# Patient Record
Sex: Male | Born: 1945 | Race: Black or African American | Hispanic: No | Marital: Married | Smoking: Former smoker
Health system: Southern US, Community
[De-identification: ages and names within clinical notes are randomized; demographics above are authoritative.]

## PROBLEM LIST (undated history)

## (undated) DIAGNOSIS — I10 Essential (primary) hypertension: Secondary | ICD-10-CM

## (undated) DIAGNOSIS — E119 Type 2 diabetes mellitus without complications: Secondary | ICD-10-CM

## (undated) DIAGNOSIS — E785 Hyperlipidemia, unspecified: Secondary | ICD-10-CM

## (undated) HISTORY — DX: Essential (primary) hypertension: I10

## (undated) HISTORY — DX: Hyperlipidemia, unspecified: E78.5

## (undated) HISTORY — DX: Type 2 diabetes mellitus without complications: E11.9

---

## 2012-07-04 ENCOUNTER — Emergency Department: Payer: Self-pay | Admitting: Emergency Medicine

## 2012-07-04 LAB — CBC
HCT: 38.5 % — ABNORMAL LOW (ref 40.0–52.0)
HGB: 12.9 g/dL — ABNORMAL LOW (ref 13.0–18.0)
MCH: 25.5 pg — ABNORMAL LOW (ref 26.0–34.0)
Platelet: 256 10*3/uL (ref 150–440)
RDW: 16.2 % — ABNORMAL HIGH (ref 11.5–14.5)

## 2012-09-03 ENCOUNTER — Encounter: Payer: Self-pay | Admitting: Nurse Practitioner

## 2012-09-03 ENCOUNTER — Encounter: Payer: Self-pay | Admitting: Cardiothoracic Surgery

## 2012-09-04 ENCOUNTER — Encounter: Payer: Self-pay | Admitting: Nurse Practitioner

## 2012-09-04 ENCOUNTER — Encounter: Payer: Self-pay | Admitting: Cardiothoracic Surgery

## 2012-10-02 ENCOUNTER — Encounter: Payer: Self-pay | Admitting: Nurse Practitioner

## 2012-10-02 ENCOUNTER — Encounter: Payer: Self-pay | Admitting: Cardiothoracic Surgery

## 2012-11-02 ENCOUNTER — Encounter: Payer: Self-pay | Admitting: Nurse Practitioner

## 2012-11-02 ENCOUNTER — Encounter: Payer: Self-pay | Admitting: Cardiothoracic Surgery

## 2012-11-14 LAB — WOUND CULTURE

## 2012-12-02 ENCOUNTER — Encounter: Payer: Self-pay | Admitting: Cardiothoracic Surgery

## 2012-12-02 ENCOUNTER — Encounter: Payer: Self-pay | Admitting: Nurse Practitioner

## 2013-01-02 ENCOUNTER — Encounter: Payer: Self-pay | Admitting: Nurse Practitioner

## 2013-01-02 ENCOUNTER — Encounter: Payer: Self-pay | Admitting: Cardiothoracic Surgery

## 2013-02-01 ENCOUNTER — Encounter: Payer: Self-pay | Admitting: Nurse Practitioner

## 2013-02-01 ENCOUNTER — Encounter: Payer: Self-pay | Admitting: Cardiothoracic Surgery

## 2013-03-04 ENCOUNTER — Encounter: Payer: Self-pay | Admitting: Cardiothoracic Surgery

## 2014-06-25 ENCOUNTER — Emergency Department: Payer: Self-pay | Admitting: Emergency Medicine

## 2014-07-10 ENCOUNTER — Encounter: Payer: Self-pay | Admitting: Surgery

## 2014-08-04 ENCOUNTER — Encounter: Payer: Self-pay | Admitting: Surgery

## 2014-08-10 DIAGNOSIS — I87311 Chronic venous hypertension (idiopathic) with ulcer of right lower extremity: Secondary | ICD-10-CM | POA: Diagnosis not present

## 2014-08-17 ENCOUNTER — Encounter: Payer: Self-pay | Admitting: Surgery

## 2014-08-17 DIAGNOSIS — M79609 Pain in unspecified limb: Secondary | ICD-10-CM | POA: Diagnosis not present

## 2014-08-17 DIAGNOSIS — L97309 Non-pressure chronic ulcer of unspecified ankle with unspecified severity: Secondary | ICD-10-CM | POA: Diagnosis not present

## 2014-08-17 DIAGNOSIS — I87311 Chronic venous hypertension (idiopathic) with ulcer of right lower extremity: Secondary | ICD-10-CM | POA: Diagnosis not present

## 2014-08-17 DIAGNOSIS — M7989 Other specified soft tissue disorders: Secondary | ICD-10-CM | POA: Diagnosis not present

## 2014-08-17 DIAGNOSIS — E11622 Type 2 diabetes mellitus with other skin ulcer: Secondary | ICD-10-CM | POA: Diagnosis not present

## 2014-08-17 DIAGNOSIS — I872 Venous insufficiency (chronic) (peripheral): Secondary | ICD-10-CM | POA: Diagnosis not present

## 2014-08-17 DIAGNOSIS — I890 Lymphedema, not elsewhere classified: Secondary | ICD-10-CM | POA: Diagnosis not present

## 2014-08-17 DIAGNOSIS — X35XXXA Volcanic eruption, initial encounter: Secondary | ICD-10-CM | POA: Diagnosis not present

## 2014-08-24 DIAGNOSIS — I87311 Chronic venous hypertension (idiopathic) with ulcer of right lower extremity: Secondary | ICD-10-CM | POA: Diagnosis not present

## 2014-08-30 ENCOUNTER — Emergency Department: Payer: Self-pay | Admitting: Emergency Medicine

## 2014-08-30 DIAGNOSIS — Z7982 Long term (current) use of aspirin: Secondary | ICD-10-CM | POA: Diagnosis not present

## 2014-08-30 DIAGNOSIS — I10 Essential (primary) hypertension: Secondary | ICD-10-CM | POA: Diagnosis not present

## 2014-08-30 DIAGNOSIS — R079 Chest pain, unspecified: Secondary | ICD-10-CM | POA: Diagnosis not present

## 2014-08-30 DIAGNOSIS — Z87891 Personal history of nicotine dependence: Secondary | ICD-10-CM | POA: Diagnosis not present

## 2014-08-30 DIAGNOSIS — R002 Palpitations: Secondary | ICD-10-CM | POA: Diagnosis not present

## 2014-08-30 DIAGNOSIS — E119 Type 2 diabetes mellitus without complications: Secondary | ICD-10-CM | POA: Diagnosis not present

## 2014-08-30 DIAGNOSIS — I455 Other specified heart block: Secondary | ICD-10-CM | POA: Diagnosis not present

## 2014-08-30 DIAGNOSIS — R0602 Shortness of breath: Secondary | ICD-10-CM | POA: Diagnosis not present

## 2014-08-30 DIAGNOSIS — Z79899 Other long term (current) drug therapy: Secondary | ICD-10-CM | POA: Diagnosis not present

## 2014-08-30 LAB — CBC
HCT: 40 % (ref 40.0–52.0)
HGB: 12.5 g/dL — AB (ref 13.0–18.0)
MCH: 23.9 pg — ABNORMAL LOW (ref 26.0–34.0)
MCHC: 31.4 g/dL — AB (ref 32.0–36.0)
MCV: 76 fL — ABNORMAL LOW (ref 80–100)
Platelet: 239 10*3/uL (ref 150–440)
RBC: 5.25 10*6/uL (ref 4.40–5.90)
RDW: 16.7 % — ABNORMAL HIGH (ref 11.5–14.5)
WBC: 8 10*3/uL (ref 3.8–10.6)

## 2014-08-30 LAB — CK TOTAL AND CKMB (NOT AT ARMC)
CK, Total: 82 U/L (ref 39–308)
CK-MB: 1.5 ng/mL (ref 0.5–3.6)

## 2014-08-30 LAB — COMPREHENSIVE METABOLIC PANEL
ALK PHOS: 91 U/L (ref 46–116)
ALT: 18 U/L (ref 14–63)
Albumin: 3.3 g/dL — ABNORMAL LOW (ref 3.4–5.0)
Anion Gap: 8 (ref 7–16)
BUN: 12 mg/dL (ref 7–18)
Bilirubin,Total: 0.4 mg/dL (ref 0.2–1.0)
CHLORIDE: 103 mmol/L (ref 98–107)
CO2: 27 mmol/L (ref 21–32)
CREATININE: 0.96 mg/dL (ref 0.60–1.30)
Calcium, Total: 8.7 mg/dL (ref 8.5–10.1)
Glucose: 198 mg/dL — ABNORMAL HIGH (ref 65–99)
OSMOLALITY: 281 (ref 275–301)
POTASSIUM: 3.5 mmol/L (ref 3.5–5.1)
SGOT(AST): 19 U/L (ref 15–37)
SODIUM: 138 mmol/L (ref 136–145)
Total Protein: 7.6 g/dL (ref 6.4–8.2)

## 2014-08-30 LAB — TROPONIN I

## 2014-08-30 LAB — PROTIME-INR
INR: 1
PROTHROMBIN TIME: 13.4 s (ref 11.5–14.7)

## 2014-08-30 LAB — APTT: ACTIVATED PTT: 30.2 s (ref 23.6–35.9)

## 2014-08-31 DIAGNOSIS — I87311 Chronic venous hypertension (idiopathic) with ulcer of right lower extremity: Secondary | ICD-10-CM | POA: Diagnosis not present

## 2014-09-01 DIAGNOSIS — G4733 Obstructive sleep apnea (adult) (pediatric): Secondary | ICD-10-CM | POA: Diagnosis not present

## 2014-09-04 ENCOUNTER — Encounter: Payer: Self-pay | Admitting: Surgery

## 2014-09-11 DIAGNOSIS — E11620 Type 2 diabetes mellitus with diabetic dermatitis: Secondary | ICD-10-CM | POA: Diagnosis not present

## 2014-09-11 DIAGNOSIS — I87311 Chronic venous hypertension (idiopathic) with ulcer of right lower extremity: Secondary | ICD-10-CM | POA: Diagnosis not present

## 2014-09-18 DIAGNOSIS — E11620 Type 2 diabetes mellitus with diabetic dermatitis: Secondary | ICD-10-CM | POA: Diagnosis not present

## 2014-09-18 DIAGNOSIS — I87311 Chronic venous hypertension (idiopathic) with ulcer of right lower extremity: Secondary | ICD-10-CM | POA: Diagnosis not present

## 2014-09-25 DIAGNOSIS — I87311 Chronic venous hypertension (idiopathic) with ulcer of right lower extremity: Secondary | ICD-10-CM | POA: Diagnosis not present

## 2014-09-25 DIAGNOSIS — E11622 Type 2 diabetes mellitus with other skin ulcer: Secondary | ICD-10-CM | POA: Diagnosis not present

## 2014-09-25 DIAGNOSIS — E11620 Type 2 diabetes mellitus with diabetic dermatitis: Secondary | ICD-10-CM | POA: Diagnosis not present

## 2014-10-02 DIAGNOSIS — E11620 Type 2 diabetes mellitus with diabetic dermatitis: Secondary | ICD-10-CM | POA: Diagnosis not present

## 2014-10-02 DIAGNOSIS — I87311 Chronic venous hypertension (idiopathic) with ulcer of right lower extremity: Secondary | ICD-10-CM | POA: Diagnosis not present

## 2014-10-02 DIAGNOSIS — G4733 Obstructive sleep apnea (adult) (pediatric): Secondary | ICD-10-CM | POA: Diagnosis not present

## 2014-10-03 ENCOUNTER — Encounter
Admit: 2014-10-03 | Disposition: A | Discharge status: Home / Self Care | Payer: Self-pay | Attending: Surgery | Admitting: Surgery

## 2014-10-09 DIAGNOSIS — I87311 Chronic venous hypertension (idiopathic) with ulcer of right lower extremity: Secondary | ICD-10-CM | POA: Diagnosis not present

## 2014-10-09 DIAGNOSIS — I87339 Chronic venous hypertension (idiopathic) with ulcer and inflammation of unspecified lower extremity: Secondary | ICD-10-CM | POA: Diagnosis not present

## 2014-10-09 DIAGNOSIS — E11620 Type 2 diabetes mellitus with diabetic dermatitis: Secondary | ICD-10-CM | POA: Diagnosis not present

## 2014-10-16 DIAGNOSIS — I87311 Chronic venous hypertension (idiopathic) with ulcer of right lower extremity: Secondary | ICD-10-CM | POA: Diagnosis not present

## 2014-10-16 DIAGNOSIS — E11620 Type 2 diabetes mellitus with diabetic dermatitis: Secondary | ICD-10-CM | POA: Diagnosis not present

## 2014-10-19 DIAGNOSIS — L03115 Cellulitis of right lower limb: Secondary | ICD-10-CM | POA: Diagnosis not present

## 2014-10-19 DIAGNOSIS — R011 Cardiac murmur, unspecified: Secondary | ICD-10-CM | POA: Diagnosis not present

## 2014-10-19 DIAGNOSIS — I10 Essential (primary) hypertension: Secondary | ICD-10-CM | POA: Diagnosis not present

## 2014-10-19 DIAGNOSIS — E782 Mixed hyperlipidemia: Secondary | ICD-10-CM | POA: Diagnosis not present

## 2014-10-19 DIAGNOSIS — E1165 Type 2 diabetes mellitus with hyperglycemia: Secondary | ICD-10-CM | POA: Diagnosis not present

## 2014-10-23 DIAGNOSIS — I87311 Chronic venous hypertension (idiopathic) with ulcer of right lower extremity: Secondary | ICD-10-CM | POA: Diagnosis not present

## 2014-10-23 DIAGNOSIS — E11620 Type 2 diabetes mellitus with diabetic dermatitis: Secondary | ICD-10-CM | POA: Diagnosis not present

## 2014-10-30 DIAGNOSIS — E11620 Type 2 diabetes mellitus with diabetic dermatitis: Secondary | ICD-10-CM | POA: Diagnosis not present

## 2014-10-30 DIAGNOSIS — I87311 Chronic venous hypertension (idiopathic) with ulcer of right lower extremity: Secondary | ICD-10-CM | POA: Diagnosis not present

## 2014-11-01 DIAGNOSIS — G4733 Obstructive sleep apnea (adult) (pediatric): Secondary | ICD-10-CM | POA: Diagnosis not present

## 2014-11-03 ENCOUNTER — Encounter
Admit: 2014-11-03 | Disposition: A | Discharge status: Home / Self Care | Payer: Self-pay | Attending: Surgery | Admitting: Surgery

## 2014-11-06 DIAGNOSIS — I87311 Chronic venous hypertension (idiopathic) with ulcer of right lower extremity: Secondary | ICD-10-CM | POA: Diagnosis not present

## 2014-11-06 DIAGNOSIS — E11620 Type 2 diabetes mellitus with diabetic dermatitis: Secondary | ICD-10-CM | POA: Diagnosis not present

## 2014-11-09 DIAGNOSIS — E11620 Type 2 diabetes mellitus with diabetic dermatitis: Secondary | ICD-10-CM | POA: Diagnosis not present

## 2014-11-09 DIAGNOSIS — I87311 Chronic venous hypertension (idiopathic) with ulcer of right lower extremity: Secondary | ICD-10-CM | POA: Diagnosis not present

## 2014-11-13 DIAGNOSIS — E11620 Type 2 diabetes mellitus with diabetic dermatitis: Secondary | ICD-10-CM | POA: Diagnosis not present

## 2014-11-13 DIAGNOSIS — I87311 Chronic venous hypertension (idiopathic) with ulcer of right lower extremity: Secondary | ICD-10-CM | POA: Diagnosis not present

## 2014-11-13 DIAGNOSIS — L97311 Non-pressure chronic ulcer of right ankle limited to breakdown of skin: Secondary | ICD-10-CM | POA: Diagnosis not present

## 2014-11-14 DIAGNOSIS — I87339 Chronic venous hypertension (idiopathic) with ulcer and inflammation of unspecified lower extremity: Secondary | ICD-10-CM | POA: Diagnosis not present

## 2014-11-16 DIAGNOSIS — I87311 Chronic venous hypertension (idiopathic) with ulcer of right lower extremity: Secondary | ICD-10-CM | POA: Diagnosis not present

## 2014-11-16 DIAGNOSIS — E11620 Type 2 diabetes mellitus with diabetic dermatitis: Secondary | ICD-10-CM | POA: Diagnosis not present

## 2014-11-20 DIAGNOSIS — I87311 Chronic venous hypertension (idiopathic) with ulcer of right lower extremity: Secondary | ICD-10-CM | POA: Diagnosis not present

## 2014-11-20 DIAGNOSIS — M79609 Pain in unspecified limb: Secondary | ICD-10-CM | POA: Diagnosis not present

## 2014-11-20 DIAGNOSIS — I872 Venous insufficiency (chronic) (peripheral): Secondary | ICD-10-CM | POA: Diagnosis not present

## 2014-11-20 DIAGNOSIS — I890 Lymphedema, not elsewhere classified: Secondary | ICD-10-CM | POA: Diagnosis not present

## 2014-11-20 DIAGNOSIS — L97309 Non-pressure chronic ulcer of unspecified ankle with unspecified severity: Secondary | ICD-10-CM | POA: Diagnosis not present

## 2014-11-20 DIAGNOSIS — E11620 Type 2 diabetes mellitus with diabetic dermatitis: Secondary | ICD-10-CM | POA: Diagnosis not present

## 2014-11-23 DIAGNOSIS — I87311 Chronic venous hypertension (idiopathic) with ulcer of right lower extremity: Secondary | ICD-10-CM | POA: Diagnosis not present

## 2014-11-23 DIAGNOSIS — E11620 Type 2 diabetes mellitus with diabetic dermatitis: Secondary | ICD-10-CM | POA: Diagnosis not present

## 2014-11-27 DIAGNOSIS — E11620 Type 2 diabetes mellitus with diabetic dermatitis: Secondary | ICD-10-CM | POA: Diagnosis not present

## 2014-11-27 DIAGNOSIS — I87311 Chronic venous hypertension (idiopathic) with ulcer of right lower extremity: Secondary | ICD-10-CM | POA: Diagnosis not present

## 2014-11-27 DIAGNOSIS — L97311 Non-pressure chronic ulcer of right ankle limited to breakdown of skin: Secondary | ICD-10-CM | POA: Diagnosis not present

## 2014-11-30 DIAGNOSIS — I87311 Chronic venous hypertension (idiopathic) with ulcer of right lower extremity: Secondary | ICD-10-CM | POA: Diagnosis not present

## 2014-11-30 DIAGNOSIS — E11620 Type 2 diabetes mellitus with diabetic dermatitis: Secondary | ICD-10-CM | POA: Diagnosis not present

## 2014-12-01 DIAGNOSIS — G4733 Obstructive sleep apnea (adult) (pediatric): Secondary | ICD-10-CM | POA: Diagnosis not present

## 2014-12-04 ENCOUNTER — Encounter: Payer: Medicare Other | Attending: Surgery | Admitting: Surgery

## 2014-12-04 DIAGNOSIS — E11620 Type 2 diabetes mellitus with diabetic dermatitis: Secondary | ICD-10-CM | POA: Insufficient documentation

## 2014-12-04 DIAGNOSIS — L97311 Non-pressure chronic ulcer of right ankle limited to breakdown of skin: Secondary | ICD-10-CM | POA: Diagnosis not present

## 2014-12-04 DIAGNOSIS — I87309 Chronic venous hypertension (idiopathic) without complications of unspecified lower extremity: Secondary | ICD-10-CM | POA: Diagnosis not present

## 2014-12-05 NOTE — Progress Notes (Signed)
Maurice Sanders, Bernhardt ([id number]) Visit Report for 12/04/2014 Chief Complaint Document Details Patient Name: Maurice Sanders, Maurice Sanders Date of Service: 12/04/2014 8:00 AM Medical Record Number: [id number] Patient Account Number: [id number] Date of Birth/Sex: 06/18/1946 (69 y.o. Male) Treating RN: Primary Care Physician: Maurice Sanders Other Clinician: Referring Physician: Daryel NovemberWILLIAMS, Sanders Treating Physician/Extender: Maurice Sanders Sanders in Treatment: 21 Information Obtained from: Patient Chief Complaint Maurice Sanders has been coming for dressing changes twice a week, on Monday and Thursday. All was looking good on Thursday but between Saturday and Sunday he started getting more edema and some shooting pain in his legs. Using the bandage but did not remove it. Electronic Signature(s) Signed: 12/04/2014 1:00:22 PM By: Evlyn KannerBritto, Deann Mclaine MD, FACS Entered By: Evlyn KannerBritto, Ingris Pasquarella on 12/04/2014 08:57:52 Maurice Sanders, Maurice Sanders ([id number]) -------------------------------------------------------------------------------- Debridement Details Patient Name: Maurice Sanders, Maurice Sanders Date of Service: 12/04/2014 8:00 AM Medical Record Number: [id number] Patient Account Number: [id number] Date of Birth/Sex: 02/04/1946 (68 y.o. Male) Treating RN: Primary Care Physician: Maurice Sanders Other Clinician: Referring Physician: Daryel NovemberWILLIAMS, Sanders Treating Physician/Extender: Maurice ReBritto, Derriana Oser Sanders in Treatment: 21 Debridement Performed for Wound #2 Right,Proximal,Medial Malleolus Assessment: Performed By: Physician Maurice SchroederBritto, Tafari Humiston J., MD Debridement: Open Wound/Selective Debridement Selective Description: Pre-procedure Yes Verification/Time Out Taken: Start Time: 08:36 Pain Control: Lidocaine 4% Topical Solution Level: Non-Viable Tissue Total Area Debrided (L x 10.5 (cm) x 8 (cm) = 84 (cm) W): Tissue and other Non-Viable, Eschar, Exudate, Fibrin/Slough, Skin material debrided: Instrument: Other : saline gauze Bleeding: Minimum Hemostasis Achieved:  Pressure End Time: 08:38 Procedural Pain: 0 Post Procedural Pain: 0 Response to Treatment: Procedure was tolerated well Post Debridement Measurements of Total Wound Length: (cm) 10.5 Width: (cm) 8 Depth: (cm) 0.2 Volume: (cm) 13.195 Electronic Signature(s) Signed: 12/04/2014 1:00:22 PM By: Evlyn KannerBritto, Danna Casella MD, FACS Entered By: Evlyn KannerBritto, Kahlan Engebretson on 12/04/2014 08:57:45 Maurice Sanders, Maurice Sanders ([id number]) -------------------------------------------------------------------------------- HPI Details Patient Name: Maurice Sanders, Maurice Sanders Date of Service: 12/04/2014 8:00 AM Medical Record Number: [id number] Patient Account Number: [id number] Date of Birth/Sex: 10/16/1945 59(68 y.o. Male) Treating RN: Primary Care Physician: Maurice Sanders Other Clinician: Referring Physician: Daryel NovemberWILLIAMS, Sanders Treating Physician/Extender: Maurice ReBritto, Shewanda Sharpe Sanders in Treatment: 21 History of Present Illness Location: right lower extremity Quality: Patient reports experiencing a dull pain to affected area(s). Severity: patient denies any severe pain. Duration: he has had this wound for a few Sanders now. Timing: Pain in wound is Intermittent (comes and goes Context: the patient has a long history of venous insufficiency. He has been very compliant with wearing his compression stockings. Modifying Factors: the patient normally wears compression stockings. Recently he had placed a fair amount of Vaseline which cause skin irritation. Associated Signs and Symptoms: Patient denies any fevers. He has had a moderate amount of drainage. HPI Description: The patient is 69 year-old male with a history of diabetes who presents with right lower extremity wound. He first presented to our clinic on 07/10/2014. He does have a history of venous insufficiency in the past. He has been very compliant with wearing his compression stockings. He recently Vaseline on his legs which caused skin irritation and breakdown. The patient returns for follow-up today. he  denies any fevers at home. However due to the fact that he had stopped his diuretic he has had a lot of edema of the lower extremity. He also has a lot of scaly skin on his right lower extremity. 10/02/14 -- he has resumed his diuretics on a regular basis and has been walking around adequately and has overall improved. No pain or no discharge from the wounds. 10/09/14 -- the patient has  been having some pain at night while sleeping and was requesting pain medications. He also says that due to taking his diuretics he's been getting some cramps and I have asked him to please see his PCP regarding both these problems. Also awaiting insurance clearance regarding his Apligraf. addendum: I have now got a clear copy of his vascular workup studies and have reviewed the entire details dated encounter 08/17/2014. the lower extremity venous duplex study done on 08/17/2014 revealed no evidence of deep vein thrombosis or superficial thrombophlebitis bilaterally, no incompetence of the greater small saphenous veins bilaterally, enlarged lymph nodes in the groin bilaterally. As seen by Dr. Gilda Crease on the same day. Dr. Gilda Crease recommended that he should continue wearing graduated compression stockings of class I which is 20-30 mmHg pressure on a daily basis and a prescription was given. He was to be reassessed in 6 months and the question of getting him lymphedema pumps were also discussed. 10/16/2014. Vascular note reviewed in detail. I have been able to review his notes from Dr. Gilda Crease on 08/17/2014. 1.The venous duplex study done on 08/17/2014 showed that all veins visualized showed no evidence of DVT. 2. no incompetence of the greater small saphenous veins bilaterally. Maurice Sanders, Maurice Sanders ([id number]) The plan was that he had no surgery or intervention needed at this point. The long discussion had showed that the patient had chronic skin changes accompanied by venous insufficiency and at this stage he  would benefit from wearing graduated compression stockings of the 20-30 mmHg on a daily basis and review would be done in his office back again in 6 months. At that time the patient could have a lymph pump. except for the new abrasion on the right lower extremity just below his tibial tuberosity he is doing fine and his ulcers actually look much better. 10/23/14 -- Culture report on Maurice Sanders --- His wound culture has grown Citrobacter koseri and Enterococcus faecalis. Final report is back but so far sensitive to Bactrim and ampicillin and ciprofloxacin. Bactrim DS was called in for 14 days. 11/13/2014 last week we changed his Unna boot on Monday and Thursday and this has helped him a lot. The edema has been much better the Unna boot hasn't slipped down and he has not had any skin abrasions. 11/20/2014 -- he saw the vascular surgeon Dr. Gilda Crease this morning and he was pleased with his progress and has said he would be ordering him some lymphedema pumps. Other than that the patient is coming to change his Unna's boots twice a week and he is doing well with this. 11/27/2014 -- over Saturday and Sunday he started having more swelling in his leg and some pain and I believe his own Unna's boot caused him some discomfort and excoriation of skin. He does admit he took some more juices orally but he has continued his diuretic. Electronic Signature(s) Signed: 12/04/2014 1:00:22 PM By: Evlyn Kanner MD, FACS Entered By: Evlyn Kanner on 12/04/2014 08:57:59 Maurice Sanders ([id number]) -------------------------------------------------------------------------------- Physical Exam Details Patient Name: Maurice Sanders Date of Service: 12/04/2014 8:00 AM Medical Record Number: [id number] Patient Account Number: [id number] Date of Birth/Sex: 1946/05/04 (68 y.o. Male) Treating RN: Primary Care Physician: Maurice Risen Other Clinician: Referring Physician: Daryel November Treating Physician/Extender:  Maurice Re in Treatment: 21 Constitutional . Pulse regular. Respirations normal and unlabored. Afebrile. . Eyes Nonicteric. Reactive to light. Ears, Nose, Mouth, and Throat Lips, teeth, and gums WNL.Marland Kitchen Moist mucosa without lesions . Neck supple and nontender. No palpable supraclavicular or cervical adenopathy.  Normal sized without goiter. Respiratory WNL. No retractions.. Cardiovascular Pedal Pulses WNL. lot of dry skin on his extremities but the edema has gone down significantly.. Integumentary (Hair, Skin) overall great improvement in his edema and the excoriation of his skin especially near the medial ankle on the right leg.Marland Kitchen Psychiatric Judgement and insight Intact.. No evidence of depression, anxiety, or agitation.. Electronic Signature(s) Signed: 12/04/2014 1:00:22 PM By: Evlyn Kanner MD, FACS Entered By: Evlyn Kanner on 12/04/2014 08:58:44 Maurice Sanders ([id number]) -------------------------------------------------------------------------------- Physician Orders Details Patient Name: Maurice Sanders Date of Service: 12/04/2014 8:00 AM Medical Record Number: [id number] Patient Account Number: [id number] Date of Birth/Sex: 1945/10/20 (68 y.o. Male) Treating RN: Renee Harder Primary Care Physician: Maurice Risen Other Clinician: Referring Physician: Daryel November Treating Physician/Extender: Maurice Re in Treatment: 31 Verbal / Phone Orders: Yes Clinician: Renee Harder Read Back and Verified: Yes Diagnosis Coding Wound Cleansing Wound #2 Right,Proximal,Medial Malleolus o May shower with protection. Wound #5 Right,Proximal,Anterior Lower Leg o May shower with protection. Anesthetic Wound #2 Right,Proximal,Medial Malleolus o Topical Lidocaine 4% cream applied to wound bed prior to debridement Wound #5 Right,Proximal,Anterior Lower Leg o Topical Lidocaine 4% cream applied to wound bed prior to debridement Skin Barriers/Peri-Wound Care Wound  #2 Right,Proximal,Medial Malleolus o Barrier cream o Moisturizing lotion Wound #5 Right,Proximal,Anterior Lower Leg o Barrier cream o Moisturizing lotion Primary Wound Dressing Wound #2 Right,Proximal,Medial Malleolus o Aquacel Ag Wound #5 Right,Proximal,Anterior Lower Leg o Prisma Ag Secondary Dressing Wound #2 Right,Proximal,Medial Malleolus o Drawtex Wound #5 Right,Proximal,Anterior Lower Leg o Dry Gauze Marchetti, Rinaldo ([id number]) Dressing Change Frequency Wound #2 Right,Proximal,Medial Malleolus o Dressing is to be changed Monday and Thursday. Follow-up Appointments Wound #2 Right,Proximal,Medial Malleolus o Return Appointment in 1 week. o Nurse Visit as needed - Thursday for dressing change Edema Control Wound #2 Right,Proximal,Medial Malleolus o 4-Layer Compression System - Right Lower Extremity Wound #5 Right,Proximal,Anterior Lower Leg o 4-Layer Compression System - Right Lower Extremity Medications-please add to medication list. Wound #2 Right,Proximal,Medial Malleolus o P.O. Antibiotics - continue doxycycline for 14 days starting 11/06/14 Electronic Signature(s) Signed: 12/04/2014 1:00:22 PM By: Evlyn Kanner MD, FACS Signed: 12/04/2014 5:06:07 PM By: Renee Harder RN Entered By: Renee Harder on 12/04/2014 08:37:11 Maurice Sanders ([id number]) -------------------------------------------------------------------------------- Problem List Details Patient Name: Maurice Sanders Date of Service: 12/04/2014 8:00 AM Medical Record Number: [id number] Patient Account Number: [id number] Date of Birth/Sex: 05-19-1946 (68 y.o. 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 Male) Treating RN: Primary Care Physician: Maurice Risen Other Clinician: Referring Physician: Daryel November Treating Physician/Extender: Maurice Re in Treatment: 21 Subjective Chief Complaint Information obtained from Patient Tarance has been coming for dressing changes twice a week, on Monday and Thursday. All was looking good on Thursday but between Saturday and Sunday he started getting more edema and some shooting pain in his legs. Using the bandage but did not remove it. History of Present Illness (HPI) The following HPI elements  were documented for the patient's wound: Location: right lower extremity Quality: Patient reports experiencing a dull pain to affected area(s). Severity: patient denies any severe pain. Duration: he has had this wound for a few Sanders now. Timing: Pain in wound is Intermittent (comes and goes Context: the patient has a long history of venous insufficiency. He has been very compliant with wearing his compression stockings. Modifying Factors: the patient normally wears compression stockings. Recently he had placed a fair amount of Vaseline which cause skin irritation. Associated Signs and Symptoms: Patient denies any fevers. He has had a moderate amount of drainage. The patient is 69 year-old male with a history of diabetes who presents with right lower extremity wound. He first  presented to our clinic on 07/10/2014. He does have a history of venous insufficiency in the past. He has been very compliant with wearing his compression stockings. He recently Vaseline on his legs which caused skin irritation and breakdown. The patient returns for follow-up today. he denies any fevers at home. However due to the fact that he had stopped his diuretic he has had a lot of edema of the lower extremity. He also has a lot of scaly skin on his right lower extremity. 10/02/14 -- he has resumed his diuretics on a regular basis and has been walking around adequately and has overall improved. No pain or no discharge from the wounds. 10/09/14 -- the patient has been having some pain at night while sleeping and was requesting pain medications. He also says that due to taking his diuretics he's been getting some cramps and I have asked him to please see his PCP regarding both these problems. Also awaiting insurance clearance regarding his Apligraf. addendum: I have now got a clear copy of his vascular workup studies and have reviewed the entire details dated encounter 08/17/2014. the lower extremity venous duplex study done on 08/17/2014 revealed no evidence of deep vein thrombosis or superficial thrombophlebitis bilaterally, no incompetence of the greater small saphenous veins bilaterally, enlarged lymph nodes in the groin bilaterally. As seen by Dr. Gilda Crease on the same day. Dr. Gilda Crease recommended that he should continue wearing Maurice Sanders, Maurice Sanders ([id number]) graduated compression stockings of class I which is 20-30 mmHg pressure on a daily basis and a prescription was given. He was to be reassessed in 6 months and the question of getting him lymphedema pumps were also discussed. 10/16/2014. Vascular note reviewed in detail. I have been able to review his notes from Dr. Gilda Crease on 08/17/2014. 1.The venous duplex study done on 08/17/2014 showed that all veins visualized showed no evidence  of DVT. 2. no incompetence of the greater small saphenous veins bilaterally. The plan was that he had no surgery or intervention needed at this point. The long discussion had showed that the patient had chronic skin changes accompanied by venous insufficiency and at this stage he would benefit from wearing graduated compression stockings of the 20-30 mmHg on a daily basis and review would be done in his office back again in 6 months. At that time the patient could have a lymph pump. except for the new abrasion on the right lower extremity just below his tibial tuberosity he is doing fine and his ulcers actually look much better. 10/23/14 -- Culture report on Maurice Sanders --- His wound culture has grown Citrobacter koseri and Enterococcus faecalis. Final report is back but so far sensitive to Bactrim and ampicillin and ciprofloxacin. Bactrim DS was called in for 14 days. 11/13/2014 last week we changed  his Unna boot on Monday and Thursday and this has helped him a lot. The edema has been much better the Unna boot hasn't slipped down and he has not had any skin abrasions. 11/20/2014 -- he saw the vascular surgeon Dr. Gilda Crease this morning and he was pleased with his progress and has said he would be ordering him some lymphedema pumps. Other than that the patient is coming to change his Unna's boots twice a week and he is doing well with this. 11/27/2014 -- over Saturday and Sunday he started having more swelling in his leg and some pain and I believe his own Unna's boot caused him some discomfort and excoriation of skin. He does admit he took some more juices orally but he has continued his diuretic. Objective Constitutional Pulse regular. Respirations normal and unlabored. Afebrile. Vitals Time Taken: 8:05 AM, Height: 71 in, Weight: 291.7 lbs, BMI: 40.7, Temperature: 97.7 F, Pulse: 69 bpm, Respiratory Rate: 12 breaths/min, Blood Pressure: 156/55 mmHg. Eyes Nonicteric. Reactive to  light. Maurice Sanders, Maurice Sanders ([id number]) Ears, Nose, Mouth, and Throat Lips, teeth, and gums WNL.Marland Kitchen Moist mucosa without lesions . Neck supple and nontender. No palpable supraclavicular or cervical adenopathy. Normal sized without goiter. Respiratory WNL. No retractions.. Cardiovascular Pedal Pulses WNL. lot of dry skin on his extremities but the edema has gone down significantly.Marland Kitchen Psychiatric Judgement and insight Intact.. No evidence of depression, anxiety, or agitation.. Integumentary (Hair, Skin) overall great improvement in his edema and the excoriation of his skin especially near the medial ankle on the right leg.. Wound #2 status is Open. Original cause of wound was Gradually Appeared. The wound is located on the Right,Proximal,Medial Malleolus. The wound measures 10.5cm length x 8cm width x 0.2cm depth; 65.973cm^2 area and 13.195cm^3 volume. The wound is limited to skin breakdown. There is no tunneling or undermining noted. There is a large amount of purulent drainage noted. The wound margin is distinct with the outline attached to the wound base. There is small (1-33%) pink granulation within the wound bed. There is a medium (34-66%) amount of necrotic tissue within the wound bed including Adherent Slough. The periwound skin appearance exhibited: Localized Edema, Scarring, Maceration, Moist, Hemosiderin Staining. The periwound skin appearance did not exhibit: Callus, Crepitus, Excoriation, Fluctuance, Friable, Induration, Rash, Dry/Scaly, Atrophie Blanche, Cyanosis, Ecchymosis, Mottled, Pallor, Rubor, Erythema. Periwound temperature was noted as No Abnormality. The periwound has tenderness on palpation. Wound #5 status is Open. Original cause of wound was Gradually Appeared. The wound is located on the Right,Proximal,Anterior Lower Leg. The wound measures 7cm length x 3.9cm width x 0.1cm depth; 21.441cm^2 area and 2.144cm^3 volume. The wound is limited to skin breakdown. There is no  tunneling or undermining noted. There is a small amount of serosanguineous drainage noted. The wound margin is distinct with the outline attached to the wound base. There is small (1-33%) red granulation within the wound bed. There is no necrotic tissue within the wound bed. The periwound skin appearance exhibited: Dry/Scaly, Hemosiderin Staining. The periwound skin appearance did not exhibit: Callus, Crepitus, Excoriation, Fluctuance, Friable, Induration, Localized Edema, Rash, Scarring, Maceration, Moist, Atrophie Blanche, Cyanosis, Ecchymosis, Mottled, Pallor, Rubor, Erythema. Periwound temperature was noted as No Abnormality. 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 Sugar  is also under control and he is watching his diet carefully. Procedures Wound #2 Wound #2 is a Venous Leg Ulcer located on the Right,Proximal,Medial Malleolus . There was a Non-Viable Tissue Open Wound/Selective [phone]) debridement with total area of 84 sq cm performed by Maurice Sanders., MD. with the following instrument(s): saline gauze to remove Non-Viable tissue/material including Exudate, Fibrin/Slough, Eschar, and Skin after achieving pain control using Lidocaine 4% Topical Solution. A time out was conducted prior to the start of the procedure. A Minimum amount of bleeding was controlled with Pressure. The procedure was tolerated well with a pain level of 0 throughout and a pain level of 0 following the procedure. Post Debridement Measurements: 10.5cm length x 8cm width x 0.2cm depth; 13.195cm^3 volume. Plan Wound Cleansing: Wound #2 Right,Proximal,Medial Malleolus: May shower with  protection. Wound #5 Right,Proximal,Anterior Lower Leg: May shower with protection. Anesthetic: Wound #2 Right,Proximal,Medial Malleolus: Topical Lidocaine 4% cream applied to wound bed prior to debridement Wound #5 Right,Proximal,Anterior Lower Leg: Topical Lidocaine 4% cream applied to wound bed prior to debridement Skin Barriers/Peri-Wound Care: Wound #2 Right,Proximal,Medial Malleolus: Barrier cream Moisturizing lotion Wound #5 Right,Proximal,Anterior Lower Leg: Maurice Sanders, Maurice Sanders ([id number]) Barrier cream Moisturizing lotion Primary Wound Dressing: Wound #2 Right,Proximal,Medial Malleolus: Aquacel Ag Wound #5 Right,Proximal,Anterior Lower Leg: Prisma Ag Secondary Dressing: Wound #2 Right,Proximal,Medial Malleolus: Drawtex Wound #5 Right,Proximal,Anterior Lower Leg: Dry Gauze Dressing Change Frequency: Wound #2 Right,Proximal,Medial Malleolus: Dressing is to be changed Monday and Thursday. Follow-up Appointments: Wound #2 Right,Proximal,Medial Malleolus: Return Appointment in 1 week. Nurse Visit as needed - Thursday for dressing change Edema Control: Wound #2 Right,Proximal,Medial Malleolus: 4-Layer Compression System - Right Lower Extremity Wound #5 Right,Proximal,Anterior Lower Leg: 4-Layer Compression System - Right Lower Extremity Medications-please add to medication list.: Wound #2 Right,Proximal,Medial Malleolus: P.O. Antibiotics - continue doxycycline for 14 days starting 11/06/14 We will continue with Prisma to his upper leg and silver alginate to his lower leg. He will also have a 4-layer compression wrap and is tolerating this well. We will see him back for a dressing change on Thursday. Electronic Signature(s) Signed: 12/04/2014 1:00:22 PM By: Evlyn Kanner MD, FACS Entered By: Evlyn Kanner on 12/04/2014 08:59:47

## 2014-12-05 NOTE — Progress Notes (Signed)
Maurice Sanders Sanders, Maurice Sanders Sanders ([id number]) Visit Report for 12/04/2014 Arrival Information Details Patient Name: Maurice Sanders Sanders, Maurice Sanders Sanders Date of Service: 12/04/2014 8:00 AM Medical Record Number: [id number] Patient Account Number: [id number] Date of Birth/Sex: 03/06/1946 (69 y.o. Male) Treating RN: Renee HarderMabry, Kelsey Primary Care Physician: Beverely RisenKHAN, FOZIA Other Clinician: Referring Physician: Daryel NovemberWILLIAMS, JONATHAN Treating Physician/Extender: Rudene ReBritto, Errol Weeks in Treatment: 21 Visit Information History Since Last Visit Added or deleted any medications: No Patient Arrived: Ambulatory Any new allergies or adverse reactions: No Arrival Time: 08:05 Had a fall or experienced change in No Accompanied By: self activities of daily living that may affect Transfer Assistance: None risk of falls: Patient Identification Verified: Yes Signs or symptoms of abuse/neglect since last No Secondary Verification Process Yes visito Completed: Hospitalized since last visit: No Patient Requires Transmission- No Has Dressing in Place as Prescribed: Yes Based Precautions: Has Compression in Place as Prescribed: Yes Patient Has Alerts: Yes Pain Present Now: No Patient Alerts: Patient on Blood Thinner DM II ABI L 1.06; R 1.13 Electronic Signature(s) Signed: 12/04/2014 5:06:07 PM By: Renee HarderMabry, Kelsey RN Entered By: Renee HarderMabry, Kelsey on 12/04/2014 08:07:26 Maurice Sanders Sanders, Maurice Sanders Sanders ([id number]) -------------------------------------------------------------------------------- Encounter Discharge Information Details Patient Name: Maurice Sanders Sanders, Maurice Sanders Sanders Date of Service: 12/04/2014 8:00 AM Medical Record Number: [id number] Patient Account Number: [id number] Date of Birth/Sex: 12/19/1945 (69 y.o. Male) Treating RN: Primary Care Physician: Beverely RisenKHAN, FOZIA Other Clinician: Referring Physician: Daryel NovemberWILLIAMS, JONATHAN Treating Physician/Extender: Rudene ReBritto, Errol Weeks in Treatment: 21 Encounter Discharge Information Items Schedule Follow-up Appointment: No Medication  Reconciliation completed No and provided to Patient/Care [name]: Provided on Clinical Summary of Care: 12/04/2014 Form Type Recipient Paper Patient AS Electronic Signature(s) Signed: 12/04/2014 8:54:17 AM By: Gwenlyn PerkingMoore, Shelia Entered By: Gwenlyn PerkingMoore, Shelia on 12/04/2014 08:54:17 Maurice Sanders Sanders, Maurice Sanders Sanders ([id number]) -------------------------------------------------------------------------------- Lower Extremity Assessment Details Patient Name: Maurice Sanders Sanders, Maurice Sanders Sanders Date of Service: 12/04/2014 8:00 AM Medical Record Number: [id number] Patient Account Number: [id number] Date of Birth/Sex: 11/14/1945 (69 y.o. Male) Treating RN: Renee HarderMabry, Kelsey Primary Care Physician: Beverely RisenKHAN, FOZIA Other Clinician: Referring Physician: Daryel NovemberWILLIAMS, JONATHAN Treating Physician/Extender: Rudene ReBritto, Errol Weeks in Treatment: 21 Edema Assessment Assessed: [Left: No] [Right: Yes] Edema: [Left: Ye] [Right: s] Calf Left: Right: Point of Measurement: 33 cm From Medial Instep cm 36.6 cm Ankle Left: Right: Point of Measurement: 11 cm From Medial Instep cm 27.6 cm Vascular Assessment Claudication: Claudication Assessment [Right:None] Pulses: Posterior Tibial Palpable: [Right:Yes] Dorsalis Pedis Palpable: [Right:Yes] Extremity colors, hair growth, and conditions: Hair Growth on Extremity: [Right:No] Temperature of Extremity: [Right:Warm] Capillary Refill: [Right:< 3 seconds] Dependent Rubor: [Right:No] Blanched when Elevated: [Right:No] Toe Nail Assessment Left: Right: Thick: No Discolored: No Deformed: No Improper Length and Hygiene: No Electronic Signature(sGlenford Sanders) Pfiester, Maurice Sanders ([id number]) Signed: 12/04/2014 5:06:07 PM By: Renee HarderMabry, Kelsey RN Entered By: Renee HarderMabry, Kelsey on 12/04/2014 08:13:38 Maurice Sanders Sanders, Maurice Sanders ([id number]) -------------------------------------------------------------------------------- Multi Wound Chart Details Patient Name: Maurice Sanders Sanders, Maurice Sanders Sanders Date of Service: 12/04/2014 8:00 AM Medical Record Number: [id number] Patient Account  Number: [id number] Date of Birth/Sex: 02/16/1946 (69 y.o. Male) Treating RN: Renee HarderMabry, Kelsey Primary Care Physician: Beverely RisenKHAN, FOZIA Other Clinician: Referring Physician: Daryel NovemberWILLIAMS, JONATHAN Treating Physician/Extender: Rudene ReBritto, Errol Weeks in Treatment: 21 Vital Signs Height(in): 71 Pulse(bpm): 69 Weight(lbs): 291.7 Blood Pressure 156/55 (mmHg): Body Mass Index(BMI): 41 Temperature(F): 97.7 Respiratory Rate 12 (breaths/min): Photos: [2:No Photos] [5:No Photos] [N/A:N/A] Wound Location: [2:Right Malleolus - Medial, Proximal] [5:Right Lower Leg - Anterior, Proximal] [N/A:N/A] Wounding Event: [2:Gradually Appeared] [5:Gradually Appeared] [N/A:N/A] Primary Etiology: [2:Venous Leg Ulcer] [5:Venous Leg Ulcer] [N/A:N/A] Comorbid History: [2:Sleep Apnea, Congestive Heart Failure, Deep Vein Thrombosis, Hypertension, Type II Diabetes, Gout] [5:Sleep Apnea, Congestive Heart Failure, Deep Vein  Thrombosis, Hypertension, Type II Diabetes, Gout] [N/A:N/A] Date Acquired: [2:07/31/2014] [5:10/11/2014] [N/A:N/A] Weeks of Treatment: [2:18] [5:7] [N/A:N/A] Wound Status: [2:Open] [5:Open] [N/A:N/A] Measurements L x W x D 10.5x8x0.2 [5:7x3.9x0.1] [N/A:N/A] (cm) Area (cm) : [2:65.973] [5:21.441] [N/A:N/A] Volume (cm) : [2:13.195] [5:2.144] [N/A:N/A] % Reduction in Area: [2:-9231.40%] [5:-101.80%] [N/A:N/A] % Reduction in Volume: -18484.50% [5:-101.70%] [N/A:N/A] Classification: [2:Full Thickness Without Exposed Support Structures] [5:Partial Thickness] [N/A:N/A] HBO Classification: [2:Grade 1] [5:Grade 1] [N/A:N/A] Exudate Amount: [2:Large] [5:Small] [N/A:N/A] Exudate Type: [2:Purulent] [5:Serosanguineous] [N/A:N/A] Exudate Color: [2:yellow, brown, green] [5:red, brown] [N/A:N/A] Wound Margin: [2:Distinct, outline attached] [5:Distinct, outline attached] [N/A:N/A] Granulation Amount: [2:Small (1-33%)] [5:Small (1-33%)] [N/A:N/A] Granulation Quality: [2:Pink] [5:Red] [N/A:N/A] Necrotic Amount: [2:Medium  (34-66%)] [5:None Present (0%)] [N/A:N/A] Exposed Structures: Fascia: No Fascia: No N/A Fat: No Fat: No Tendon: No Tendon: No Muscle: No Muscle: No Joint: No Joint: No Bone: No Bone: No Limited to Skin Limited to Skin Breakdown Breakdown Epithelialization: Medium (34-66%) Large (67-100%) N/A Periwound Skin Texture: Edema: Yes Edema: No N/A Scarring: Yes Excoriation: No Excoriation: No Induration: No Induration: No Callus: No Callus: No Crepitus: No Crepitus: No Fluctuance: No Fluctuance: No Friable: No Friable: No Rash: No Rash: No Scarring: No Periwound Skin Maceration: Yes Dry/Scaly: Yes N/A Moisture: Moist: Yes Maceration: No Dry/Scaly: No Moist: No Periwound Skin Color: Hemosiderin Staining: Yes Hemosiderin Staining: Yes N/A Atrophie Blanche: No Atrophie Blanche: No Cyanosis: No Cyanosis: No Ecchymosis: No Ecchymosis: No Erythema: No Erythema: No Mottled: No Mottled: No Pallor: No Pallor: No Rubor: No Rubor: No Temperature: No Abnormality No Abnormality N/A Tenderness on Yes No N/A Palpation: Wound Preparation: Ulcer Cleansing: Ulcer Cleansing: N/A Rinsed/Irrigated with Rinsed/Irrigated with Saline, Other: water and Saline soap Topical Anesthetic Topical Anesthetic Applied: None Applied: Other: lidocaine 4% Treatment Notes Electronic Signature(s) Signed: 12/04/2014 5:06:07 PM By: Renee Harder RN Entered By: Renee Harder on 12/04/2014 08:35:18 Maurice Sanders Sanders ([id number]) -------------------------------------------------------------------------------- Multi-Disciplinary Care Plan Details Patient Name: Maurice Sanders Sanders Date of Service: 12/04/2014 8:00 AM Medical Record Number: [id number] Patient Account Number: [id number] Date of Birth/Sex: 01-18-1946 (69 y.o. Male) Treating RN: Renee Harder Primary Care Physician: Beverely Risen Other Clinician: Referring Physician: Daryel November Treating Physician/Extender: Rudene Re in  Treatment: 21 Active Inactive Abuse / Safety / Falls / Self Care Management Nursing Diagnoses: Potential for falls Goals: Patient will remain injury free Date Initiated: 07/10/2014 Goal Status: Active Interventions: Assess fall risk on admission and as needed Notes: Nutrition Nursing Diagnoses: Potential for alteratiion in Nutrition/Potential for imbalanced nutrition Goals: Patient/caregiver agrees to and verbalizes understanding of need to use nutritional supplements and/or vitamins as prescribed Date Initiated: 07/10/2014 Goal Status: Active Interventions: Assess patient nutrition upon admission and as needed per policy Notes: Orientation to the Wound Care Program Nursing Diagnoses: Knowledge deficit related to the wound healing center program Goals: Patient/caregiver will verbalize understanding of the Wound Healing Center Program [place], [place] ([id number]) Date Initiated: 07/10/2014 Goal Status: Active Interventions: Provide education on orientation to the wound center Notes: Wound/Skin Impairment Nursing Diagnoses: Impaired tissue integrity Goals: Ulcer/skin breakdown will have a volume reduction of 30% by week 4 Date Initiated: 07/10/2014 Goal Status: Active Interventions: Assess ulceration(s) every visit Notes: Electronic Signature(s) Signed: 12/04/2014 5:06:07 PM By: Renee Harder RN Entered By: Renee Harder on 12/04/2014 08:35:10 Maurice Sanders Sanders ([id number]) -------------------------------------------------------------------------------- Pain Assessment Details Patient Name: Maurice Sanders Sanders Date of Service: 12/04/2014 8:00 AM Medical Record Number: [id number] Patient Account Number: [id number] Date of Birth/Sex: 1945-11-30 (69 y.o. Male) Treating RN: Renee Harder Primary Care Physician: Beverely Risen Other Clinician: Referring  Physician: Daryel November Treating Physician/Extender: Rudene Re in Treatment: 21 Active Problems Location of Pain  Severity and Description of Pain Patient Has Paino No Site Locations Pain Management and Medication Current Pain Management: Electronic Signature(s) Signed: 12/04/2014 5:06:07 PM By: Renee Harder RN Entered By: Renee Harder on 12/04/2014 08:07:32 Maurice Sanders Sanders ([id number]) -------------------------------------------------------------------------------- Patient/Caregiver Education Details Patient Name: Maurice Sanders Sanders Date of Service: 12/04/2014 8:00 AM Medical Record Number: [id number] Patient Account Number: [id number] Date of Birth/Gender: 05-Jul-1946 (69 y.o. Male) Treating RN: Renee Harder Primary Care Physician: Beverely Risen Other Clinician: Referring Physician: Daryel November Treating Physician/Extender: Rudene Re in Treatment: 21 Education Assessment Education Provided To: Patient Education Topics Provided Venous: Methods: Explain/Verbal Responses: State content correctly Wound Debridement: Methods: Explain/Verbal Responses: State content correctly Wound/Skin Impairment: Methods: Explain/Verbal Responses: State content correctly Electronic Signature(s) Signed: 12/04/2014 5:06:07 PM By: Renee Harder RN Entered By: Renee Harder on 12/04/2014 08:54:10 Maurice Sanders Sanders ([id number]) -------------------------------------------------------------------------------- Wound Assessment Details Patient Name: Maurice Sanders Sanders Date of Service: 12/04/2014 8:00 AM Medical Record Number: [id number] Patient Account Number: [id number] Date of Birth/Sex: 1946-07-15 (69 y.o. Male) Treating RN: Renee Harder Primary Care Physician: Beverely Risen Other Clinician: Referring Physician: Daryel November Treating Physician/Extender: Rudene Re in Treatment: 21 Wound Status Wound Number: 2 Primary Venous Leg Ulcer Etiology: Wound Location: Right Malleolus - Medial, Proximal Wound Open Status: Wounding Event: Gradually Appeared Comorbid Sleep Apnea, Congestive Heart  Failure, Date Acquired: 07/31/2014 History: Deep Vein Thrombosis, Hypertension, Weeks Of Treatment: 18 Type II Diabetes, Gout Clustered Wound: No Photos Photo Uploaded By: Renee Harder on 12/04/2014 17:03:41 Wound Measurements Length: (cm) 10.5 Width: (cm) 8 Depth: (cm) 0.2 Area: (cm) 65.973 Volume: (cm) 13.195 % Reduction in Area: -9231.4% % Reduction in Volume: -18484.5% Epithelialization: Medium (34-66%) Tunneling: No Undermining: No Wound Description Full Thickness Without Classification: Exposed Support Structures Diabetic Severity Grade 1 (Wagner): Maurice Sanders Sanders, Maurice Sanders Sanders ([id number]) Foul Odor After Cleansing: No Wound Margin: Distinct, outline attached Exudate Amount: Large Exudate Type: Purulent Exudate Color: yellow, brown, green Wound Bed Granulation Amount: Small (1-33%) Exposed Structure Granulation Quality: Pink Fascia Exposed: No Necrotic Amount: Medium (34-66%) Fat Layer Exposed: No Necrotic Quality: Adherent Slough Tendon Exposed: No Muscle Exposed: No Joint Exposed: No Bone Exposed: No Limited to Skin Breakdown Periwound Skin Texture Texture Color No Abnormalities Noted: No No Abnormalities Noted: No Callus: No Atrophie Blanche: No Crepitus: No Cyanosis: No Excoriation: No Ecchymosis: No Fluctuance: No Erythema: No Friable: No Hemosiderin Staining: Yes Induration: No Mottled: No Localized Edema: Yes Pallor: No Rash: No Rubor: No Scarring: Yes Temperature / Pain Moisture Temperature: No Abnormality No Abnormalities Noted: No Tenderness on Palpation: Yes Dry / Scaly: No Maceration: Yes Moist: Yes Wound Preparation Ulcer Cleansing: Rinsed/Irrigated with Saline, Other: water and soap, Topical Anesthetic Applied: Other: lidocaine 4%, Treatment Notes Wound #2 (Right, Proximal, Medial Malleolus) 1. Cleansed with: Clean wound with Normal Saline 2. Anesthetic Topical Lidocaine 4% cream to wound bed prior to debridement 3.  Peri-wound Care: Barrier cream Moisturizing lotion Maurice Sanders Sanders, Maurice Sanders Sanders ([id number]) 4. Dressing Applied: Aquacel Ag 5. Secondary Dressing Applied ABD and Kerlix/Conform 7. Secured with 4-Layer Compression System - Right Lower Extremity Electronic Signature(s) Signed: 12/04/2014 5:06:07 PM By: Renee Harder RN Entered By: Renee Harder on 12/04/2014 53:66:44 Maurice Sanders Sanders ([id number]) -------------------------------------------------------------------------------- Wound Assessment Details Patient Name: Maurice Sanders Sanders Date of Service: 12/04/2014 8:00 AM Medical Record Number: [id number] Patient Account Number: [id number] Date of Birth/Sex: [birth year] (69 y.o. Male) Treating RN: Renee Harder Primary Care Physician: Beverely Risen Other Clinician: Referring Physician: Daryel November Treating  Physician/Extender: Rudene Re in Treatment: 21 Wound Status Wound Number: 5 Primary Venous Leg Ulcer Etiology: Wound Location: Right Lower Leg - Anterior, Proximal Wound Open Status: Wounding Event: Gradually Appeared Comorbid Sleep Apnea, Congestive Heart Failure, Date Acquired: 10/11/2014 History: Deep Vein Thrombosis, Hypertension, Weeks Of Treatment: 7 Type II Diabetes, Gout Clustered Wound: No Photos Photo Uploaded By: Renee Harder on 12/04/2014 17:03:41 Wound Measurements Length: (cm) 7 Width: (cm) 3.9 Depth: (cm) 0.1 Area: (cm) 21.441 Volume: (cm) 2.144 % Reduction in Area: -101.8% % Reduction in Volume: -101.7% Epithelialization: Large (67-100%) Tunneling: No Undermining: No Wound Description Classification: Partial Thickness Foul Diabetic Severity (Wagner): Grade 1 Wound Margin: Distinct, outline attached Exudate Amount: Small Exudate Type: Serosanguineous Exudate Color: red, brown Odor After Cleansing: No Wound Bed Granulation Amount: Small (1-33%) Exposed Structure Granulation Quality: Red Fascia Exposed: No Romey, Chiron ([id number]) Necrotic Amount: None  Present (0%) Fat Layer Exposed: No Tendon Exposed: No Muscle Exposed: No Joint Exposed: No Bone Exposed: No Limited to Skin Breakdown Periwound Skin Texture Texture Color No Abnormalities Noted: No No Abnormalities Noted: No Callus: No Atrophie Blanche: No Crepitus: No Cyanosis: No Excoriation: No Ecchymosis: No Fluctuance: No Erythema: No Friable: No Hemosiderin Staining: Yes Induration: No Mottled: No Localized Edema: No Pallor: No Rash: No Rubor: No Scarring: No Temperature / Pain Moisture Temperature: No Abnormality No Abnormalities Noted: No Dry / Scaly: Yes Maceration: No Moist: No Wound Preparation Ulcer Cleansing: Rinsed/Irrigated with Saline Topical Anesthetic Applied: None Treatment Notes Wound #5 (Right, Proximal, Anterior Lower Leg) 1. Cleansed with: Clean wound with Normal Saline 2. Anesthetic Topical Lidocaine 4% cream to wound bed prior to debridement 3. Peri-wound Care: Moisturizing lotion 4. Dressing Applied: Prisma Ag 5. Secondary Dressing Applied Dry Gauze 7. Secured with 4-Layer Compression System - Right Lower Extremity Electronic Signature(s) Signed: 12/04/2014 5:06:07 PM By: Renee Harder RN [name], Andre ([id number]) Entered By: Renee Harder on 12/04/2014 08:25:22 Maurice Sanders Sanders ([id number]) -------------------------------------------------------------------------------- Vitals Details Patient Name: Maurice Sanders Sanders Date of Service: 12/04/2014 8:00 AM Medical Record Number: [id number] Patient Account Number: [id number] Date of Birth/Sex: 12/02/1945 (69 y.o. Male) Treating RN: Renee Harder Primary Care Physician: Beverely Risen Other Clinician: Referring Physician: Daryel November Treating Physician/Extender: Rudene Re in Treatment: 21 Vital Signs Time Taken: 08:05 Temperature (F): 97.7 Height (in): 71 Pulse (bpm): 69 Weight (lbs): 291.7 Respiratory Rate (breaths/min): 12 Body Mass Index (BMI): 40.7 Blood Pressure  (mmHg): 156/55 Reference Range: 80 - 120 mg / dl Electronic Signature(s) Signed: 12/04/2014 5:06:07 PM By: Renee Harder RN Entered By: Renee Harder on 12/04/2014 08:08:30

## 2014-12-07 DIAGNOSIS — I87309 Chronic venous hypertension (idiopathic) without complications of unspecified lower extremity: Secondary | ICD-10-CM | POA: Diagnosis not present

## 2014-12-07 DIAGNOSIS — E11620 Type 2 diabetes mellitus with diabetic dermatitis: Secondary | ICD-10-CM | POA: Diagnosis not present

## 2014-12-07 NOTE — Progress Notes (Addendum)
Maurice Sanders, Maurice Sanders ([id number]) Visit Report for 12/07/2014 Arrival Information Details Patient Name: Maurice Sanders, Maurice Sanders Date of Service: 12/07/2014 10:00 AM Medical Record Number: [id number] Patient Account Number: [id number] Date of Birth/Sex: 02/09/1946 (69 y.o. Male) Treating RN: Afful, RN, BSN, Dora Sinkita Primary Care Physician: Beverely RisenKHAN, FOZIA Other Clinician: Referring Physician: Daryel NovemberWILLIAMS, JONATHAN Treating Physician/Extender: Rudene ReBritto, Errol Weeks in Treatment: 21 Visit Information History Since Last Visit Any new allergies or adverse reactions: No Patient Arrived: Ambulatory Had a fall or experienced change in No Arrival Time: 10:10 activities of daily living that may affect Accompanied By: self risk of falls: Transfer Assistance: None Signs or symptoms of abuse/neglect since last No Patient Identification Verified: Yes visito Secondary Verification Process Yes Hospitalized since last visit: No Completed: Has Dressing in Place as Prescribed: Yes Patient Requires Transmission- No Pain Present Now: No Based Precautions: Patient Has Alerts: Yes Patient Alerts: Patient on Blood Thinner DM II ABI L 1.06; R 1.13 Electronic Signature(s) Signed: 12/07/2014 5:21:09 PM By: Elpidio EricAfful, Rita BSN, RN Entered By: Elpidio EricAfful, Rita on 12/07/2014 10:32:48 Maurice Sanders, Maurice Sanders ([id number]) -------------------------------------------------------------------------------- Encounter Discharge Information Details Patient Name: Maurice Sanders, Maurice Sanders Date of Service: 12/07/2014 10:00 AM Medical Record Number: [id number] Patient Account Number: [id number] Date of Birth/Sex: 01/30/1946 [age] y.o. Male) Treating RN: Clover MealyAfful, RN, BSN, [place] Sinkita Primary Care Physician: Beverely RisenKHAN, FOZIA Other Clinician: Referring Physician: Daryel NovemberWILLIAMS, JONATHAN Treating Physician/Extender: Rudene ReBritto, Errol Weeks in Treatment: 621 Encounter Discharge Information Items Discharge Pain Level: 0 Discharge Condition: Stable Ambulatory Status: Ambulatory Discharge Destination:  Home Transportation: Private Auto Accompanied By: self Schedule Follow-up Appointment: No Medication Reconciliation completed No and provided to Patient/Care Argie Lober: Patient Clinical Summary of Care: Declined Electronic Signature(s) Signed: 12/07/2014 5:21:09 PM By: Elpidio EricAfful, Rita BSN, RN Previous Signature: 12/07/2014 10:32:06 AM Version By: Gwenlyn PerkingMoore, Shelia Entered By: Elpidio EricAfful, Rita on 12/07/2014 10:34:49 Maurice Sanders, Maurice Sanders ([id number]) -------------------------------------------------------------------------------- Patient/Caregiver Education Details Patient Name: Maurice Sanders, Maurice Sanders Date of Service: 12/07/2014 10:00 AM Medical Record Number: [id number] Patient Account Number: [id number] Date of Birth/Gender: 08/20/1945 (68 y.o. Male) Treating RN: Clover MealyAfful, RN, BSN, [place] Sinkita Primary Care Physician: Beverely RisenKHAN, FOZIA Other Clinician: Referring Physician: Daryel NovemberWILLIAMS, JONATHAN Treating Physician/Extender: Rudene ReBritto, Errol Weeks in Treatment: 21 Education Assessment Education Provided To: Patient Education Topics Provided Basic Hygiene: Methods: Explain/Verbal Responses: State content correctly Wound/Skin Impairment: Methods: Explain/Verbal Responses: State content correctly Electronic Signature(s) Signed: 12/07/2014 5:21:09 PM By: Elpidio EricAfful, Rita BSN, RN Entered By: Elpidio EricAfful, Rita on 12/07/2014 10:34:25

## 2014-12-11 ENCOUNTER — Encounter: Payer: Medicare Other | Admitting: Surgery

## 2014-12-11 DIAGNOSIS — E11620 Type 2 diabetes mellitus with diabetic dermatitis: Secondary | ICD-10-CM | POA: Diagnosis not present

## 2014-12-11 DIAGNOSIS — I87309 Chronic venous hypertension (idiopathic) without complications of unspecified lower extremity: Secondary | ICD-10-CM | POA: Diagnosis not present

## 2014-12-12 NOTE — Progress Notes (Signed)
Maurice Sanders ([id number]) Visit Report for 12/11/2014 Chief Complaint Document Details Patient Name: Maurice Sanders, Maurice Sanders Date of Service: 12/11/2014 8:00 AM Medical Record Number: [id number] Patient Account Number: [id number] Date of Birth/Sex: Jun 30, 1946 (69 y.o. Male) Treating RN: Curtis Sites Primary Care Physician: Beverely Risen Other Clinician: Referring Physician: Daryel November Treating Physician/Extender: Rudene Re in Treatment: 22 Information Obtained from: Patient Chief Complaint [name] has been coming for dressing changes twice a week, on Monday and Thursday. Over all he is doing fine he has his blood sugar under control and is managing his diuretics well. Electronic SignatureSanderss) Signed: 12/11/2014 5:02:53 PM By: Evlyn Kanner MD, FACS Entered By: Evlyn Kanner on 12/11/2014 08:50:03 Maurice Sanders ([id number]) -------------------------------------------------------------------------------- Debridement Details Patient Name: Maurice Sanders Date of Service: 12/11/2014 8:00 AM Medical Record Number: [id number] Patient Account Number: [id number] Date of Birth/Sex: [birth year] (69 y.o. Male) Treating RN: Curtis Sites Primary Care Physician: Beverely Risen Other Clinician: Referring Physician: Daryel November Treating Physician/Extender: Rudene Re in Treatment: 22 Debridement Performed for Wound #2 Right,Proximal,Medial Malleolus Assessment: Performed By: Physician Tristan Schroeder., MD Debridement: Open Wound/Selective Debridement Selective Description: Pre-procedure Yes Verification/Time Out Taken: Start Time: 08:31 Pain Control: Lidocaine 4% Topical Solution Level: Non-Viable Tissue Total Area Debrided (L x 4 (cm) x 4 (cm) = 16 (cm) W): Tissue and other Non-Viable, Eschar, Fibrin/Slough material debrided: Instrument: Forceps Bleeding: None End Time: 08:37 Procedural Pain: 0 Post Procedural Pain: 0 Response to Treatment: Procedure was tolerated  well Post Debridement Measurements of Total Wound Length: (cm) 9 Width: (cm) 5 Depth: (cm) 0.1 Volume: (cm) 3.534 Electronic SignatureSanderss) Signed: 12/11/2014 5:02:53 PM By: Evlyn Kanner MD, FACS Signed: 12/11/2014 5:38:27 PM By: Curtis Sites Entered By: Evlyn Kanner on 12/11/2014 08:47:34 Maurice Sanders ([id number]) -------------------------------------------------------------------------------- Debridement Details Patient Name: Maurice Sanders Date of Service: 12/11/2014 8:00 AM Medical Record Number: [id number] Patient Account Number: [id number] Date of Birth/Sex: 04/24/1946 (69 y.o. Male) Treating RN: Curtis Sites Primary Care Physician: Beverely Risen Other Clinician: Referring Physician: Daryel November Treating Physician/Extender: Rudene Re in Treatment: 22 Debridement Performed for Wound #5 Right,Proximal,Anterior Lower Leg Assessment: Performed By: Physician Tristan Schroeder., MD Debridement: Debridement Pre-procedure Yes Verification/Time Out Taken: Start Time: 08:29 Pain Control: Lidocaine 4% Topical Solution Level: Skin/Subcutaneous Tissue Total Area Debrided (L x 3 (cm) x 4 (cm) = 12 (cm) W): Tissue and other Non-Viable, Eschar, Exudate, Fibrin/Slough material debrided: Instrument: Forceps Bleeding: None End Time: 08:31 Procedural Pain: 0 Post Procedural Pain: 0 Response to Treatment: Procedure was tolerated well Post Debridement Measurements of Total Wound Length: (cm) 7 Width: (cm) 4 Depth: (cm) 0.1 Volume: (cm) 2.199 Electronic SignatureSanderss) Signed: 12/11/2014 5:02:53 PM By: Evlyn Kanner MD, FACS Signed: 12/11/2014 5:38:27 PM By: Curtis Sites Entered By: Evlyn Kanner on 12/11/2014 08:48:15 Maurice Sanders ([id number]) -------------------------------------------------------------------------------- HPI Details Patient Name: Maurice Sanders Date of Service: 12/11/2014 8:00 AM Medical Record Number: [id number] Patient Account Number:  [id number] Date of Birth/Sex: 1946-04-03 (68 y.o. Male) Treating RN: Curtis Sites Primary Care Physician: Beverely Risen Other Clinician: Referring Physician: Daryel November Treating Physician/Extender: Rudene Re in Treatment: 22 History of Present Illness Location: right lower extremity Quality: Patient reports experiencing a dull pain to affected areaSanderss). Severity: patient denies any severe pain. Duration: he has had this wound for a few weeks now. Timing: Pain in wound is Intermittent (comes and goes Context: the patient has a long history of venous insufficiency. He has been very compliant with wearing his compression stockings. Modifying Factors: the patient normally wears compression stockings. Recently he  had placed a fair amount of Vaseline which cause skin irritation. Associated Signs and Symptoms: Patient denies any fevers. He has had a moderate amount of drainage. HPI Description: The patient is 69 year-old male with a history of diabetes who presents with right lower extremity wound. He first presented to our clinic on 07/10/2014. He does have a history of venous insufficiency in the past. He has been very compliant with wearing his compression stockings. He recently Vaseline on his legs which caused skin irritation and breakdown. The patient returns for follow-up today. he denies any fevers at home. However due to the fact that he had stopped his diuretic he has had a lot of edema of the lower extremity. He also has a lot of scaly skin on his right lower extremity. 10/02/14 -- he has resumed his diuretics on a regular basis and has been walking around adequately and has overall improved. No pain or no discharge from the wounds. 10/09/14 -- the patient has been having some pain at night while sleeping and was requesting pain medications. He also says that due to taking his diuretics he's been getting some cramps and I have asked him to please see his PCP regarding  both these problems. Also awaiting insurance clearance regarding his Apligraf. addendum: I have now got a clear copy of his vascular workup studies and have reviewed the entire details dated encounter 08/17/2014. the lower extremity venous duplex study done on 08/17/2014 revealed no evidence of deep vein thrombosis or superficial thrombophlebitis bilaterally, no incompetence of the greater small saphenous veins bilaterally, enlarged lymph nodes in the groin bilaterally. As seen by Dr. Gilda Crease on the same day. Dr. Gilda Crease recommended that he should continue wearing graduated compression stockings of class I which is 20-30 mmHg pressure on a daily basis and a prescription was given. He was to be reassessed in 6 months and the question of getting him lymphedema pumps were also discussed. 10/16/2014. Vascular note reviewed in detail. I have been able to review his notes from Dr. Gilda Crease on 08/17/2014. 1.The venous duplex study done on 08/17/2014 showed that all veins visualized showed no evidence of DVT. 2. no incompetence of the greater small saphenous veins bilaterally. Maurice Sanders, Maurice Sanders ([id number]) The plan was that he had no surgery or intervention needed at this point. The long discussion had showed that the patient had chronic skin changes accompanied by venous insufficiency and at this stage he would benefit from wearing graduated compression stockings of the 20-30 mmHg on a daily basis and review would be done in his office back again in 6 months. At that time the patient could have a lymph pump. except for the new abrasion on the right lower extremity just below his tibial tuberosity he is doing fine and his ulcers actually look much better. 10/23/14 -- Culture report on Maurice Sanders --- His wound culture has grown Citrobacter koseri and Enterococcus faecalis. Final report is back but so far sensitive to Bactrim and ampicillin and ciprofloxacin. Bactrim DS was called in for 14  days. 11/13/2014 last week we changed his Unna boot on Monday and Thursday and this has helped him a lot. The edema has been much better the Unna boot hasn't slipped down and he has not had any skin abrasions. 11/20/2014 -- he saw the vascular surgeon Dr. Gilda Crease this morning and he was pleased with his progress and has said he would be ordering him some lymphedema pumps. Other than that the patient is coming to change his Unna's  boots twice a week and he is doing well with this. 11/27/2014 -- over Saturday and Sunday he started having more swelling in his leg and some pain and I believe his own Unna's boot caused him some discomfort and excoriation of skin. He does admit he took some more juices orally but he has continued his diuretic. 12/11/2014 -- he has completed his course of antibiotics and has had no fresh issues. He has had no pain no swelling and is managing well with his blood sugars and diuretics. Electronic SignatureSanderss) Signed: 12/11/2014 5:02:53 PM By: Evlyn KannerBritto, Kaulana Brindle MD, FACS Entered By: Evlyn KannerBritto, Alvenia Treese on 12/11/2014 08:50:39 Maurice Sanders, Maurice Sanders ([id number]) -------------------------------------------------------------------------------- Physical Exam Details Patient Name: Maurice Sanders, Maurice Sanders Date of Service: 12/11/2014 8:00 AM Medical Record Number: [id number] Patient Account Number: [id number] Date of Birth/Sex: 12/14/1945 (68 y.o. Male) Treating RN: Curtis Sitesorthy, Joanna Primary Care Physician: Beverely RisenKHAN, FOZIA Other Clinician: Referring Physician: Daryel NovemberWILLIAMS, JONATHAN Treating Physician/Extender: Rudene ReBritto, Lataisha Colan Weeks in Treatment: 22 Constitutional . Pulse regular. Respirations normal and unlabored. Afebrile. . Eyes Nonicteric. Reactive to light. Ears, Nose, Mouth, and Throat Lips, teeth, and gums WNL.Marland Kitchen. Moist mucosa without lesions . Neck supple and nontender. No palpable supraclavicular or cervical adenopathy. Normal sized without goiter. Respiratory WNL. No retractions.. Cardiovascular Pedal  Pulses WNL. edema is much better and overall the wound and the lower extremity on the right side looks much better.. Integumentary (Hair, Skin) the ulcerated area has some fibrin and exudate which have been able to remove and under that there is good healing.Marland Kitchen. Psychiatric Judgement and insight Intact.. No evidence of depression, anxiety, or agitation.. Electronic SignatureSanderss) Signed: 12/11/2014 5:02:53 PM By: Evlyn KannerBritto, Kimberle Stanfill MD, FACS Entered By: Evlyn KannerBritto, Lanora Reveron on 12/11/2014 08:51:45 Maurice Sanders, Maurice Sanders ([id number]) -------------------------------------------------------------------------------- Physician Orders Details Patient Name: Maurice Sanders, Maurice Sanders Date of Service: 12/11/2014 8:00 AM Medical Record Number: [id number] Patient Account Number: [id number] Date of Birth/Sex: 08/25/1945 (68 y.o. Male) Treating RN: Curtis Sitesorthy, Joanna Primary Care Physician: Beverely RisenKHAN, FOZIA Other Clinician: Referring Physician: Daryel NovemberWILLIAMS, JONATHAN Treating Physician/Extender: Rudene ReBritto, Eliany Mccarter Weeks in Treatment: [id number] Verbal / Phone Orders: Yes Clinician: Curtis Sitesorthy, Joanna Read Back and Verified: Yes Diagnosis Coding Wound Cleansing Wound #2 Right,Proximal,Medial Malleolus o May shower with protection. Wound #5 Right,Proximal,Anterior Lower Leg o May shower with protection. Anesthetic Wound #2 Right,Proximal,Medial Malleolus o Topical Lidocaine 4% cream applied to wound bed prior to debridement Wound #5 Right,Proximal,Anterior Lower Leg o Topical Lidocaine 4% cream applied to wound bed prior to debridement Skin Barriers/Peri-Wound Care Wound #2 Right,Proximal,Medial Malleolus o Barrier cream o Moisturizing lotion Wound #5 Right,Proximal,Anterior Lower Leg o Barrier cream o Moisturizing lotion Primary Wound Dressing Wound #2 Right,Proximal,Medial Malleolus o Aquacel Ag Wound #5 Right,Proximal,Anterior Lower Leg o Prisma Ag Secondary Dressing Wound #2 Right,Proximal,Medial Malleolus o Drawtex Wound #5  Right,Proximal,Anterior Lower Leg o Drawtex Chea, Maurice [id number]) Dressing Change Frequency Wound #2 Right,Proximal,Medial Malleolus o Dressing is to be changed Monday and Thursday. Wound #5 Right,Proximal,Anterior Lower Leg o Dressing is to be changed Monday and Thursday. Follow-up Appointments Wound #2 Right,Proximal,Medial Malleolus o Return Appointment in 1 week. o Nurse Visit as needed - Thursday for dressing change Wound #5 Right,Proximal,Anterior Lower Leg o Return Appointment in 1 week. o Nurse Visit as needed - Thursday for dressing change Edema Control Wound #2 Right,Proximal,Medial Malleolus o 4-Layer Compression System - Right Lower Extremity Wound #5 Right,Proximal,Anterior Lower Leg o 4-Layer Compression System - Right Lower Extremity Electronic SignatureSanderss) Signed: 12/11/2014 5:02:53 PM By: Evlyn KannerBritto, Robinn Overholt MD, FACS Signed: 12/11/2014 5:38:27 PM By: Curtis Sitesorthy, Joanna Entered By: Curtis Sitesorthy, Joanna on 12/11/2014 08:35:19  Maurice Sanders, Maurice Sanders ([id number]) -------------------------------------------------------------------------------- Problem List Details Patient Name: MICK, TANGUMA Date of Service: 12/11/2014 8:00 AM Medical Record Number: [id number] Patient Account Number: [id number] Date of Birth/Sex: Nov 27, 1945 (69 y.o. 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 Male) Treating RN: Curtis Sites Primary Care Physician: Beverely Risen Other Clinician: Referring Physician: Daryel November Treating Physician/Extender: Rudene Re in Treatment: 22 Subjective Chief Complaint Information obtained from Patient Ebony has been coming for dressing changes twice a week, on Monday and Thursday. Over all he is doing fine he has his blood sugar under control and is managing his diuretics well. History of Present Illness (HPI) The following HPI elements were documented for the patient's wound: Location: right lower extremity Quality: Patient reports experiencing a dull pain to affected areaSanderss). Severity: patient denies any severe pain. Duration: he has had this wound for a few weeks now. Timing: Pain in wound is Intermittent (comes and goes Context: the patient has a long history of venous insufficiency. He has been very compliant with wearing his compression stockings. Modifying Factors: the patient normally wears compression stockings. Recently he had placed a fair amount of Vaseline which cause skin irritation. Associated Signs and Symptoms: Patient denies any fevers. He has had a moderate amount of drainage. The patient is 69 year-old male with a history of diabetes who presents with right lower extremity wound. He first presented to our clinic on 07/10/2014. He does have a history of venous insufficiency in the past. He has been very compliant with wearing his compression stockings. He recently Vaseline on his legs which caused skin irritation and breakdown. The patient returns for follow-up today. he denies any fevers at home. However due to the fact that he  had stopped his diuretic he has had a lot of edema of the lower extremity. He also has a lot of scaly skin on his right lower extremity. 10/02/14 -- he has resumed his diuretics on a regular basis and has been walking around adequately and has overall improved. No pain or no discharge from the wounds. 10/09/14 -- the patient has been having some pain at night while sleeping and was requesting pain medications. He also says that due to taking his diuretics he's been getting some cramps and I have asked him to please see his PCP regarding both these problems. Also awaiting insurance clearance regarding his Apligraf. addendum: I have now got a clear copy of his vascular workup studies and have reviewed the entire details dated encounter 08/17/2014. the lower extremity venous duplex study done on 08/17/2014 revealed no evidence of deep vein thrombosis or superficial thrombophlebitis bilaterally, no incompetence of the greater small saphenous veins bilaterally, enlarged lymph nodes in the groin bilaterally. As seen by Dr. Gilda Crease on the same day. Dr. Gilda Crease recommended that he should continue wearing graduated compression stockings of class I which is 20-30 mmHg pressure on a daily basis and a Sirianni, Maurice Sanders (  [id number]) prescription was given. He was to be reassessed in 6 months and the question of getting him lymphedema pumps were also discussed. 10/16/2014. Vascular note reviewed in detail. I have been able to review his notes from Dr. Gilda Crease on 08/17/2014. 1.The venous duplex study done on 08/17/2014 showed that all veins visualized showed no evidence of DVT. 2. no incompetence of the greater small saphenous veins bilaterally. The plan was that he had no surgery or intervention needed at this point. The long discussion had showed that the patient had chronic skin changes accompanied by venous insufficiency and at this stage he would benefit from wearing graduated compression stockings of the  20-30 mmHg on a daily basis and review would be done in his office back again in 6 months. At that time the patient could have a lymph pump. except for the new abrasion on the right lower extremity just below his tibial tuberosity he is doing fine and his ulcers actually look much better. 10/23/14 -- Culture report on Maurice Sanders --- His wound culture has grown Citrobacter koseri and Enterococcus faecalis. Final report is back but so far sensitive to Bactrim and ampicillin and ciprofloxacin. Bactrim DS was called in for 14 days. 11/13/2014 last week we changed his Unna boot on Monday and Thursday and this has helped him a lot. The edema has been much better the Unna boot hasn't slipped down and he has not had any skin abrasions. 11/20/2014 -- he saw the vascular surgeon Dr. Gilda Crease this morning and he was pleased with his progress and has said he would be ordering him some lymphedema pumps. Other than that the patient is coming to change his Unna's boots twice a week and he is doing well with this. 11/27/2014 -- over Saturday and Sunday he started having more swelling in his leg and some pain and I believe his own Unna's boot caused him some discomfort and excoriation of skin. He does admit he took some more juices orally but he has continued his diuretic. 12/11/2014 -- he has completed his course of antibiotics and has had no fresh issues. He has had no pain no swelling and is managing well with his blood sugars and diuretics. Objective Constitutional Pulse regular. Respirations normal and unlabored. Afebrile. Vitals Time Taken: 8:08 AM, Height: 71 in, Weight: 291.7 lbs, BMI: 40.7, Temperature: 97.7 F, Pulse: 66 bpm, Respiratory Rate: 18 breaths/min, Blood Pressure: 179/62 mmHg. Maurice Sanders, Maurice Sanders ([id number]) Eyes Nonicteric. Reactive to light. Ears, Nose, Mouth, and Throat Lips, teeth, and gums WNL.Marland Kitchen Moist mucosa without lesions . Neck supple and nontender. No palpable supraclavicular  or cervical adenopathy. Normal sized without goiter. Respiratory WNL. No retractions.. Cardiovascular Pedal Pulses WNL. edema is much better and overall the wound and the lower extremity on the right side looks much better.Marland Kitchen Psychiatric Judgement and insight Intact.. No evidence of depression, anxiety, or agitation.. Integumentary (Hair, Skin) the ulcerated area has some fibrin and exudate which have been able to remove and under that there is good healing.. Wound #2 status is Open. Original cause of wound was Gradually Appeared. The wound is located on the Right,Proximal,Medial Malleolus. The wound measures 9cm length x 5cm width x 0.1cm depth; 35.343cm^2 area and 3.534cm^3 volume. The wound is limited to skin breakdown. There is a large amount of purulent drainage noted. The wound margin is distinct with the outline attached to the wound base. There is medium (34-66%) red, pink granulation within the wound bed. There is a small (1-33%) amount of necrotic tissue  within the wound bed including Adherent Slough. The periwound skin appearance exhibited: Localized Edema, Scarring, Maceration, Moist, Hemosiderin Staining. The periwound skin appearance did not exhibit: Callus, Crepitus, Excoriation, Fluctuance, Friable, Induration, Rash, Dry/Scaly, Atrophie Blanche, Cyanosis, Ecchymosis, Mottled, Pallor, Rubor, Erythema. Periwound temperature was noted as No Abnormality. The periwound has tenderness on palpation. Wound #5 status is Open. Original cause of wound was Gradually Appeared. The wound is located on the Right,Proximal,Anterior Lower Leg. The wound measures 7cm length x 4cm width x 0.1cm depth; 21.991cm^2 area and 2.199cm^3 volume. The wound is limited to skin breakdown. There is a small amount of serosanguineous drainage noted. The wound margin is distinct with the outline attached to the wound base. There is medium (34-66%) red granulation within the wound bed. There is a medium  (34-66%) amount of necrotic tissue within the wound bed including Eschar. The periwound skin appearance exhibited: Dry/Scaly, Hemosiderin Staining. The periwound skin appearance did not exhibit: Callus, Crepitus, Excoriation, Fluctuance, Friable, Induration, Localized Edema, Rash, Scarring, Maceration, Moist, Atrophie Blanche, Cyanosis, Ecchymosis, Mottled, Pallor, Rubor, Erythema. Periwound temperature was noted as No Abnormality. 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 There was a Non-Viable Tissue Open Wound/Selective [id number]) debridement with total area of 16 sq cm performed by Desira Alessandrini, Ignacia FellingErrol J., MD. with the following instrumentSanderss): Forceps to remove Non-Viable tissue/material including Fibrin/Slough and Eschar after achieving pain control using Lidocaine 4% Topical Solution. A time out was conducted prior to the start of the procedure. There was no bleeding. The procedure was tolerated well with a pain level of 0 throughout and a pain level of 0 following the procedure. Post Debridement Measurements: 9cm length x 5cm width x 0.1cm depth; 3.534cm^3 volume. Wound #5 Wound #5 is a Venous Leg Ulcer located on the Right,Proximal,Anterior Lower Leg . There was a Skin/Subcutaneous Tissue Debridement ([id number]) debridement with total area of 12 sq cm performed by Kindrick Lankford, Ignacia FellingErrol J., MD. with the following instrumentSanderss): Forceps to remove Non-Viable tissue/material including Exudate, Fibrin/Slough, and Eschar after achieving pain control using Lidocaine 4% Topical Solution. A time out was conducted prior to the start of the procedure. There was no bleeding. The procedure was tolerated well with a pain level of 0 throughout  and a pain level of 0 following the procedure. Post Debridement Measurements: 7cm length x 4cm width x 0.1cm depth; 2.199cm^3 volume. Plan Wound Cleansing: Wound #2 Right,Proximal,Medial Malleolus: May shower with protection. Wound #5 Right,Proximal,Anterior Lower Leg: May shower with protection. Anesthetic: Wound #2 Right,Proximal,Medial Malleolus: Topical Lidocaine 4% cream applied to wound bed prior to debridement Wound #5 Right,Proximal,Anterior Lower Leg: Topical Lidocaine 4% cream applied to wound bed prior to debridement Skin Barriers/Peri-Wound Care: Wound #2 Right,Proximal,Medial Malleolus: Maurice Sanders, Maurice Sanders ([id number]) Barrier cream Moisturizing lotion Wound #5 Right,Proximal,Anterior Lower Leg: Barrier cream Moisturizing lotion Primary Wound Dressing: Wound #2 Right,Proximal,Medial Malleolus: Aquacel Ag Wound #5 Right,Proximal,Anterior Lower Leg: Prisma Ag Secondary Dressing: Wound #2 Right,Proximal,Medial Malleolus: Drawtex Wound #5 Right,Proximal,Anterior Lower Leg: Drawtex Dressing Change Frequency: Wound #2 Right,Proximal,Medial Malleolus: Dressing is to be changed Monday and Thursday. Wound #5 Right,Proximal,Anterior Lower Leg: Dressing is to be changed Monday and Thursday. Follow-up Appointments: Wound #2 Right,Proximal,Medial Malleolus: Return Appointment in 1 week. Nurse Visit as needed - Thursday for dressing change Wound #5 Right,Proximal,Anterior Lower Leg: Return Appointment in 1 week.  Nurse Visit as needed - Thursday for dressing change Edema Control: Wound #2 Right,Proximal,Medial Malleolus: 4-Layer Compression System - Right Lower Extremity Wound #5 Right,Proximal,Anterior Lower Leg: 4-Layer Compression System - Right Lower Extremity we will continue with twice a week changes of dressing and he will continue to be proactive in managing his edema and also elevation of the limb when he is not ambulant. We are using Prisma in the upper part  and calcium alginate with silver in the lower part and using a 4 layer compression. He'll come back and see as next week. Electronic SignatureSanderss) Signed: 12/11/2014 5:02:53 PM By: Evlyn Kanner MD, FACS Entered By: Evlyn Kanner on 12/11/2014 08:53:04 Maurice Sanders ([id number]) -------------------------------------------------------------------------------- SuperBill Details Patient Name: Maurice Sanders Date of Service: 12/11/2014 Medical Record Number: [id number] Patient Account Number: [id number] Date of Birth/Sex: [birth year] (69 y.o. 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 Modifier: f right lowe Quantity: 1 r extremity CPT4 Code Description: 36644034 97597 - DEBRIDE WOUND 1ST 20 SQ CM OR < ICD-10 Description Diagnosis I87.311 Chronic venous hypertension (idiopathic) with ulcer o 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 Modifier: f right lowe Quantity: 1 r extremity Physician Procedures CPT4 Code Description: 7425956 11042 - WC PHYS SUBQ TISS 20 SQ CM ICD-10 Description Diagnosis I87.311 Chronic venous hypertension (idiopathic) with ulcer o 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

## 2014-12-12 NOTE — Progress Notes (Signed)
CASTLE, LAMONS ([id number]) Visit Report for 12/11/2014 Arrival Information Details Patient Name: Maurice Sanders, Maurice Sanders Date of Service: 12/11/2014 8:00 AM Medical Record Number: [id number] Patient Account Number: [id number] Date of Birth/Sex: Aug 14, 1945 (69 y.o. Male) Treating RN: Huel Coventry Primary Care Physician: Beverely Risen Other Clinician: Referring Physician: Daryel November Treating Physician/Extender: Rudene Re in Treatment: 22 Visit Information History Since Last Visit Added or deleted any medications: No Patient Arrived: Ambulatory Any new allergies or adverse reactions: No Arrival Time: 08:08 Had a fall or experienced change in No Accompanied By: self activities of daily living that may affect Transfer Assistance: None risk of falls: Patient Identification Verified: Yes Signs or symptoms of abuse/neglect since last No Secondary Verification Process Yes visito Completed: Hospitalized since last visit: No Patient Requires Transmission- No Has Dressing in Place as Prescribed: Yes Based Precautions: Has Compression in Place as Prescribed: Yes Patient Has Alerts: Yes Pain Present Now: No Patient Alerts: Patient on Blood Thinner DM II ABI L 1.06; R 1.13 Electronic Signature(s) Signed: 12/11/2014 8:58:38 AM By: Elliot Gurney, RN, BSN, Kim RN, BSN Entered By: Elliot Gurney, RN, BSN, Kim on 12/11/2014 08:08:32 Maurice Bayley ([id number]) -------------------------------------------------------------------------------- Encounter Discharge Information Details Patient Name: Maurice Bayley Date of Service: 12/11/2014 8:00 AM Medical Record Number: [id number] Patient Account Number: [id number] Date of Birth/Sex: May 28, 1946 (68 y.o. Male) Treating RN: Huel Coventry Primary Care Physician: Beverely Risen Other Clinician: Referring Physician: Daryel November Treating Physician/Extender: Rudene Re in Treatment: 5 Encounter Discharge Information Items Discharge Pain Level: 0 Discharge  Condition: Stable Ambulatory Status: Ambulatory Discharge Destination: Home Transportation: Private Auto Accompanied By: self Schedule Follow-up Appointment: Yes Medication Reconciliation completed and provided to Patient/Care Yes Matteo Banke: Provided on Clinical Summary of Care: 12/11/2014 Form Type Recipient Paper Patient AS Electronic Signature(s) Signed: 12/11/2014 8:53:18 AM By: Gwenlyn Perking Entered By: Gwenlyn Perking on 12/11/2014 08:53:18 Maurice Bayley ([id number]) -------------------------------------------------------------------------------- Lower Extremity Assessment Details Patient Name: Maurice Bayley Date of Service: 12/11/2014 8:00 AM Medical Record Number: [id number] Patient Account Number: [id number] Date of Birth/Sex: [birth year] (68 y.o. Male) Treating RN: Huel Coventry Primary Care Physician: Beverely Risen Other Clinician: Referring Physician: Daryel November Treating Physician/Extender: Rudene Re in Treatment: 22 Edema Assessment Assessed: [Left: No] [Right: No] E[Left: dema] [Right: :] Calf Left: Right: Point of Measurement: 33 cm From Medial Instep cm 36.8 cm Ankle Left: Right: Point of Measurement: 11 cm From Medial Instep cm 26.5 cm Vascular Assessment Pulses: Posterior Tibial Dorsalis Pedis Palpable: [Right:Yes] Extremity colors, hair growth, and conditions: Extremity Color: [Right:Hyperpigmented] Hair Growth on Extremity: [Right:No] Temperature of Extremity: [Right:Warm] Capillary Refill: [Right:< 3 seconds] Toe Nail Assessment Left: Right: Thick: Yes Discolored: Yes Deformed: No Improper Length and Hygiene: No Electronic Signature(s) Signed: 12/11/2014 8:58:38 AM By: Elliot Gurney, RN, BSN, Kim RN, BSN Entered By: Elliot Gurney, RN, BSN, Kim on 12/11/2014 08:22:15 Maurice Bayley ([id number]) -------------------------------------------------------------------------------- Multi Wound Chart Details Patient Name: Maurice Bayley Date of Service: 12/11/2014  8:00 AM Medical Record Number: [id number] Patient Account Number: [id number] Date of Birth/Sex: 14-Mar-1946 (68 y.o. Male) Treating RN: Curtis Sites Primary Care Physician: Beverely Risen Other Clinician: Referring Physician: Daryel November Treating Physician/Extender: Rudene Re in Treatment: 22 Vital Signs Height(in): 71 Pulse(bpm): 66 Weight(lbs): 291.7 Blood Pressure 179/62 (mmHg): Body Mass Index(BMI): 41 Temperature(F): 97.7 Respiratory Rate 18 (breaths/min): Photos: [2:No Photos] [5:No Photos] [N/A:N/A] Wound Location: [2:Right Malleolus - Medial, Proximal] [5:Right Lower Leg - Anterior, Proximal] [N/A:N/A] Wounding Event: [2:Gradually Appeared] [5:Gradually Appeared] [N/A:N/A] Primary Etiology: [2:Venous Leg Ulcer] [5:Venous Leg Ulcer] [N/A:N/A] Comorbid History: [2:Sleep Apnea, Congestive  Heart Failure, Deep Vein Thrombosis, Hypertension, Type II Diabetes, Gout] [5:Sleep Apnea, Congestive Heart Failure, Deep Vein Thrombosis, Hypertension, Type II Diabetes, Gout] [N/A:N/A] Date Acquired: [2:07/31/2014] [5:10/11/2014] [N/A:N/A] Weeks of Treatment: [2:19] [5:8] [N/A:N/A] Wound Status: [2:Open] [5:Open] [N/A:N/A] Measurements L x W x D 9x5x0.1 [5:7x4x0.1] [N/A:N/A] (cm) Area (cm) : [2:35.343] [5:21.991] [N/A:N/A] Volume (cm) : [2:3.534] [5:2.199] [N/A:N/A] % Reduction in Area: [2:-4899.00%] [5:-107.00%] [N/A:N/A] % Reduction in Volume: -4877.50% [5:-106.90%] [N/A:N/A] Classification: [2:Full Thickness Without Exposed Support Structures] [5:Partial Thickness] [N/A:N/A] HBO Classification: [2:Grade 1] [5:Grade 1] [N/A:N/A] Exudate Amount: [2:Large] [5:Small] [N/A:N/A] Exudate Type: [2:Purulent] [5:Serosanguineous] [N/A:N/A] Exudate Color: [2:yellow, brown, green] [5:red, brown] [N/A:N/A] Wound Margin: [2:Distinct, outline attached] [5:Distinct, outline attached] [N/A:N/A] Granulation Amount: [2:Medium (34-66%)] [5:Medium (34-66%)] [N/A:N/A] Granulation Quality:  [2:Red, Pink] [5:Red] [N/A:N/A] Necrotic Amount: [2:Small (1-33%)] [5:Medium (34-66%)] [N/A:N/A] Necrotic Tissue: Adherent Slough Eschar N/A Exposed Structures: Fascia: No Fascia: No N/A Fat: No Fat: No Tendon: No Tendon: No Muscle: No Muscle: No Joint: No Joint: No Bone: No Bone: No Limited to Skin Limited to Skin Breakdown Breakdown Epithelialization: Medium (34-66%) Large (67-100%) N/A Periwound Skin Texture: Edema: Yes Edema: No N/A Scarring: Yes Excoriation: No Excoriation: No Induration: No Induration: No Callus: No Callus: No Crepitus: No Crepitus: No Fluctuance: No Fluctuance: No Friable: No Friable: No Rash: No Rash: No Scarring: No Periwound Skin Maceration: Yes Dry/Scaly: Yes N/A Moisture: Moist: Yes Maceration: No Dry/Scaly: No Moist: No Periwound Skin Color: Hemosiderin Staining: Yes Hemosiderin Staining: Yes N/A Atrophie Blanche: No Atrophie Blanche: No Cyanosis: No Cyanosis: No Ecchymosis: No Ecchymosis: No Erythema: No Erythema: No Mottled: No Mottled: No Pallor: No Pallor: No Rubor: No Rubor: No Temperature: No Abnormality No Abnormality N/A Tenderness on Yes No N/A Palpation: Wound Preparation: Ulcer Cleansing: Ulcer Cleansing: N/A Rinsed/Irrigated with Rinsed/Irrigated with Saline, Other: water and Saline soap Topical Anesthetic Topical Anesthetic Applied: Other: liudocaine Applied: Other: lidocaine 4% 4% Treatment Notes Electronic Signature(s) Signed: 12/11/2014 5:38:27 PM By: Curtis Sitesorthy, Joanna Entered By: Curtis Sitesorthy, Joanna on 12/11/2014 08:33:23 Maurice Sanders, Maurice Sanders ([id number]) -------------------------------------------------------------------------------- Multi-Disciplinary Care Plan Details Patient Name: Maurice Sanders, Maurice Sanders Date of Service: 12/11/2014 8:00 AM Medical Record Number: [id number] Patient Account Number: [id number] Date of Birth/Sex: 09/03/1945 (68 y.o. Male) Treating RN: Curtis Sitesorthy, Joanna Primary Care Physician: Beverely RisenKHAN,  FOZIA Other Clinician: Referring Physician: Daryel NovemberWILLIAMS, JONATHAN Treating Physician/Extender: Rudene ReBritto, Errol Weeks in Treatment: 22 Active Inactive Abuse / Safety / Falls / Self Care Management Nursing Diagnoses: Potential for falls Goals: Patient will remain injury free Date Initiated: 07/10/2014 Goal Status: Active Interventions: Assess fall risk on admission and as needed Notes: Nutrition Nursing Diagnoses: Potential for alteratiion in Nutrition/Potential for imbalanced nutrition Goals: Patient/caregiver agrees to and verbalizes understanding of need to use nutritional supplements and/or vitamins as prescribed Date Initiated: 07/10/2014 Goal Status: Active Interventions: Assess patient nutrition upon admission and as needed per policy Notes: Orientation to the Wound Care Program Nursing Diagnoses: Knowledge deficit related to the wound healing center program Goals: Patient/caregiver will verbalize understanding of the Wound Healing Center Program ChatsworthSILER, Maurice Sanders ([id number]) Date Initiated: 07/10/2014 Goal Status: Active Interventions: Provide education on orientation to the wound center Notes: Wound/Skin Impairment Nursing Diagnoses: Impaired tissue integrity Goals: Ulcer/skin breakdown will have a volume reduction of 30% by week 4 Date Initiated: 07/10/2014 Goal Status: Active Interventions: Assess ulceration(s) every visit Notes: Electronic Signature(s) Signed: 12/11/2014 5:38:27 PM By: Curtis Sitesorthy, Joanna Entered By: Curtis Sitesorthy, Joanna on 12/11/2014 08:33:13 Maurice Sanders, Maurice Sanders ([id number]) -------------------------------------------------------------------------------- Pain Assessment Details Patient Name: Maurice Sanders, Maurice Sanders Date of Service: 12/11/2014 8:00 AM Medical Record Number: [id number]  Patient Account Number: [id number] Date of Birth/Sex: 10/10/1945 [age] y.o. Male) Treating RN: Huel CoventryWoody, Kim Primary Care Physician: Beverely RisenKHAN, FOZIA Other Clinician: Referring Physician: Daryel NovemberWILLIAMS,  JONATHAN Treating Physician/Extender: Rudene ReBritto, Errol Weeks in Treatment: 22 Active Problems Location of Pain Severity and Description of Pain Patient Has Paino No Site Locations Pain Management and Medication Current Pain Management: Electronic Signature(s) Signed: 12/11/2014 8:58:38 AM By: Elliot GurneyWoody, RN, BSN, Kim RN, BSN Entered By: Elliot GurneyWoody, RN, BSN, Kim on 12/11/2014 08:08:41 Maurice Sanders, Maurice Sanders ([id number]) -------------------------------------------------------------------------------- Patient/Caregiver Education Details Patient Name: Maurice Sanders, Bryton Date of Service: 12/11/2014 8:00 AM Medical Record Number: [id number] Patient Account Number: [id number] Date of Birth/Gender: 11/01/1945 (68 y.o. Male) Treating RN: Huel CoventryWoody, Kim Primary Care Physician: Beverely RisenKHAN, FOZIA Other Clinician: Referring Physician: Daryel NovemberWILLIAMS, JONATHAN Treating Physician/Extender: Rudene ReBritto, Errol Weeks in Treatment: 22 Education Assessment Education Provided To: Patient Education Topics Provided Venous: Handouts: Controlling Swelling with Multilayered Compression Wraps, Other: Elevate legs above heart Methods: Demonstration, Explain/Verbal Responses: State content correctly Electronic Signature(s) Signed: 12/11/2014 8:58:38 AM By: Elliot GurneyWoody, RN, BSN, Kim RN, BSN Entered By: Elliot GurneyWoody, RN, BSN, Kim on 12/11/2014 08:57:50 Maurice Sanders, Maurice Sanders ([id number]) -------------------------------------------------------------------------------- Wound Assessment Details Patient Name: Maurice Sanders, Nichlos Date of Service: 12/11/2014 8:00 AM Medical Record Number: [id number] Patient Account Number: [id number] Date of Birth/Sex: 12/14/1945 (68 y.o. Male) Treating RN: Huel CoventryWoody, Kim Primary Care Physician: Beverely RisenKHAN, FOZIA Other Clinician: Referring Physician: Daryel NovemberWILLIAMS, JONATHAN Treating Physician/Extender: Rudene ReBritto, Errol Weeks in Treatment: 22 Wound Status Wound Number: 2 Primary Venous Leg Ulcer Etiology: Wound Location: Right Malleolus - Medial, Proximal Wound  Open Status: Wounding Event: Gradually Appeared Comorbid Sleep Apnea, Congestive Heart Failure, Date Acquired: 07/31/2014 History: Deep Vein Thrombosis, Hypertension, Weeks Of Treatment: 19 Type II Diabetes, Gout Clustered Wound: No Photos Photo Uploaded By: Elliot GurneyWoody, RN, BSN, Kim on 12/11/2014 10:45:57 Wound Measurements Length: (cm) 9 Width: (cm) 5 Depth: (cm) 0.1 Area: (cm) 35.343 Volume: (cm) 3.534 % Reduction in Area: -4899% % Reduction in Volume: -4877.5% Epithelialization: Medium (34-66%) Wound Description Full Thickness Without Classification: Exposed Support Structures Diabetic Severity Grade 1 (Wagner): Maurice Sanders, Maurice Sanders ([id number]) Foul Odor After Cleansing: No Wound Margin: Distinct, outline attached Exudate Amount: Large Exudate Type: Purulent Exudate Color: yellow, brown, green Wound Bed Granulation Amount: Medium (34-66%) Exposed Structure Granulation Quality: Red, Pink Fascia Exposed: No Necrotic Amount: Small (1-33%) Fat Layer Exposed: No Necrotic Quality: Adherent Slough Tendon Exposed: No Muscle Exposed: No Joint Exposed: No Bone Exposed: No Limited to Skin Breakdown Periwound Skin Texture Texture Color No Abnormalities Noted: No No Abnormalities Noted: No Callus: No Atrophie Blanche: No Crepitus: No Cyanosis: No Excoriation: No Ecchymosis: No Fluctuance: No Erythema: No Friable: No Hemosiderin Staining: Yes Induration: No Mottled: No Localized Edema: Yes Pallor: No Rash: No Rubor: No Scarring: Yes Temperature / Pain Moisture Temperature: No Abnormality No Abnormalities Noted: No Tenderness on Palpation: Yes Dry / Scaly: No Maceration: Yes Moist: Yes Wound Preparation Ulcer Cleansing: Rinsed/Irrigated with Saline, Other: water and soap, Topical Anesthetic Applied: Other: lidocaine 4%, Treatment Notes Wound #2 (Right, Proximal, Medial Malleolus) 1. Cleansed with: Clean wound with Normal Saline Cleanse wound with  antibacterial soap and water 2. Anesthetic Topical Lidocaine 4% cream to wound bed prior to debridement 3. Peri-wound Care: Barrier cream Early CharsSILER, Maurice Sanders ([id number]) 4. Dressing Applied: Aquacel Ag 5. Secondary Dressing Applied ABD Pad 7. Secured with 4-Layer Compression System - Right Lower Extremity Electronic Signature(s) Signed: 12/11/2014 8:58:38 AM By: Elliot GurneyWoody, RN, BSN, Kim RN, BSN Entered By: Elliot GurneyWoody, RN, BSN, Kim on 12/11/2014 08:26:11 Maurice Sanders, Maurice Sanders ([id number]) -------------------------------------------------------------------------------- Wound Assessment Details Patient Name: Early CharsSILER,  Maurice Sanders Date of Service: 12/11/2014 8:00 AM Medical Record Number: [id number] Patient Account Number: [id number] Date of Birth/Sex: Sep 24, 1945 (69 y.o. Male) Treating RN: Huel Coventry Primary Care Physician: Beverely Risen Other Clinician: Referring Physician: Daryel November Treating Physician/Extender: Rudene Re in Treatment: 22 Wound Status Wound Number: 5 Primary Venous Leg Ulcer Etiology: Wound Location: Right Lower Leg - Anterior, Proximal Wound Open Status: Wounding Event: Gradually Appeared Comorbid Sleep Apnea, Congestive Heart Failure, Date Acquired: 10/11/2014 History: Deep Vein Thrombosis, Hypertension, Weeks Of Treatment: 8 Type II Diabetes, Gout Clustered Wound: No Photos Photo Uploaded By: Elliot Gurney, RN, BSN, Kim on 12/11/2014 10:45:57 Wound Measurements Length: (cm) 7 Width: (cm) 4 Depth: (cm) 0.1 Area: (cm) 21.991 Volume: (cm) 2.199 % Reduction in Area: -107% % Reduction in Volume: -106.9% Epithelialization: Large (67-100%) Wound Description Classification: Partial Thickness Foul Diabetic Severity (Wagner): Grade 1 Wound Margin: Distinct, outline attached Exudate Amount: Small Sesler, Maurice Sanders ([id number]) Odor After Cleansing: No Exudate Type: Serosanguineous Exudate Color: red, brown Wound Bed Granulation Amount: Medium (34-66%) Exposed  Structure Granulation Quality: Red Fascia Exposed: No Necrotic Amount: Medium (34-66%) Fat Layer Exposed: No Necrotic Quality: Eschar Tendon Exposed: No Muscle Exposed: No Joint Exposed: No Bone Exposed: No Limited to Skin Breakdown Periwound Skin Texture Texture Color No Abnormalities Noted: No No Abnormalities Noted: No Callus: No Atrophie Blanche: No Crepitus: No Cyanosis: No Excoriation: No Ecchymosis: No Fluctuance: No Erythema: No Friable: No Hemosiderin Staining: Yes Induration: No Mottled: No Localized Edema: No Pallor: No Rash: No Rubor: No Scarring: No Temperature / Pain Moisture Temperature: No Abnormality No Abnormalities Noted: No Dry / Scaly: Yes Maceration: No Moist: No Wound Preparation Ulcer Cleansing: Rinsed/Irrigated with Saline Topical Anesthetic Applied: Other: liudocaine 4%, Treatment Notes Wound #5 (Right, Proximal, Anterior Lower Leg) 1. Cleansed with: Cleanse wound with antibacterial soap and water 2. Anesthetic Topical Lidocaine 4% cream to wound bed prior to debridement 3. Peri-wound Care: Barrier cream 4. Dressing Applied: Prisma Ag 5. Secondary Dressing Applied Jeangilles, Maurice Sanders ([id number]) Dry Gauze 7. Secured with 4-Layer Compression System - Right Lower Extremity Electronic Signature(s) Signed: 12/11/2014 8:58:38 AM By: Elliot Gurney, RN, BSN, Kim RN, BSN Entered By: Elliot Gurney, RN, BSN, Kim on 12/11/2014 08:26:45 Maurice Bayley ([id number]) -------------------------------------------------------------------------------- Vitals Details Patient Name: Maurice Bayley Date of Service: 12/11/2014 8:00 AM Medical Record Number: [id number] Patient Account Number: [id number] Date of Birth/Sex: 1946/07/28 (68 y.o. Male) Treating RN: Huel Coventry Primary Care Physician: Beverely Risen Other Clinician: Referring Physician: Daryel November Treating Physician/Extender: Rudene Re in Treatment: 22 Vital Signs Time Taken: 08:08 Temperature  (F): 97.7 Height (in): 71 Pulse (bpm): 66 Weight (lbs): 291.7 Respiratory Rate (breaths/min): 18 Body Mass Index (BMI): 40.7 Blood Pressure (mmHg): 179/62 Reference Range: 80 - 120 mg / dl Electronic Signature(s) Signed: 12/11/2014 8:58:38 AM By: Elliot Gurney, RN, BSN, Kim RN, BSN Entered By: Elliot Gurney, RN, BSN, Kim on 12/11/2014 53:66:44

## 2014-12-14 DIAGNOSIS — L97311 Non-pressure chronic ulcer of right ankle limited to breakdown of skin: Secondary | ICD-10-CM | POA: Diagnosis not present

## 2014-12-14 DIAGNOSIS — I87311 Chronic venous hypertension (idiopathic) with ulcer of right lower extremity: Secondary | ICD-10-CM | POA: Diagnosis not present

## 2014-12-14 DIAGNOSIS — I87309 Chronic venous hypertension (idiopathic) without complications of unspecified lower extremity: Secondary | ICD-10-CM | POA: Diagnosis not present

## 2014-12-14 DIAGNOSIS — E11620 Type 2 diabetes mellitus with diabetic dermatitis: Secondary | ICD-10-CM | POA: Diagnosis not present

## 2014-12-15 DIAGNOSIS — I890 Lymphedema, not elsewhere classified: Secondary | ICD-10-CM | POA: Diagnosis not present

## 2014-12-15 NOTE — Progress Notes (Signed)
Maurice Sanders, Maurice Sanders ([id number]) Visit Report for 12/14/2014 Arrival Information Details Patient Name: Maurice Sanders, Maurice Sanders Date of Service: 12/14/2014 9:45 AM Medical Record Number: [id number] Patient Account Number: [id number] Date of Birth/Sex: September 04, 1945 (69 y.o. Male) Treating RN: Kristin Bruins Primary Care Physician: Beverely Risen Other Clinician: Referring Physician: Beverely Risen Treating Physician/Extender: Rudene Re in Treatment: 22 Visit Information History Since Last Visit Added or deleted any medications: No Patient Arrived: Ambulatory Any new allergies or adverse reactions: No Arrival Time: 10:05 Had a fall or experienced change in No Accompanied By: self activities of daily living that may affect Transfer Assistance: None risk of falls: Patient Identification Verified: Yes Signs or symptoms of abuse/neglect since last No Secondary Verification Process Yes visito Completed: Hospitalized since last visit: No Patient Requires Transmission- No Has Dressing in Place as Prescribed: Yes Based Precautions: Has Compression in Place as Prescribed: Yes Patient Has Alerts: Yes Pain Present Now: No Patient Alerts: Patient on Blood Thinner DM II ABI L 1.06; R 1.13 Electronic Signature(s) Signed: 12/14/2014 4:22:36 PM By: Kristin Bruins Entered By: Kristin Bruins on 12/14/2014 10:06:42 Maurice Sanders ([id number]) -------------------------------------------------------------------------------- Encounter Discharge Information Details Patient Name: Maurice Sanders Date of Service: 12/14/2014 9:45 AM Medical Record Number: [id number] Patient Account Number: [id number] Date of Birth/Sex: 03-Jun-1946 (69 y.o. Male) Treating RN: Kristin Bruins Primary Care Physician: Beverely Risen Other Clinician: Referring Physician: Beverely Risen Treating Physician/Extender: Rudene Re in Treatment: 68 Encounter Discharge Information Items Discharge Pain Level: 0 Discharge Condition:  Stable Ambulatory Status: Ambulatory Discharge Destination: Home Private Transportation: Auto Accompanied By: self Schedule Follow-up Appointment: Yes Medication Reconciliation completed and No provided to Patient/Care Maurice Sanders: Clinical Summary of Care: Electronic Signature(s) Signed: 12/14/2014 4:22:36 PM By: Kristin Bruins Entered By: Kristin Bruins on 12/14/2014 10:58:15 Maurice Sanders ([id number]) -------------------------------------------------------------------------------- Lower Extremity Assessment Details Patient Name: Maurice Sanders Date of Service: 12/14/2014 9:45 AM Medical Record Number: [id number] Patient Account Number: [id number] Date of Birth/Sex: Dec 19, 1945 (68 y.o. Male) Treating RN: Kristin Bruins Primary Care Physician: Beverely Risen Other Clinician: Referring Physician: Beverely Risen Treating Physician/Extender: Rudene Re in Treatment: 22 Edema Assessment Assessed: [Left: No] [Right: No] E[Left: dema] [Right: :] Calf Left: Right: Point of Measurement: 33 cm From Medial Instep cm 38.8 cm Ankle Left: Right: Point of Measurement: 11 cm From Medial Instep cm 24 cm Vascular Assessment Pulses: Posterior Tibial Palpable: [Right:No] Doppler: [Right:Multiphasic] Dorsalis Pedis Palpable: [Right:No] Extremity colors, hair growth, and conditions: Extremity Color: [Right:Hyperpigmented] Hair Growth on Extremity: [Right:No] Temperature of Extremity: [Right:Warm] Capillary Refill: [Right:< 3 seconds] Toe Nail Assessment Left: Right: Thick: Yes Discolored: Yes Deformed: Yes Improper Length and Hygiene: Yes Electronic Signature(s) Signed: 12/14/2014 4:22:36 PM By: Kristin Bruins Entered By: Kristin Bruins on 12/14/2014 10:30:55 Maurice Sanders ([id number]) Maurice Sanders ([id number]) -------------------------------------------------------------------------------- Multi Wound Chart Details Patient Name: Maurice Sanders Date of Service: 12/14/2014  9:45 AM Medical Record Number: [id number] Patient Account Number: [id number] Date of Birth/Sex: [birth year] (68 y.o. Male) Treating RN: Curtis Sites Primary Care Physician: Beverely Risen Other Clinician: Referring Physician: Beverely Risen Treating Physician/Extender: Rudene Re in Treatment: 22 Vital Signs Height(in): 71 Pulse(bpm): 72 Weight(lbs): 291.7 Blood Pressure 173/100 (mmHg): Body Mass Index(BMI): 41 Temperature(F): 97.5 Respiratory Rate 18 (breaths/min): Photos: [2:No Photos] [5:No Photos] [N/A:N/A] Wound Location: [2:Right Malleolus - Medial, Proximal] [5:Right Lower Leg - Anterior, Proximal] [N/A:N/A] Wounding Event: [2:Gradually Appeared] [5:Gradually Appeared] [N/A:N/A] Primary Etiology: [2:Venous Leg Ulcer] [5:Venous Leg Ulcer] [N/A:N/A] Comorbid History: [2:Sleep Apnea, Congestive Heart Failure, Deep Vein Thrombosis, Hypertension, Type II Diabetes, Gout] [5:Sleep Apnea, Congestive Heart  Failure, Deep Vein Thrombosis, Hypertension, Type II Diabetes, Gout] [N/A:N/A] Date Acquired: [2:07/31/2014] [5:10/11/2014] [N/A:N/A] Weeks of Treatment: [2:19] [5:8] [N/A:N/A] Wound Status: [2:Open] [5:Open] [N/A:N/A] Measurements L x W x D 4x4.5x0.2 [5:2x2x0.1] [N/A:N/A] (cm) Area (cm) : [2:14.137] [5:3.142] [N/A:N/A] Volume (cm) : [2:2.827] [5:0.314] [N/A:N/A] % Reduction in Area: [2:-1899.60%] [5:70.40%] [N/A:N/A] % Reduction in Volume: -3881.70% [5:70.50%] [N/A:N/A] Classification: [2:Full Thickness Without Exposed Support Structures] [5:Partial Thickness] [N/A:N/A] HBO Classification: [2:Grade 1] [5:Grade 1] [N/A:N/A] Exudate Amount: [2:Medium] [5:Small] [N/A:N/A] Exudate Type: [2:Serous] [5:Serosanguineous] [N/A:N/A] Exudate Color: [2:amber] [5:red, brown] [N/A:N/A] Wound Margin: [2:Distinct, outline attached] [5:Distinct, outline attached] [N/A:N/A] Granulation Amount: [2:Medium (34-66%)] [5:Medium (34-66%)] [N/A:N/A] Granulation Quality: [2:Red, Pink] [5:Red]  [N/A:N/A] Necrotic Amount: [2:Small (1-33%)] [5:Small (1-33%)] [N/A:N/A] Exposed Structures: Fascia: No Fascia: No N/A Fat: No Fat: No Tendon: No Tendon: No Muscle: No Muscle: No Joint: No Joint: No Bone: No Bone: No Limited to Skin Limited to Skin Breakdown Breakdown Epithelialization: Medium (34-66%) Large (67-100%) N/A Periwound Skin Texture: Scarring: Yes Edema: No N/A Edema: No Excoriation: No Excoriation: No Induration: No Induration: No Callus: No Callus: No Crepitus: No Crepitus: No Fluctuance: No Fluctuance: No Friable: No Friable: No Rash: No Rash: No Scarring: No Periwound Skin Maceration: Yes Dry/Scaly: Yes N/A Moisture: Moist: Yes Maceration: No Dry/Scaly: No Moist: No Periwound Skin Color: Hemosiderin Staining: Yes Hemosiderin Staining: Yes N/A Atrophie Blanche: No Atrophie Blanche: No Cyanosis: No Cyanosis: No Ecchymosis: No Ecchymosis: No Erythema: No Erythema: No Mottled: No Mottled: No Pallor: No Pallor: No Rubor: No Rubor: No Temperature: No Abnormality No Abnormality N/A Tenderness on Yes No N/A Palpation: Wound Preparation: Ulcer Cleansing: Ulcer Cleansing: N/A Rinsed/Irrigated with Rinsed/Irrigated with Saline, Other: water and Saline soap Topical Anesthetic Topical Anesthetic Applied: Other: liudocaine Applied: Other: lidocaine 4% 4% Treatment Notes Electronic Signature(s) Signed: 12/14/2014 5:34:11 PM By: Curtis Sites Entered By: Curtis Sites on 12/14/2014 10:47:12 Maurice Sanders ([id number]) -------------------------------------------------------------------------------- Multi-Disciplinary Care Plan Details Patient Name: Maurice Sanders Date of Service: 12/14/2014 9:45 AM Medical Record Number: [id number] Patient Account Number: [id number] Date of Birth/Sex: January 24, 1946 (68 y.o. Male) Treating RN: Curtis Sites Primary Care Physician: Beverely Risen Other Clinician: Referring Physician: Beverely Risen Treating  Physician/Extender: Rudene Re in Treatment: 22 Active Inactive Abuse / Safety / Falls / Self Care Management Nursing Diagnoses: Potential for falls Goals: Patient will remain injury free Date Initiated: 07/10/2014 Goal Status: Active Interventions: Assess fall risk on admission and as needed Notes: Nutrition Nursing Diagnoses: Potential for alteratiion in Nutrition/Potential for imbalanced nutrition Goals: Patient/caregiver agrees to and verbalizes understanding of need to use nutritional supplements and/or vitamins as prescribed Date Initiated: 07/10/2014 Goal Status: Active Interventions: Assess patient nutrition upon admission and as needed per policy Notes: Orientation to the Wound Care Program Nursing Diagnoses: Knowledge deficit related to the wound healing center program Goals: Patient/caregiver will verbalize understanding of the Wound Healing Center Program [place], [place] ([id number]) Date Initiated: 07/10/2014 Goal Status: Active Interventions: Provide education on orientation to the wound center Notes: Wound/Skin Impairment Nursing Diagnoses: Impaired tissue integrity Goals: Ulcer/skin breakdown will have a volume reduction of 30% by week 4 Date Initiated: 07/10/2014 Goal Status: Active Interventions: Assess ulceration(s) every visit Notes: Electronic Signature(s) Signed: 12/14/2014 5:34:11 PM By: Curtis Sites Entered By: Curtis Sites on 12/14/2014 10:46:59 Maurice Sanders ([id number]) -------------------------------------------------------------------------------- Pain Assessment Details Patient Name: Maurice Sanders Date of Service: 12/14/2014 9:45 AM Medical Record Number: [id number] Patient Account Number: [id number] Date of Birth/Sex: 19-Nov-1945 (69 y.o. Male) Treating RN: Kristin Bruins Primary Care Physician: Beverely Risen Other Clinician:  Referring Physician: Beverely RisenKHAN, FOZIA Treating Physician/Extender: Rudene ReBritto, Errol Weeks in Treatment:  22 Active Problems Location of Pain Severity and Description of Pain Patient Has Paino Yes Site Locations Pain Location: Pain in Ulcers With Dressing Change: Yes Duration of the Pain. Constant / Intermittento Intermittent Rate the pain. Current Pain Level: 3 Worst Pain Level: 8 Least Pain Level: 0 Tolerable Pain Level: 5 Character of Pain Describe the Pain: Aching, Burning Pain Management and Medication Current Pain Management: Medication: Yes Cold Application: No Rest: No Massage: No Activity: No T.E.N.S.: No Heat Application: No Leg drop or elevation: No Is the Current Pain Management Inadequate Adequate: How does your pain impact your activities of daily livingo Sleep: No Bathing: No Appetite: No Relationship With Others: No Bladder Continence: No Emotions: No Bowel Continence: No Work: No Toileting: No Drive: No Dressing: No Hobbies: No Electronic Mickeal SkinnerSignature(s) Sanders, Maurice ([id number]) Signed: 12/14/2014 4:22:36 PM By: Kristin Bruinsanneker, Nancy Entered By: Kristin Bruinsanneker, Nancy on 12/14/2014 10:11:30 Maurice BayleySILER, Maurice Sanders ([id number]) -------------------------------------------------------------------------------- Patient/Caregiver Education Details Patient Name: Maurice BayleySILER, Maurice Sanders Date of Service: 12/14/2014 9:45 AM Medical Record Number: [id number] Patient Account Number: [id number] Date of Birth/Gender: 10/24/1945 (69 y.o. Male) Treating RN: Kristin Bruinsanneker, Nancy Primary Care Physician: Beverely RisenKHAN, FOZIA Other Clinician: Referring Physician: Beverely RisenKHAN, FOZIA Treating Physician/Extender: Rudene ReBritto, Errol Weeks in Treatment: 22 Education Assessment Education Provided To: Patient Education Topics Provided Nutrition: Handouts: Nutrition, Other: limit salt intake Methods: Explain/Verbal Responses: State content correctly Offloading: Wound/Skin Impairment: Handouts: Caring for Your Ulcer Methods: Explain/Verbal Responses: State content correctly Electronic Signature(s) Signed: 12/14/2014  4:22:36 PM By: Kristin Bruinsanneker, Nancy Entered By: Kristin Bruinsanneker, Nancy on 12/14/2014 10:58:55 Maurice BayleySILER, Maurice Sanders ([id number]) -------------------------------------------------------------------------------- Wound Assessment Details Patient Name: Maurice BayleySILER, Maurice Sanders Date of Service: 12/14/2014 9:45 AM Medical Record Number: [id number] Patient Account Number: [id number] Date of Birth/Sex: 05/10/1946 [age] y.o. Male) Treating RN: Kristin Bruinsanneker, Nancy Primary Care Physician: Beverely RisenKHAN, FOZIA Other Clinician: Referring Physician: Beverely RisenKHAN, FOZIA Treating Physician/Extender: Rudene ReBritto, Errol Weeks in Treatment: 22 Wound Status Wound Number: 2 Primary Venous Leg Ulcer Etiology: Wound Location: Right Malleolus - Medial, Proximal Wound Open Status: Wounding Event: Gradually Appeared Comorbid Sleep Apnea, Congestive Heart Failure, Date Acquired: 07/31/2014 History: Deep Vein Thrombosis, Hypertension, Weeks Of Treatment: 19 Type II Diabetes, Gout Clustered Wound: No Wound Measurements Length: (cm) 4 Width: (cm) 4.5 Depth: (cm) 0.2 Area: (cm) 14.137 Volume: (cm) 2.827 % Reduction in Area: -1899.6% % Reduction in Volume: -3881.7% Epithelialization: Medium (34-66%) Tunneling: No Undermining: No Wound Description Full Thickness Without Classification: Exposed Support Structures Diabetic Severity Grade 1 (Wagner): Wound Margin: Distinct, outline attached Exudate Amount: Medium Exudate Type: Serous Exudate Color: amber Foul Odor After Cleansing: No Wound Bed Granulation Amount: Medium (34-66%) Exposed Structure Granulation Quality: Red, Pink Fascia Exposed: No Necrotic Amount: Small (1-33%) Fat Layer Exposed: No Necrotic Quality: Adherent Slough Tendon Exposed: No Muscle Exposed: No Joint Exposed: No Bone Exposed: No Limited to Skin Breakdown Periwound Skin Texture Texture Color No Abnormalities Noted: No No Abnormalities Noted: No Maurice Sanders, Maurice Sanders ([id number]) Callus: No Atrophie Blanche: No Crepitus:  No Cyanosis: No Excoriation: No Ecchymosis: No Fluctuance: No Erythema: No Friable: No Hemosiderin Staining: Yes Induration: No Mottled: No Localized Edema: No Pallor: No Rash: No Rubor: No Scarring: Yes Temperature / Pain Moisture Temperature: No Abnormality No Abnormalities Noted: No Tenderness on Palpation: Yes Dry / Scaly: No Maceration: Yes Moist: Yes Wound Preparation Ulcer Cleansing: Rinsed/Irrigated with Saline, Other: water and soap, Topical Anesthetic Applied: Other: lidocaine 4%, Treatment Notes Wound #2 (Right, Proximal, Medial Malleolus) 1. Cleansed with: Cleanse wound with antibacterial soap and water 2.  Anesthetic Liquid 4% Topical Lidocaine Solution 4. Dressing Applied: Aquacel Ag 5. Secondary Dressing Applied ABD Pad 7. Secured with Henriette CombsUnna Boot to Right Lower Extremity Electronic Signature(s) Signed: 12/14/2014 4:22:36 PM By: Kristin Bruinsanneker, Nancy Entered By: Kristin Bruinsanneker, Nancy on 12/14/2014 10:32:32 Maurice BayleySILER, Aaronmichael ([id number]) -------------------------------------------------------------------------------- Wound Assessment Details Patient Name: Maurice BayleySILER, Jabree Date of Service: 12/14/2014 9:45 AM Medical Record Number: [id number] Patient Account Number: [id number] Date of Birth/Sex: 08/31/1945 (68 y.o. Male) Treating RN: Kristin Bruinsanneker, Nancy Primary Care Physician: Beverely RisenKHAN, FOZIA Other Clinician: Referring Physician: Beverely RisenKHAN, FOZIA Treating Physician/Extender: Rudene ReBritto, Errol Weeks in Treatment: 22 Wound Status Wound Number: 5 Primary Venous Leg Ulcer Etiology: Wound Location: Right Lower Leg - Anterior, Proximal Wound Open Status: Wounding Event: Gradually Appeared Comorbid Sleep Apnea, Congestive Heart Failure, Date Acquired: 10/11/2014 History: Deep Vein Thrombosis, Hypertension, Weeks Of Treatment: 8 Type II Diabetes, Gout Clustered Wound: No Wound Measurements Length: (cm) 2 Width: (cm) 2 Depth: (cm) 0.1 Area: (cm) 3.142 Volume: (cm) 0.314 % Reduction  in Area: 70.4% % Reduction in Volume: 70.5% Epithelialization: Large (67-100%) Tunneling: No Undermining: No Wound Description Classification: Partial Thickness Foul Diabetic Severity (Wagner): Grade 1 Wound Margin: Distinct, outline attached Exudate Amount: Small Exudate Type: Serosanguineous Exudate Color: red, brown Odor After Cleansing: No Wound Bed Granulation Amount: Medium (34-66%) Exposed Structure Granulation Quality: Red Fascia Exposed: No Necrotic Amount: Small (1-33%) Fat Layer Exposed: No Necrotic Quality: Adherent Slough Tendon Exposed: No Muscle Exposed: No Joint Exposed: No Bone Exposed: No Limited to Skin Breakdown Periwound Skin Texture Texture Color No Abnormalities Noted: No No Abnormalities Noted: No Callus: No Atrophie Blanche: No Crepitus: No Cyanosis: No Early CharsSILER, Abdirahim ([id number]) Excoriation: No Ecchymosis: No Fluctuance: No Erythema: No Friable: No Hemosiderin Staining: Yes Induration: No Mottled: No Localized Edema: No Pallor: No Rash: No Rubor: No Scarring: No Temperature / Pain Moisture Temperature: No Abnormality No Abnormalities Noted: No Dry / Scaly: Yes Maceration: No Moist: No Wound Preparation Ulcer Cleansing: Rinsed/Irrigated with Saline Topical Anesthetic Applied: Other: liudocaine 4%, Treatment Notes Wound #5 (Right, Proximal, Anterior Lower Leg) 1. Cleansed with: Cleanse wound with antibacterial soap and water 2. Anesthetic Liquid 4% Topical Lidocaine Solution 4. Dressing Applied: Aquacel Ag 5. Secondary Dressing Applied ABD Pad 7. Secured with Henriette CombsUnna Boot to Right Lower Extremity Electronic Signature(s) Signed: 12/14/2014 4:22:36 PM By: Kristin Bruinsanneker, Nancy Entered By: Kristin Bruinsanneker, Nancy on 12/14/2014 10:33:02 Maurice BayleySILER, Dorman ([id number]) -------------------------------------------------------------------------------- Vitals Details Patient Name: Maurice BayleySILER, Tremond Date of Service: 12/14/2014 9:45 AM Medical Record  Number: [id number] Patient Account Number: [id number] Date of Birth/Sex: 08/28/1945 (68 y.o. Male) Treating RN: Kristin Bruinsanneker, Nancy Primary Care Physician: Beverely RisenKHAN, FOZIA Other Clinician: Referring Physician: Beverely RisenKHAN, FOZIA Treating Physician/Extender: Rudene ReBritto, Errol Weeks in Treatment: 22 Vital Signs Time Taken: 10:10 Temperature (F): 97.5 Height (in): 71 Pulse (bpm): 72 Weight (lbs): 291.7 Respiratory Rate (breaths/min): 18 Body Mass Index (BMI): 40.7 Blood Pressure (mmHg): 173/100 Reference Range: 80 - 120 mg / dl Electronic Signature(s) Signed: 12/14/2014 4:22:36 PM By: Kristin Bruinsanneker, Nancy Entered By: Kristin Bruinsanneker, Nancy on 12/14/2014 10:28:01

## 2014-12-15 NOTE — Progress Notes (Signed)
KONA, LOVER ([id number]) Visit Report for 12/14/2014 Chief Complaint Document Details Patient Name: Maurice Sanders, Maurice Sanders Date of Service: 12/14/2014 9:45 AM Medical Record Number: [id number] Patient Account Number: [id number] Date of Birth/Sex: 1945/12/30 (69 y.o. Male) Treating RN: Curtis Sites Primary Care Physician: Beverely Risen Other Clinician: Referring Physician: Beverely Risen Treating Physician/Extender: Rudene Re in Treatment: 22 Information Obtained from: Patient Chief Complaint Lucius has been coming for dressing changes twice a week, on Monday and Thursday. Over all he is doing fine he has his blood sugar under control and is managing his diuretics well. Electronic Signature(s) Signed: 12/14/2014 12:26:09 PM By: Evlyn Kanner MD, FACS Entered By: Evlyn Kanner on 12/14/2014 10:54:25 Maurice Sanders ([id number]) -------------------------------------------------------------------------------- HPI Details Patient Name: Maurice Sanders Date of Service: 12/14/2014 9:45 AM Medical Record Number: [id number] Patient Account Number: [id number] Date of Birth/Sex: 13-Dec-1945 (69 y.o. Male) Treating RN: Curtis Sites Primary Care Physician: Beverely Risen Other Clinician: Referring Physician: Beverely Risen Treating Physician/Extender: Rudene Re in Treatment: 22 History of Present Illness Location: right lower extremity Quality: Patient reports experiencing a dull pain to affected area(s). Severity: patient denies any severe pain. Duration: he has had this wound for a few weeks now. Timing: Pain in wound is Intermittent (comes and goes Context: the patient has a long history of venous insufficiency. He has been very compliant with wearing his compression stockings. Modifying Factors: the patient normally wears compression stockings. Recently he had placed a fair amount of Vaseline which cause skin irritation. Associated Signs and Symptoms: Patient denies any fevers. He has had  a moderate amount of drainage. HPI Description: The patient is 69 year-old male with a history of diabetes who presents with right lower extremity wound. He first presented to our clinic on 07/10/2014. He does have a history of venous insufficiency in the past. He has been very compliant with wearing his compression stockings. He recently Vaseline on his legs which caused skin irritation and breakdown. The patient returns for follow-up today. he denies any fevers at home. However due to the fact that he had stopped his diuretic he has had a lot of edema of the lower extremity. He also has a lot of scaly skin on his right lower extremity. 10/02/14 -- he has resumed his diuretics on a regular basis and has been walking around adequately and has overall improved. No pain or no discharge from the wounds. 10/09/14 -- the patient has been having some pain at night while sleeping and was requesting pain medications. He also says that due to taking his diuretics he's been getting some cramps and I have asked him to please see his PCP regarding both these problems. Also awaiting insurance clearance regarding his Apligraf. addendum: I have now got a clear copy of his vascular workup studies and have reviewed the entire details dated encounter 08/17/2014. the lower extremity venous duplex study done on 08/17/2014 revealed no evidence of deep vein thrombosis or superficial thrombophlebitis bilaterally, no incompetence of the greater small saphenous veins bilaterally, enlarged lymph nodes in the groin bilaterally. As seen by Dr. Gilda Crease on the same day. Dr. Gilda Crease recommended that he should continue wearing graduated compression stockings of class I which is 20-30 mmHg pressure on a daily basis and a prescription was given. He was to be reassessed in 6 months and the question of getting him lymphedema pumps were also discussed. 10/16/2014. Vascular note reviewed in detail. I have been able to review his  notes from Dr. Gilda Crease on 08/17/2014. 1.The venous duplex study  done on 08/17/2014 showed that all veins visualized showed no evidence of DVT. 2. no incompetence of the greater small saphenous veins bilaterally. Maurice Sanders, Maurice Sanders ([id number]) The plan was that he had no surgery or intervention needed at this point. The long discussion had showed that the patient had chronic skin changes accompanied by venous insufficiency and at this stage he would benefit from wearing graduated compression stockings of the 20-30 mmHg on a daily basis and review would be done in his office back again in 6 months. At that time the patient could have a lymph pump. except for the new abrasion on the right lower extremity just below his tibial tuberosity he is doing fine and his ulcers actually look much better. 10/23/14 -- Culture report on Maurice Sanders --- His wound culture has grown Citrobacter koseri and Enterococcus faecalis. Final report is back but so far sensitive to Bactrim and ampicillin and ciprofloxacin. Bactrim DS was called in for 14 days. 11/13/2014 last week we changed his Unna boot on Monday and Thursday and this has helped him a lot. The edema has been much better the Unna boot hasn't slipped down and he has not had any skin abrasions. 11/20/2014 -- he saw the vascular surgeon Dr. Gilda Crease this morning and he was pleased with his progress and has said he would be ordering him some lymphedema pumps. Other than that the patient is coming to change his Unna's boots twice a week and he is doing well with this. 11/27/2014 -- over Saturday and Sunday he started having more swelling in his leg and some pain and I believe his own Unna's boot caused him some discomfort and excoriation of skin. He does admit he took some more juices orally but he has continued his diuretic. 12/11/2014 -- he has completed his course of antibiotics and has had no fresh issues. He has had no pain no swelling and is managing well  with his blood sugars and diuretics. 12/14/2014 between Monday and today he has had some dietary indiscretions and had some [place] food which may have made him retain a lot of fluid. His edema has increased markedly and a lot of more excoriation. Electronic Signature(s) Signed: 12/14/2014 12:26:09 PM By: Evlyn Kanner MD, FACS Entered By: Evlyn Kanner on 12/14/2014 10:55:05 Maurice Sanders ([id number]) -------------------------------------------------------------------------------- Physical Exam Details Patient Name: Maurice Sanders Date of Service: 12/14/2014 9:45 AM Medical Record Number: [id number] Patient Account Number: [id number] Date of Birth/Sex: [birth year] (68 y.o. Male) Treating RN: Curtis Sites Primary Care Physician: Beverely Risen Other Clinician: Referring Physician: Beverely Risen Treating Physician/Extender: Rudene Re in Treatment: 22 Constitutional . Pulse regular. Respirations normal and unlabored. Afebrile. . Eyes Nonicteric. Reactive to light. Ears, Nose, Mouth, and Throat Lips, teeth, and gums WNL.Marland Kitchen Moist mucosa without lesions . Neck supple and nontender. No palpable supraclavicular or cervical adenopathy. Normal sized without goiter. Respiratory WNL. No retractions.. Cardiovascular Pedal Pulses WNL. he has increasing edema on the right lower extremity and much more excoriation of the skin.. Integumentary (Hair, Skin) the ulcerated area on the right medial leg is still the same and there is minimal fibrin. No crepitus or fluctuance. No peri-wound warmth or erythema. No masses.Marland Kitchen Psychiatric Judgement and insight Intact.. No evidence of depression, anxiety, or agitation.. Electronic Signature(s) Signed: 12/14/2014 12:26:09 PM By: Evlyn Kanner MD, FACS Entered By: Evlyn Kanner on 12/14/2014 10:55:54 Maurice Sanders ([id number]) -------------------------------------------------------------------------------- Physician Orders Details Patient Name: Maurice Sanders Date of Service: 12/14/2014 9:45 AM Medical Record Number: [id number] Patient Account Number: [id number]  Date of Birth/Sex: 12/11/1945 [age] y.o. Male) Treating RN: Curtis Sitesorthy, Joanna Primary Care Physician: Beverely RisenKHAN, FOZIA Other Clinician: Referring Physician: Beverely RisenKHAN, FOZIA Treating Physician/Extender: Rudene ReBritto, Kailin Principato Weeks in Treatment: 4922 Verbal / Phone Orders: Yes Clinician: Curtis Sitesorthy, Joanna Read Back and Verified: Yes Diagnosis Coding Wound Cleansing Wound #2 Right,Proximal,Medial Malleolus o May shower with protection. Wound #5 Right,Proximal,Anterior Lower Leg o May shower with protection. Anesthetic Wound #2 Right,Proximal,Medial Malleolus o Topical Lidocaine 4% cream applied to wound bed prior to debridement Wound #5 Right,Proximal,Anterior Lower Leg o Topical Lidocaine 4% cream applied to wound bed prior to debridement Skin Barriers/Peri-Wound Care Wound #2 Right,Proximal,Medial Malleolus o Barrier cream o Moisturizing lotion Wound #5 Right,Proximal,Anterior Lower Leg o Barrier cream o Moisturizing lotion Primary Wound Dressing Wound #2 Right,Proximal,Medial Malleolus o Aquacel Ag Wound #5 Right,Proximal,Anterior Lower Leg o Aquacel Ag Secondary Dressing Wound #2 Right,Proximal,Medial Malleolus o Drawtex Wound #5 Right,Proximal,Anterior Lower Leg o Drawtex Grothe, Gloyd ([id number]) Dressing Change Frequency Wound #2 Right,Proximal,Medial Malleolus o Dressing is to be changed Monday and Thursday. Wound #5 Right,Proximal,Anterior Lower Leg o Dressing is to be changed Monday and Thursday. Follow-up Appointments Wound #2 Right,Proximal,Medial Malleolus o Return Appointment in 1 week. o Nurse Visit as needed - Thursday for dressing change Wound #5 Right,Proximal,Anterior Lower Leg o Return Appointment in 1 week. o Nurse Visit as needed - Thursday for dressing change Edema Control Wound #2 Right,Proximal,Medial Malleolus o Unna  Boot to Right Lower Extremity - zinc unna Wound #5 Right,Proximal,Anterior Lower Leg o Unna Boot to Right Lower Extremity - zinc unna Electronic Signature(s) Signed: 12/14/2014 12:26:09 PM By: Evlyn KannerBritto, Christianna Belmonte MD, FACS Signed: 12/14/2014 5:34:11 PM By: Curtis Sitesorthy, Joanna Entered By: Curtis Sitesorthy, Joanna on 12/14/2014 10:51:31 Maurice Sanders, Maurice Sanders ([id number]) -------------------------------------------------------------------------------- Problem List Details Patient Name: Maurice Sanders, Maurice Sanders Date of Service: 12/14/2014 9:45 AM Medical Record Number: [id number] Patient Account Number: [id number] Date of Birth/Sex: 09/12/1945 (68 y.o. 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 Male) Treating RN: Curtis Sitesorthy, Joanna Primary Care Physician: Beverely RisenKHAN, FOZIA Other Clinician: Referring Physician: Beverely RisenKHAN, FOZIA Treating Physician/Extender: Rudene ReBritto, Lonney Revak Weeks in Treatment: 22 Subjective Chief Complaint Information obtained from Patient [name] has been coming for dressing changes twice a week, on Monday and Thursday. Over all he is doing fine he has his blood sugar under control  and is managing his diuretics well. History of Present Illness (HPI) The following HPI elements were documented for the patient's wound: Location: right lower extremity Quality: Patient reports experiencing a dull pain to affected area(s). Severity: patient denies any severe pain. Duration: he has had this wound for a few weeks now. Timing: Pain in wound is Intermittent (comes and goes Context: the patient has a long history of venous insufficiency. He has been very compliant with wearing his compression stockings. Modifying Factors: the patient normally wears compression stockings. Recently he had placed a fair amount of Vaseline which cause skin irritation. Associated Signs and Symptoms: Patient denies any fevers. He  has had a moderate amount of drainage. The patient is 69 year-old male with a history of diabetes who presents with right lower extremity wound. He first presented to our clinic on 07/10/2014. He does have a history of venous insufficiency in the past. He has been very compliant with wearing his compression stockings. He recently Vaseline on his legs which caused skin irritation and breakdown. The patient returns for follow-up today. he denies any fevers at home. However due to the fact that he had stopped his diuretic he has had a lot of edema of the lower extremity. He also has a lot of scaly skin on his right lower extremity. 10/02/14 -- he has resumed his diuretics on a regular basis and has been walking around adequately and has overall improved. No pain or no discharge from the wounds. 10/09/14 -- the patient has been having some pain at night while sleeping and was requesting pain medications. He also says that due to taking his diuretics he's been getting some cramps and I have asked him to please see his PCP regarding both these problems. Also awaiting insurance clearance regarding his Apligraf. addendum: I have now got a clear copy of his vascular workup studies and  have reviewed the entire details dated encounter 08/17/2014. the lower extremity venous duplex study done on 08/17/2014 revealed no evidence of deep vein thrombosis or superficial thrombophlebitis bilaterally, no incompetence of the greater small saphenous veins bilaterally, enlarged lymph nodes in the groin bilaterally. As seen by Dr. Gilda Crease on the same day. Dr. Gilda Crease recommended that he should continue wearing graduated compression stockings of class I which is 20-30 mmHg pressure on a daily basis and a Bryden, Zavon ([id number]) prescription was given. He was to be reassessed in 6 months and the question of getting him lymphedema pumps were also discussed. 10/16/2014. Vascular note reviewed in detail. I have been able to review his notes from Dr. Gilda Crease on 08/17/2014. 1.The venous duplex study done on 08/17/2014 showed that all veins visualized showed no evidence of DVT. 2. no incompetence of the greater small saphenous veins bilaterally. The plan was that he had no surgery or intervention needed at this point. The long discussion had showed that the patient had chronic skin changes accompanied by venous insufficiency and at this stage he would benefit from wearing graduated compression stockings of the 20-30 mmHg on a daily basis and review would be done in his office back again in 6 months. At that time the patient could have a lymph pump. except for the new abrasion on the right lower extremity just below his tibial tuberosity he is doing fine and his ulcers actually look much better. 10/23/14 -- Culture report on Maurice Sanders --- His wound culture has grown Citrobacter koseri and Enterococcus faecalis. Final report is back but so far sensitive to Bactrim and ampicillin and ciprofloxacin. Bactrim DS was called in for 14 days. 11/13/2014 last week we changed his Unna boot on Monday and Thursday and this has helped him a lot. The edema has been much better the Unna boot hasn't  slipped down and he has not had any skin abrasions. 11/20/2014 -- he saw the vascular surgeon Dr. Gilda Crease this morning and he was pleased with his progress and has said he would be ordering him some lymphedema pumps. Other than that the patient is coming to change his Unna's boots twice a week and he is doing well with this. 11/27/2014 -- over Saturday and Sunday he started having more swelling  in his leg and some pain and I believe his own Unna's boot caused him some discomfort and excoriation of skin. He does admit he took some more juices orally but he has continued his diuretic. 12/11/2014 -- he has completed his course of antibiotics and has had no fresh issues. He has had no pain no swelling and is managing well with his blood sugars and diuretics. 12/14/2014 between Monday and today he has had some dietary indiscretions and had some [place] food which may have made him retain a lot of fluid. His edema has increased markedly and a lot of more excoriation. Objective Constitutional Pulse regular. Respirations normal and unlabored. Afebrile. Maurice Sanders, Maurice Sanders ([id number]) Vitals Time Taken: 10:10 AM, Height: 71 in, Weight: 291.7 lbs, BMI: 40.7, Temperature: 97.5 F, Pulse: 72 bpm, Respiratory Rate: 18 breaths/min, Blood Pressure: 173/100 mmHg. Eyes Nonicteric. Reactive to light. Ears, Nose, Mouth, and Throat Lips, teeth, and gums WNL.Marland Kitchen Moist mucosa without lesions . Neck supple and nontender. No palpable supraclavicular or cervical adenopathy. Normal sized without goiter. Respiratory WNL. No retractions.. Cardiovascular Pedal Pulses WNL. he has increasing edema on the right lower extremity and much more excoriation of the skin.Marland Kitchen Psychiatric Judgement and insight Intact.. No evidence of depression, anxiety, or agitation.. Integumentary (Hair, Skin) the ulcerated area on the right medial leg is still the same and there is minimal fibrin. No crepitus or fluctuance. No peri-wound warmth  or erythema. No masses.. Wound #2 status is Open. Original cause of wound was Gradually Appeared. The wound is located on the Right,Proximal,Medial Malleolus. The wound measures 4cm length x 4.5cm width x 0.2cm depth; 14.137cm^2 area and 2.827cm^3 volume. The wound is limited to skin breakdown. There is no tunneling or undermining noted. There is a medium amount of serous drainage noted. The wound margin is distinct with the outline attached to the wound base. There is medium (34-66%) red, pink granulation within the wound bed. There is a small (1-33%) amount of necrotic tissue within the wound bed including Adherent Slough. The periwound skin appearance exhibited: Scarring, Maceration, Moist, Hemosiderin Staining. The periwound skin appearance did not exhibit: Callus, Crepitus, Excoriation, Fluctuance, Friable, Induration, Localized Edema, Rash, Dry/Scaly, Atrophie Blanche, Cyanosis, Ecchymosis, Mottled, Pallor, Rubor, Erythema. Periwound temperature was noted as No Abnormality. The periwound has tenderness on palpation. Wound #5 status is Open. Original cause of wound was Gradually Appeared. The wound is located on the Right,Proximal,Anterior Lower Leg. The wound measures 2cm length x 2cm width x 0.1cm depth; 3.142cm^2 area and 0.314cm^3 volume. The wound is limited to skin breakdown. There is no tunneling or undermining noted. There is a small amount of serosanguineous drainage noted. The wound margin is distinct with the outline attached to the wound base. There is medium (34-66%) red granulation within the wound bed. There is a small (1-33%) amount of necrotic tissue within the wound bed including Adherent Slough. The periwound skin appearance exhibited: Dry/Scaly, Hemosiderin Staining. The periwound skin appearance did not exhibit: Callus, Crepitus, Excoriation, Fluctuance, Friable, Induration, Localized Edema, Rash, Scarring, Maceration, Moist, Atrophie Blanche, Cyanosis, Ecchymosis,  Mottled, Pallor, Rubor, Maurice Sanders, Maurice Sanders ([id number]) Erythema. Periwound temperature was noted as No Abnormality. 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 Wound #5 Right,Proximal,Anterior Lower Leg: May shower with protection. Anesthetic: Wound #2 Right,Proximal,Medial Malleolus: Topical Lidocaine 4% cream applied to wound bed prior to debridement Wound #  5 Right,Proximal,Anterior Lower Leg: Topical Lidocaine 4% cream applied to wound bed prior to debridement Skin Barriers/Peri-Wound Care: Wound #2 Right,Proximal,Medial Malleolus: Barrier cream Moisturizing lotion Wound #5 Right,Proximal,Anterior Lower Leg: Barrier cream Moisturizing lotion Primary Wound Dressing: Wound #2 Right,Proximal,Medial Malleolus: Aquacel Ag Wound #5 Right,Proximal,Anterior Lower Leg: Aquacel Ag Secondary Dressing: Wound #2 Right,Proximal,Medial Malleolus: Drawtex Wound #5 Right,Proximal,Anterior Lower Leg: Drawtex Dressing Change Frequency: Wound #2 Right,Proximal,Medial Malleolus: Dressing is to be changed Monday and Thursday. Maurice Sanders, Maurice Sanders ([id number]) Wound #5 Right,Proximal,Anterior Lower Leg: Dressing is to be changed Monday and Thursday. Follow-up Appointments: Wound #2 Right,Proximal,Medial Malleolus: Return Appointment in 1 week. Nurse Visit as needed - Thursday for dressing change Wound #5 Right,Proximal,Anterior Lower Leg: Return Appointment in 1 week. Nurse Visit as needed - Thursday for dressing change Edema Control: Wound #2 Right,Proximal,Medial Malleolus: Unna Boot to Right Lower Extremity - zinc unna Wound #5 Right,Proximal,Anterior Lower Leg: Unna Boot to Right Lower Extremity - zinc unna Due to the excoriation of the skin and changing over to  silver alginate all over and also changing to her own Unna's boot with zinc paste. He is urged to watch his diet and his diuretics and elevate his limb as much as possible. He will come back and see me on Monday. Electronic Signature(s) Signed: 12/14/2014 12:26:09 PM By: Evlyn KannerBritto, Helio Lack MD, FACS Entered By: Evlyn KannerBritto, Lynanne Delgreco on 12/14/2014 10:56:53 Maurice Sanders, Maurice Sanders ([id number]) -------------------------------------------------------------------------------- SuperBill Details Patient Name: Maurice Sanders, Kieffer Date of Service: 12/14/2014 Medical Record Number: [id number] Patient Account Number: [id number] Date of Birth/Sex: 03/15/1946 [age] y.o. 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 Facility Procedures CPT4: Description Modifier Quantity Code [id number] (Facility Use Only) [phone] - APPLY MULTLAY COMPRS LWR RT 1 LEG Physician Procedures CPT4 Code Description: [id number] - WC PHYS LEVEL 3 - EST PT ICD-10 Description Diagnosis 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

## 2014-12-18 ENCOUNTER — Encounter: Payer: Medicare Other | Admitting: Surgery

## 2014-12-18 DIAGNOSIS — E11620 Type 2 diabetes mellitus with diabetic dermatitis: Secondary | ICD-10-CM | POA: Diagnosis not present

## 2014-12-18 DIAGNOSIS — L97311 Non-pressure chronic ulcer of right ankle limited to breakdown of skin: Secondary | ICD-10-CM | POA: Diagnosis not present

## 2014-12-18 DIAGNOSIS — I87311 Chronic venous hypertension (idiopathic) with ulcer of right lower extremity: Secondary | ICD-10-CM | POA: Diagnosis not present

## 2014-12-18 DIAGNOSIS — I87309 Chronic venous hypertension (idiopathic) without complications of unspecified lower extremity: Secondary | ICD-10-CM | POA: Diagnosis not present

## 2014-12-18 NOTE — Progress Notes (Signed)
Maurice Sanders, Maurice Sanders ([id number]) Visit Report for 12/18/2014 Arrival Information Details Patient Name: Maurice Sanders, Maurice Sanders Date of Service: 12/18/2014 8:00 AM Medical Record Number: [id number] Patient Account Number: [id number] Date of Birth/Sex: 1946/03/25 (69 y.o. Male) Treating RN: Renee Harder Primary Care Physician: Beverely Risen Other Clinician: Referring Physician: Beverely Risen Treating Physician/Extender: Rudene Re in Treatment: 52 Visit Information History Since Last Visit Added or deleted any medications: Yes Patient Arrived: Ambulatory Any new allergies or adverse reactions: No Arrival Time: 08:06 Had a fall or experienced change in No Accompanied By: self activities of daily living that may affect Transfer Assistance: None risk of falls: Patient Requires Transmission- No Signs or symptoms of abuse/neglect since last No Based Precautions: visito Patient Has Alerts: Yes Hospitalized since last visit: No Patient Alerts: Patient on Blood Has Dressing in Place as Prescribed: Yes Thinner Has Compression in Place as Prescribed: Yes DM II ABI L 1.06; R Pain Present Now: Yes 1.13 Electronic Signature(s) Signed: 12/18/2014 4:30:08 PM By: Renee Harder RN Entered By: Renee Harder on 12/18/2014 08:08:42 Maurice Sanders ([id number]) -------------------------------------------------------------------------------- Encounter Discharge Information Details Patient Name: Maurice Sanders Date of Service: 12/18/2014 8:00 AM Medical Record Number: [id number] Patient Account Number: [id number] Date of Birth/Sex: 25-Jun-1946 (68 y.o. Male) Treating RN: Primary Care Physician: Beverely Risen Other Clinician: Referring Physician: Beverely Risen Treating Physician/Extender: Rudene Re in Treatment: 71 Encounter Discharge Information Items Schedule Follow-up Appointment: No Medication Reconciliation completed and No provided to Patient/Care Henley Boettner: Patient Clinical Summary of  Care: Declined Electronic Signature(s) Signed: 12/18/2014 8:54:47 AM By: Gwenlyn Perking Entered By: Gwenlyn Perking on 12/18/2014 08:54:47 Maurice Sanders ([id number]) -------------------------------------------------------------------------------- Lower Extremity Assessment Details Patient Name: Maurice Sanders Date of Service: 12/18/2014 8:00 AM Medical Record Number: [id number] Patient Account Number: [id number] Date of Birth/Sex: 1945-12-29 (68 y.o. Male) Treating RN: Renee Harder Primary Care Physician: Beverely Risen Other Clinician: Referring Physician: Beverely Risen Treating Physician/Extender: Rudene Re in Treatment: 23 Edema Assessment Assessed: [Left: No] [Right: Yes] Edema: [Left: Ye] [Right: s] Calf Left: Right: Point of Measurement: 33 cm From Medial Instep cm 39 cm Ankle Left: Right: Point of Measurement: 11 cm From Medial Instep cm 26.4 cm Vascular Assessment Claudication: Claudication Assessment [Right:None] Pulses: Posterior Tibial Palpable: [Right:Yes] Dorsalis Pedis Palpable: [Right:Yes] Extremity colors, hair growth, and conditions: Extremity Color: [Right:Hyperpigmented] Hair Growth on Extremity: [Right:No] Temperature of Extremity: [Right:Warm] Capillary Refill: [Right:< 3 seconds] Dependent Rubor: [Right:No] Blanched when Elevated: [Right:No] Toe Nail Assessment Left: Right: Thick: Yes Discolored: Yes Deformed: Yes Improper Length and Hygiene: No CHUNG, CHAGOYA ([id number]) Electronic Signature(s) Signed: 12/18/2014 4:30:08 PM By: Renee Harder RN Entered By: Renee Harder on 12/18/2014 08:19:12 Maurice Sanders ([id number]) -------------------------------------------------------------------------------- Multi Wound Chart Details Patient Name: Maurice Sanders Date of Service: 12/18/2014 8:00 AM Medical Record Number: [id number] Patient Account Number: [id number] Date of Birth/Sex: 1946/01/15 (69 y.o. Male) Treating RN: Curtis Sites Primary Care  Physician: Beverely Risen Other Clinician: Referring Physician: Beverely Risen Treating Physician/Extender: Rudene Re in Treatment: 23 Vital Signs Height(in): 71 Pulse(bpm): 68 Weight(lbs): 291.7 Blood Pressure 149/48 (mmHg): Body Mass Index(BMI): 41 Temperature(F): 97.9 Respiratory Rate 16 (breaths/min): Photos: [2:No Photos] [5:No Photos] [N/A:N/A] Wound Location: [2:Right Malleolus - Medial, Proximal] [5:Right Lower Leg - Anterior, Proximal] [N/A:N/A] Wounding Event: [2:Gradually Appeared] [5:Gradually Appeared] [N/A:N/A] Primary Etiology: [2:Venous Leg Ulcer] [5:Venous Leg Ulcer] [N/A:N/A] Comorbid History: [2:Sleep Apnea, Congestive Heart Failure, Deep Vein Thrombosis, Hypertension, Type II Diabetes, Gout] [5:Sleep Apnea, Congestive Heart Failure, Deep Vein Thrombosis, Hypertension, Type II Diabetes, Gout] [N/A:N/A] Date Acquired: [2:07/31/2014] [5:10/11/2014] [N/A:N/A] Weeks  of Treatment: [2:20] [5:9] [N/A:N/A] Wound Status: [2:Open] [5:Healed - Epithelialized] [N/A:N/A] Measurements L x W x D 7.8x6x0.2 [5:0x0x0] [N/A:N/A] (cm) Area (cm) : [2:36.757] [5:0] [N/A:N/A] Volume (cm) : [2:7.351] [5:0] [N/A:N/A] % Reduction in Area: [2:-5099.00%] [5:100.00%] [N/A:N/A] % Reduction in Volume: -10253.50% [5:100.00%] [N/A:N/A] Classification: [2:Full Thickness Without Exposed Support Structures] [5:Partial Thickness] [N/A:N/A] HBO Classification: [2:Grade 1] [5:Grade 1] [N/A:N/A] Exudate Amount: [2:Medium] [5:Small] [N/A:N/A] Exudate Type: [2:Serosanguineous] [5:Serosanguineous] [N/A:N/A] Exudate Color: [2:red, brown] [5:red, brown] [N/A:N/A] Wound Margin: [2:Distinct, outline attached] [5:Distinct, outline attached] [N/A:N/A] Granulation Amount: [2:Medium (34-66%)] [5:None Present (0%)] [N/A:N/A] Granulation Quality: [2:Red, Pink] [5:N/A] [N/A:N/A] Necrotic Amount: [2:Small (1-33%)] [5:None Present (0%)] [N/A:N/A] Exposed Structures: Fascia: No Fascia: No N/A Fat:  No Fat: No Tendon: No Tendon: No Muscle: No Muscle: No Joint: No Joint: No Bone: No Bone: No Limited to Skin Limited to Skin Breakdown Breakdown Epithelialization: Medium (34-66%) Large (67-100%) N/A Periwound Skin Texture: Scarring: Yes Edema: No N/A Edema: No Excoriation: No Excoriation: No Induration: No Induration: No Callus: No Callus: No Crepitus: No Crepitus: No Fluctuance: No Fluctuance: No Friable: No Friable: No Rash: No Rash: No Scarring: No Periwound Skin Maceration: Yes Dry/Scaly: Yes N/A Moisture: Moist: Yes Maceration: No Dry/Scaly: No Moist: No Periwound Skin Color: Hemosiderin Staining: Yes Hemosiderin Staining: Yes N/A Atrophie Blanche: No Atrophie Blanche: No Cyanosis: No Cyanosis: No Ecchymosis: No Ecchymosis: No Erythema: No Erythema: No Mottled: No Mottled: No Pallor: No Pallor: No Rubor: No Rubor: No Temperature: No Abnormality No Abnormality N/A Tenderness on Yes No N/A Palpation: Wound Preparation: Ulcer Cleansing: Ulcer Cleansing: N/A Rinsed/Irrigated with Rinsed/Irrigated with Saline, Other: water and Saline soap Topical Anesthetic Topical Anesthetic Applied: None Applied: Other: lidocaine 4% Treatment Notes Electronic Signature(s) Signed: 12/18/2014 2:29:15 PM By: Curtis Sitesorthy, Joanna Entered By: Curtis Sitesorthy, Joanna on 12/18/2014 08:35:18 Maurice Sanders, Maurice Sanders ([id number]) -------------------------------------------------------------------------------- Multi-Disciplinary Care Plan Details Patient Name: Maurice Sanders, Maurice Sanders Date of Service: 12/18/2014 8:00 AM Medical Record Number: [id number] Patient Account Number: [id number] Date of Birth/Sex: 06/18/1946 (68 y.o. Male) Treating RN: Curtis Sitesorthy, Joanna Primary Care Physician: Beverely RisenKHAN, FOZIA Other Clinician: Referring Physician: Beverely RisenKHAN, FOZIA Treating Physician/Extender: Rudene ReBritto, Errol Weeks in Treatment: 7023 Active Inactive Abuse / Safety / Falls / Self Care Management Nursing  Diagnoses: Potential for falls Goals: Patient will remain injury free Date Initiated: 07/10/2014 Goal Status: Active Interventions: Assess fall risk on admission and as needed Notes: Nutrition Nursing Diagnoses: Potential for alteratiion in Nutrition/Potential for imbalanced nutrition Goals: Patient/caregiver agrees to and verbalizes understanding of need to use nutritional supplements and/or vitamins as prescribed Date Initiated: 07/10/2014 Goal Status: Active Interventions: Assess patient nutrition upon admission and as needed per policy Notes: Orientation to the Wound Care Program Nursing Diagnoses: Knowledge deficit related to the wound healing center program Goals: Patient/caregiver will verbalize understanding of the Wound Healing Center Program [place] ([id number]) Date Initiated: 07/10/2014 Goal Status: Active Interventions: Provide education on orientation to the wound center Notes: Wound/Skin Impairment Nursing Diagnoses: Impaired tissue integrity Goals: Ulcer/skin breakdown will have a volume reduction of 30% by week 4 Date Initiated: 07/10/2014 Goal Status: Active Interventions: Assess ulceration(s) every visit Notes: Electronic Signature(s) Signed: 12/18/2014 2:29:15 PM By: Curtis Sitesorthy, Joanna Entered By: Curtis Sitesorthy, Joanna on 12/18/2014 08:35:04 Maurice Sanders, Maurice Sanders ([id number]) -------------------------------------------------------------------------------- Pain Assessment Details Patient Name: Maurice Sanders, Maurice Sanders Date of Service: 12/18/2014 8:00 AM Medical Record Number: [id number] Patient Account Number: [id number] Date of Birth/Sex: 09/15/1945 [age] y.o. Male) Treating RN: Renee HarderMabry, Kelsey Primary Care Physician: Beverely RisenKHAN, FOZIA Other Clinician: Referring Physician: Beverely RisenKHAN, FOZIA Treating Physician/Extender: Rudene ReBritto, Errol Weeks in Treatment: 1323 Active  Problems Location of Pain Severity and Description of Pain Patient Has Paino Yes Site Locations Pain Location: Pain in  Ulcers With Dressing Change: Yes Rate the pain. Current Pain Level: 2 Worst Pain Level: 8 Least Pain Level: 2 Character of Pain Describe the Pain: Aching, Sharp Pain Management and Medication Current Pain Management: Electronic Signature(s) Signed: 12/18/2014 4:30:08 PM By: Renee Harder RN Entered By: Renee Harder on 12/18/2014 08:09:01 Maurice Sanders ([id number]) -------------------------------------------------------------------------------- Patient/Caregiver Education Details Patient Name: Maurice Sanders Date of Service: 12/18/2014 8:00 AM Medical Record Number: [id number] Patient Account Number: [id number] Date of Birth/Gender: Oct 30, 1945 (68 y.o. Male) Treating RN: Curtis Sites Primary Care Physician: Beverely Risen Other Clinician: Referring Physician: Beverely Risen Treating Physician/Extender: Rudene Re in Treatment: 45 Education Assessment Education Provided To: Patient Education Topics Provided Wound/Skin Impairment: Handouts: Skin Care Do's and Dont's Methods: Demonstration, Explain/Verbal Responses: State content correctly Electronic Signature(s) Signed: 12/18/2014 2:29:15 PM By: Curtis Sites Entered By: Curtis Sites on 12/18/2014 08:38:58 Maurice Sanders ([id number]) -------------------------------------------------------------------------------- Wound Assessment Details Patient Name: Maurice Sanders Date of Service: 12/18/2014 8:00 AM Medical Record Number: [id number] Patient Account Number: [id number] Date of Birth/Sex: 30-Apr-1946 (68 y.o. Male) Treating RN: Renee Harder Primary Care Physician: Beverely Risen Other Clinician: Referring Physician: Beverely Risen Treating Physician/Extender: Rudene Re in Treatment: 23 Wound Status Wound Number: 2 Primary Venous Leg Ulcer Etiology: Wound Location: Right Malleolus - Medial, Proximal Wound Open Status: Wounding Event: Gradually Appeared Comorbid Sleep Apnea, Congestive Heart Failure, Date  Acquired: 07/31/2014 History: Deep Vein Thrombosis, Hypertension, Weeks Of Treatment: 20 Type II Diabetes, Gout Clustered Wound: No Photos Photo Uploaded By: Renee Harder on 12/18/2014 14:32:42 Wound Measurements Length: (cm) 7.8 Width: (cm) 6 Depth: (cm) 0.2 Area: (cm) 36.757 Volume: (cm) 7.351 % Reduction in Area: -5099% % Reduction in Volume: -10253.5% Epithelialization: Medium (34-66%) Tunneling: No Undermining: No Wound Description Full Thickness Without Exposed Foul Odor A Classification: Support Structures Diabetic Severity Grade 1 (Wagner): [place], [name] ([id number]) fter Cleansing: No Wound Margin: Distinct, outline attached Exudate Amount: Medium Exudate Type: Serosanguineous Exudate Color: red, brown Wound Bed Granulation Amount: Medium (34-66%) Exposed Structure Granulation Quality: Red, Pink Fascia Exposed: No Necrotic Amount: Small (1-33%) Fat Layer Exposed: No Necrotic Quality: Adherent Slough Tendon Exposed: No Muscle Exposed: No Joint Exposed: No Bone Exposed: No Limited to Skin Breakdown Periwound Skin Texture Texture Color No Abnormalities Noted: No No Abnormalities Noted: No Callus: No Atrophie Blanche: No Crepitus: No Cyanosis: No Excoriation: No Ecchymosis: No Fluctuance: No Erythema: No Friable: No Hemosiderin Staining: Yes Induration: No Mottled: No Localized Edema: No Pallor: No Rash: No Rubor: No Scarring: Yes Temperature / Pain Moisture Temperature: No Abnormality No Abnormalities Noted: No Tenderness on Palpation: Yes Dry / Scaly: No Maceration: Yes Moist: Yes Wound Preparation Ulcer Cleansing: Rinsed/Irrigated with Saline, Other: water and soap, Topical Anesthetic Applied: Other: lidocaine 4%, Treatment Notes Wound #2 (Right, Proximal, Medial Malleolus) 1. Cleansed with: Clean wound with Normal Saline 2. Anesthetic Topical Lidocaine 4% cream to wound bed prior to debridement 3. Peri-wound Care: Barrier  cream 4. Dressing Applied: MALAKHI, MARKWOOD ([id number]) Acticoat 7 Contact layer 5. Secondary Dressing Applied ABD and Kerlix/Conform 7. Secured with 2 Layer Compression System - Right Lower Extremity Notes drawtex applied over acticoat 7. Restore contact layer applied over draining areas of leg. Electronic Signature(s) Signed: 12/18/2014 4:30:08 PM By: Renee Harder RN Entered By: Renee Harder on 12/18/2014 08:22:16 Maurice Sanders ([id number]) -------------------------------------------------------------------------------- Wound Assessment Details Patient Name: Maurice Sanders Date of Service: 12/18/2014 8:00 AM Medical Record Number:  [id number] Patient Account Number: [id number] Date of Birth/Sex: 08/05/1945 [age] y.o. Male) Treating RN: Renee HarderMabry, Kelsey Primary Care Physician: Beverely RisenKHAN, FOZIA Other Clinician: Referring Physician: Beverely RisenKHAN, FOZIA Treating Physician/Extender: Rudene ReBritto, Errol Weeks in Treatment: 23 Wound Status Wound Number: 5 Primary Venous Leg Ulcer Etiology: Wound Location: Right Lower Leg - Anterior, Proximal Wound Healed - Epithelialized Status: Wounding Event: Gradually Appeared Comorbid Sleep Apnea, Congestive Heart Failure, Date Acquired: 10/11/2014 History: Deep Vein Thrombosis, Hypertension, Weeks Of Treatment: 9 Type II Diabetes, Gout Clustered Wound: No Photos Photo Uploaded By: Renee HarderMabry, Kelsey on 12/18/2014 14:33:32 Wound Measurements Length: (cm) 0 % Redu Width: (cm) 0 % Redu Depth: (cm) 0 Epithe Area: (cm) 0 Tunne Volume: (cm) 0 Under ction in Area: 100% ction in Volume: 100% lialization: Large (67-100%) ling: No mining: No Wound Description Classification: Partial Thickness Foul Diabetic Severity (Wagner): Grade 1 Wound Margin: Distinct, outline attached Exudate Amount: Small Exudate Type: Serosanguineous Exudate Color: red, brown Odor After Cleansing: No Wound Bed Granulation Amount: None Present (0%) Exposed Structure Necrotic Amount: None  Present (0%) Fascia Exposed: No Cheema, Zuri ([id number]) Fat Layer Exposed: No Tendon Exposed: No Muscle Exposed: No Joint Exposed: No Bone Exposed: No Limited to Skin Breakdown Periwound Skin Texture Texture Color No Abnormalities Noted: No No Abnormalities Noted: No Callus: No Atrophie Blanche: No Crepitus: No Cyanosis: No Excoriation: No Ecchymosis: No Fluctuance: No Erythema: No Friable: No Hemosiderin Staining: Yes Induration: No Mottled: No Localized Edema: No Pallor: No Rash: No Rubor: No Scarring: No Temperature / Pain Moisture Temperature: No Abnormality No Abnormalities Noted: No Dry / Scaly: Yes Maceration: No Moist: No Wound Preparation Ulcer Cleansing: Rinsed/Irrigated with Saline Topical Anesthetic Applied: None Electronic Signature(s) Signed: 12/18/2014 4:30:08 PM By: Renee HarderMabry, Kelsey RN Entered By: Renee HarderMabry, Kelsey on 12/18/2014 08:22:40 Maurice Sanders, Zyrion ([id number]) -------------------------------------------------------------------------------- Vitals Details Patient Name: Maurice Sanders, Maurice Sanders Date of Service: 12/18/2014 8:00 AM Medical Record Number: [id number] Patient Account Number: [id number] Date of Birth/Sex: 11/24/1945 [age] y.o. Male) Treating RN: Renee HarderMabry, Kelsey Primary Care Physician: Beverely RisenKHAN, FOZIA Other Clinician: Referring Physician: Beverely RisenKHAN, FOZIA Treating Physician/Extender: Rudene ReBritto, Errol Weeks in Treatment: 7423 Vital Signs Time Taken: 08:13 Temperature (F): 97.9 Height (in): 71 Pulse (bpm): 68 Weight (lbs): 291.7 Respiratory Rate (breaths/min): 16 Body Mass Index (BMI): 40.7 Blood Pressure (mmHg): 149/48 Reference Range: 80 - 120 mg / dl Electronic Signature(s) Signed: 12/18/2014 4:30:08 PM By: Renee HarderMabry, Kelsey RN Entered By: Renee HarderMabry, Kelsey on 12/18/2014 [id number]:09:24

## 2014-12-18 NOTE — Progress Notes (Signed)
Maurice Sanders, Maurice Sanders ([id number]) Visit Report for 12/18/2014 Chief Complaint Document Details Patient Name: Maurice Sanders, Maurice Sanders Date of Service: 12/18/2014 8:00 AM Medical Record Number: [id number] Patient Account Number: [id number] Date of Birth/Sex: 05/29/1946 (69 y.o. Male) Treating RN: Primary Care Physician: Beverely RisenKHAN, FOZIA Other Clinician: Referring Physician: Beverely RisenKHAN, FOZIA Treating Physician/Extender: Rudene ReBritto, Nishi Neiswonger Weeks in Treatment: 2523 Information Obtained from: Patient Chief Complaint Maurice Sanders has been coming for dressing changes twice a week, on Monday and Thursday. Over all he is doing fine he has his blood sugar under control and is managing his diuretics well. after changing over from calamine to zinc oxide he said his own Unna's boot still gave him a bit of itching and problems and he had significant swelling which made the Unna's boot fairly tight. Electronic Signature(s) Signed: 12/18/2014 12:30:09 PM By: Evlyn KannerBritto, Arhianna Ebey MD, FACS Entered By: Evlyn KannerBritto, Mimi Debellis on 12/18/2014 08:50:46 Maurice Sanders, Maurice Sanders ([id number]) -------------------------------------------------------------------------------- HPI Details Patient Name: Maurice Sanders, Maurice Sanders Date of Service: 12/18/2014 8:00 AM Medical Record Number: [id number] Patient Account Number: [id number] Date of Birth/Sex: 05/01/1946 (68 y.o. Male) Treating RN: Primary Care Physician: Beverely RisenKHAN, FOZIA Other Clinician: Referring Physician: Beverely RisenKHAN, FOZIA Treating Physician/Extender: Rudene ReBritto, Tashanna Dolin Weeks in Treatment: 23 History of Present Illness Location: right lower extremity Quality: Patient reports experiencing a dull pain to affected area(s). Severity: patient denies any severe pain. Duration: he has had this wound for a few weeks now. Timing: Pain in wound is Intermittent (comes and goes Context: the patient has a long history of venous insufficiency. He has been very compliant with wearing his compression stockings. Modifying Factors: the patient normally wears  compression stockings. Recently he had placed a fair amount of Vaseline which cause skin irritation. Associated Signs and Symptoms: Patient denies any fevers. He has had a moderate amount of drainage. HPI Description: The patient is 69 year-old male with a history of diabetes who presents with right lower extremity wound. He first presented to our clinic on 07/10/2014. He does have a history of venous insufficiency in the past. He has been very compliant with wearing his compression stockings. He recently Vaseline on his legs which caused skin irritation and breakdown. The patient returns for follow-up today. he denies any fevers at home. However due to the fact that he had stopped his diuretic he has had a lot of edema of the lower extremity. He also has a lot of scaly skin on his right lower extremity. 10/02/14 -- he has resumed his diuretics on a regular basis and has been walking around adequately and has overall improved. No pain or no discharge from the wounds. 10/09/14 -- the patient has been having some pain at night while sleeping and was requesting pain medications. He also says that due to taking his diuretics he's been getting some cramps and I have asked him to please see his PCP regarding both these problems. Also awaiting insurance clearance regarding his Apligraf. addendum: I have now got a clear copy of his vascular workup studies and have reviewed the entire details dated encounter 08/17/2014. the lower extremity venous duplex study done on 08/17/2014 revealed no evidence of deep vein thrombosis or superficial thrombophlebitis bilaterally, no incompetence of the greater small saphenous veins bilaterally, enlarged lymph nodes in the groin bilaterally. As seen by Dr. Gilda CreaseSchnier on the same day. Dr. Gilda CreaseSchnier recommended that he should continue wearing graduated compression stockings of class I which is 20-30 mmHg pressure on a daily basis and a prescription was given. He was to be  reassessed in 6 months and the question  of getting him lymphedema pumps were also discussed. 10/16/2014. Vascular note reviewed in detail. I have been able to review his notes from Dr. Gilda CreaseSchnier on 08/17/2014. 1.The venous duplex study done on 08/17/2014 showed that all veins visualized showed no evidence of DVT. 2. no incompetence of the greater small saphenous veins bilaterally. Maurice Sanders, Maurice Sanders ([id number]) The plan was that he had no surgery or intervention needed at this point. The long discussion had showed that the patient had chronic skin changes accompanied by venous insufficiency and at this stage he would benefit from wearing graduated compression stockings of the 20-30 mmHg on a daily basis and review would be done in his office back again in 6 months. At that time the patient could have a lymph pump. except for the new abrasion on the right lower extremity just below his tibial tuberosity he is doing fine and his ulcers actually look much better. 10/23/14 -- Culture report on Maurice BayleyAnthony Sanders --- His wound culture has grown Citrobacter koseri and Enterococcus faecalis. Final report is back but so far sensitive to Bactrim and ampicillin and ciprofloxacin. Bactrim DS was called in for 14 days. 11/13/2014 last week we changed his Unna boot on Monday and Thursday and this has helped him a lot. The edema has been much better the Unna boot hasn't slipped down and he has not had any skin abrasions. 11/20/2014 -- he saw the vascular surgeon Dr. Gilda CreaseSchnier this morning and he was pleased with his progress and has said he would be ordering him some lymphedema pumps. Other than that the patient is coming to change his Unna's boots twice a week and he is doing well with this. 11/27/2014 -- over Saturday and Sunday he started having more swelling in his leg and some pain and I believe his own Unna's boot caused him some discomfort and excoriation of skin. He does admit he took some more juices orally  but he has continued his diuretic. 12/11/2014 -- he has completed his course of antibiotics and has had no fresh issues. He has had no pain no swelling and is managing well with his blood sugars and diuretics. 12/14/2014 between Monday and today he has had some dietary indiscretions and had some [place] food which may have made him retain a lot of fluid. His edema has increased markedly and a lot of more excoriation. Electronic Signature(s) Signed: 12/18/2014 12:30:09 PM By: Evlyn KannerBritto, Eliana Lueth MD, FACS Entered By: Evlyn KannerBritto, Jearl Soto on 12/18/2014 08:50:56 Maurice Sanders, Maurice Sanders ([id number]) -------------------------------------------------------------------------------- Physical Exam Details Patient Name: Maurice Sanders, Maurice Sanders Date of Service: 12/18/2014 8:00 AM Medical Record Number: [id number] Patient Account Number: [id number] Date of Birth/Sex: 07/14/1946 (68 y.o. Male) Treating RN: Primary Care Physician: Beverely RisenKHAN, FOZIA Other Clinician: Referring Physician: Beverely RisenKHAN, FOZIA Treating Physician/Extender: Rudene ReBritto, Migdalia Olejniczak Weeks in Treatment: 23 Constitutional . Pulse regular. Respirations normal and unlabored. Afebrile. . Eyes Nonicteric. Reactive to light. Ears, Nose, Mouth, and Throat Lips, teeth, and gums WNL.Marland Kitchen. Moist mucosa without lesions . Neck supple and nontender. No palpable supraclavicular or cervical adenopathy. Normal sized without goiter. Respiratory WNL. No retractions.. Cardiovascular Pedal Pulses WNL. the edema is not as much as last week but other than that there is still some excoriation and ulceration.. Integumentary (Hair, Skin) most of the upper leg has healed nicely and there is a very small amount of raw area. The ulceration by itself on the medial part of lower extremity looks clean and does not need debridement today.Marland Kitchen. Psychiatric Judgement and insight Intact.. No evidence of depression, anxiety, or agitation.. Electronic Signature(s)  Signed: 12/18/2014 12:30:09 PM By: Evlyn Kanner MD,  FACS Entered By: Evlyn Kanner on 12/18/2014 08:52:15 Maurice Sanders ([id number]) -------------------------------------------------------------------------------- Physician Orders Details Patient Name: Maurice Sanders Date of Service: 12/18/2014 8:00 AM Medical Record Number: [id number] Patient Account Number: [id number] Date of Birth/Sex: 1946-04-24 (68 y.o. Male) Treating RN: Curtis Sites Primary Care Physician: Beverely Risen Other Clinician: Referring Physician: Beverely Risen Treating Physician/Extender: Rudene Re in Treatment: 27 Verbal / Phone Orders: Yes Clinician: Curtis Sites Read Back and Verified: Yes Diagnosis Coding Wound Cleansing Wound #2 Right,Proximal,Medial Malleolus o May shower with protection. Anesthetic Wound #2 Right,Proximal,Medial Malleolus o Topical Lidocaine 4% cream applied to wound bed prior to debridement Skin Barriers/Peri-Wound Care Wound #2 Right,Proximal,Medial Malleolus o Barrier cream o Moisturizing lotion Primary Wound Dressing Wound #2 Right,Proximal,Medial Malleolus o Acticoat 7 Secondary Dressing Wound #2 Right,Proximal,Medial Malleolus o Contact Layer o Drawtex Dressing Change Frequency Wound #2 Right,Proximal,Medial Malleolus o Dressing is to be changed Monday and Thursday. Follow-up Appointments Wound #2 Right,Proximal,Medial Malleolus o Return Appointment in 1 week. o Nurse Visit as needed - Thursday for dressing change Edema Control Wound #2 Right,Proximal,Medial Malleolus o 2 Layer Compression System - Right Lower Extremity CAROLYN, MANISCALCO ([id number]) Electronic Signature(s) Signed: 12/18/2014 12:30:09 PM By: Evlyn Kanner MD, FACS Signed: 12/18/2014 2:29:15 PM By: Curtis Sites Entered By: Curtis Sites on 12/18/2014 08:38:26 Maurice Sanders ([id number]) -------------------------------------------------------------------------------- Problem List Details Patient Name: Maurice Sanders Date of  Service: 12/18/2014 8:00 AM Medical Record Number: [id number] Patient Account Number: [id number] Date of Birth/Sex: Dec 06, 1945 (68 y.o. 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 Male) Treating RN: Primary Care Physician: Beverely Risen Other Clinician: Referring Physician: Beverely Risen Treating Physician/Extender: Rudene Re in Treatment: 81 Subjective Chief Complaint Information obtained from Patient [place] has been coming for dressing changes twice a week, on Monday and Thursday. Over all he is doing fine he has his blood sugar under control and is managing his diuretics well. after changing over from calamine to zinc oxide he said his own Unna's boot still gave him a bit of itching and problems and he had significant swelling which made the Unna's boot fairly tight. History of Present Illness (HPI) The following HPI elements were documented for the patient's wound: Location: right lower extremity Quality: Patient reports experiencing a dull pain to affected area(s). Severity: patient denies any severe pain. Duration: he has had this wound for a  few weeks now. Timing: Pain in wound is Intermittent (comes and goes Context: the patient has a long history of venous insufficiency. He has been very compliant with wearing his compression stockings. Modifying Factors: the patient normally wears compression stockings. Recently he had placed a fair amount of Vaseline which cause skin irritation. Associated Signs and Symptoms: Patient denies any fevers. He has had a moderate amount of drainage. The patient is 69 year-old male with a history of diabetes who presents with right lower extremity wound. He first presented  to our clinic on 07/10/2014. He does have a history of venous insufficiency in the past. He has been very compliant with wearing his compression stockings. He recently Vaseline on his legs which caused skin irritation and breakdown. The patient returns for follow-up today. he denies any fevers at home. However due to the fact that he had stopped his diuretic he has had a lot of edema of the lower extremity. He also has a lot of scaly skin on his right lower extremity. 10/02/14 -- he has resumed his diuretics on a regular basis and has been walking around adequately and has overall improved. No pain or no discharge from the wounds. 10/09/14 -- the patient has been having some pain at night while sleeping and was requesting pain medications. He also says that due to taking his diuretics he's been getting some cramps and I have asked him to please see his PCP regarding both these problems. Also awaiting insurance clearance regarding his Apligraf. addendum: I have now got a clear copy of his vascular workup studies and have reviewed the entire details dated encounter 08/17/2014. the lower extremity venous duplex study done on 08/17/2014 revealed no evidence of deep vein thrombosis or superficial thrombophlebitis bilaterally, no incompetence of the greater small saphenous veins bilaterally, enlarged lymph nodes in the groin  bilaterally. SELSO, MANNOR ([id number]) As seen by Dr. Gilda Crease on the same day. Dr. Gilda Crease recommended that he should continue wearing graduated compression stockings of class I which is 20-30 mmHg pressure on a daily basis and a prescription was given. He was to be reassessed in 6 months and the question of getting him lymphedema pumps were also discussed. 10/16/2014. Vascular note reviewed in detail. I have been able to review his notes from Dr. Gilda Crease on 08/17/2014. 1.The venous duplex study done on 08/17/2014 showed that all veins visualized showed no evidence of DVT. 2. no incompetence of the greater small saphenous veins bilaterally. The plan was that he had no surgery or intervention needed at this point. The long discussion had showed that the patient had chronic skin changes accompanied by venous insufficiency and at this stage he would benefit from wearing graduated compression stockings of the 20-30 mmHg on a daily basis and review would be done in his office back again in 6 months. At that time the patient could have a lymph pump. except for the new abrasion on the right lower extremity just below his tibial tuberosity he is doing fine and his ulcers actually look much better. 10/23/14 -- Culture report on Maurice Sanders --- His wound culture has grown Citrobacter koseri and Enterococcus faecalis. Final report is back but so far sensitive to Bactrim and ampicillin and ciprofloxacin. Bactrim DS was called in for 14 days. 11/13/2014 last week we changed his Unna boot on Monday and Thursday and this has helped him a lot. The edema has been much better the Unna boot hasn't slipped down and he has not had any skin abrasions. 11/20/2014 -- he saw the vascular surgeon Dr. Gilda Crease this morning and he was pleased with his progress and has said he would be ordering him some lymphedema pumps. Other than that the patient is coming to change his Unna's boots twice a week and he is doing well  with this. 11/27/2014 -- over Saturday and Sunday he started having more swelling in his leg and some pain and I believe his own Unna's boot caused him some discomfort and excoriation of skin. He does admit he took some more  juices orally but he has continued his diuretic. 12/11/2014 -- he has completed his course of antibiotics and has had no fresh issues. He has had no pain no swelling and is managing well with his blood sugars and diuretics. 12/14/2014 between Monday and today he has had some dietary indiscretions and had some [place] food which may have made him retain a lot of fluid. His edema has increased markedly and a lot of more excoriation. Objective Constitutional Turck, Nickalus ([id number]) Pulse regular. Respirations normal and unlabored. Afebrile. Vitals Time Taken: 8:13 AM, Height: 71 in, Weight: 291.7 lbs, BMI: 40.7, Temperature: 97.9 F, Pulse: 68 bpm, Respiratory Rate: 16 breaths/min, Blood Pressure: 149/48 mmHg. Eyes Nonicteric. Reactive to light. Ears, Nose, Mouth, and Throat Lips, teeth, and gums WNL.Marland Kitchen Moist mucosa without lesions . Neck supple and nontender. No palpable supraclavicular or cervical adenopathy. Normal sized without goiter. Respiratory WNL. No retractions.. Cardiovascular Pedal Pulses WNL. the edema is not as much as last week but other than that there is still some excoriation and ulceration.Marland Kitchen Psychiatric Judgement and insight Intact.. No evidence of depression, anxiety, or agitation.. Integumentary (Hair, Skin) most of the upper leg has healed nicely and there is a very small amount of raw area. The ulceration by itself on the medial part of lower extremity looks clean and does not need debridement today.. Wound #2 status is Open. Original cause of wound was Gradually Appeared. The wound is located on the Right,Proximal,Medial Malleolus. The wound measures 7.8cm length x 6cm width x 0.2cm depth; 36.757cm^2 area and 7.351cm^3 volume. The wound is  limited to skin breakdown. There is no tunneling or undermining noted. There is a medium amount of serosanguineous drainage noted. The wound margin is distinct with the outline attached to the wound base. There is medium (34-66%) red, pink granulation within the wound bed. There is a small (1-33%) amount of necrotic tissue within the wound bed including Adherent Slough. The periwound skin appearance exhibited: Scarring, Maceration, Moist, Hemosiderin Staining. The periwound skin appearance did not exhibit: Callus, Crepitus, Excoriation, Fluctuance, Friable, Induration, Localized Edema, Rash, Dry/Scaly, Atrophie Blanche, Cyanosis, Ecchymosis, Mottled, Pallor, Rubor, Erythema. Periwound temperature was noted as No Abnormality. The periwound has tenderness on palpation. Wound #5 status is Healed - Epithelialized. Original cause of wound was Gradually Appeared. The wound is located on the Right,Proximal,Anterior Lower Leg. The wound measures 0cm length x 0cm width x 0cm depth; 0cm^2 area and 0cm^3 volume. The wound is limited to skin breakdown. There is no tunneling or undermining noted. There is a small amount of serosanguineous drainage noted. The wound margin is distinct with the outline attached to the wound base. There is no granulation within the wound bed. There is no necrotic tissue within the wound bed. The periwound skin appearance exhibited: Dry/Scaly, Hemosiderin Staining. The periwound skin appearance did not exhibit: Callus, Crepitus, Excoriation, Fluctuance, Friable, Induration, Localized Edema, Rash, Scarring, Maceration, Moist, Atrophie Blanche, Cyanosis, Ecchymosis, Barretta, [place] ([id number]) Mottled, Pallor, Rubor, Erythema. Periwound temperature was noted as No Abnormality. 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 In spite of this he has good days and bad and this time around I'm going to try a 4-layer compression with a appropriate application of Acticoat 7 for the low  wound and protect the raw surface on the upper part of his leg with some Mepitel. I have urged him to keep a close watch on his diet and his diabetes and report 2 hours if there is any tightness or itching which is unbearable so that he can come earlier than Thursday if required. Plan Wound Cleansing: Wound #2 Right,Proximal,Medial Malleolus: May shower with protection. Anesthetic: Wound #2 Right,Proximal,Medial Malleolus: Topical Lidocaine 4% cream applied to wound bed prior to debridement Skin Barriers/Peri-Wound Care: Wound #2 Right,Proximal,Medial Malleolus: Barrier cream Moisturizing lotion Primary Wound Dressing: Wound #2 Right,Proximal,Medial Malleolus: Acticoat 7 Secondary Dressing: Wound #2 Right,Proximal,Medial Malleolus: Contact Layer Drawtex Dressing Change Frequency: Wound #2 Right,Proximal,Medial Malleolus: Dressing is to be changed Monday and Thursday. Follow-up Appointments: KASH, DAVIE ([id number]) Wound #2 Right,Proximal,Medial Malleolus: Return Appointment in 1 week. Nurse Visit as needed - Thursday for dressing change Edema Control: Wound #2 Right,Proximal,Medial Malleolus: 2 Layer Compression System - Right Lower Extremity It has been difficult to control his edema and have an appropriate wrap which fits well and for the last month he's been having twice a week dressings. In spite of this he has good days and bad and this time around I'm going to try a 4-layer compression with a appropriate application of Acticoat 7 for the low wound and protect the raw surface on the upper part of his leg with some Mepitel. I have urged him to keep a close watch on his diet and his diabetes  and report 2 hours if there is any tightness or itching which is unbearable so that he can come earlier than Thursday if required. Electronic Signature(s) Signed: 12/18/2014 1:04:49 PM By: Evlyn Kanner MD, FACS Previous Signature: 12/18/2014 12:30:09 PM Version By: Evlyn Kanner MD, FACS Entered By: Evlyn Kanner on 12/18/2014 13:04:06 Maurice Sanders ([id number]) -------------------------------------------------------------------------------- SuperBill Details Patient Name: Maurice Sanders Date of Service: 12/18/2014 Medical Record Number: [id number] Patient Account Number: [id number] Date of Birth/Sex: 1945/12/08 (69 y.o. 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

## 2014-12-21 DIAGNOSIS — I87309 Chronic venous hypertension (idiopathic) without complications of unspecified lower extremity: Secondary | ICD-10-CM | POA: Diagnosis not present

## 2014-12-21 DIAGNOSIS — E11620 Type 2 diabetes mellitus with diabetic dermatitis: Secondary | ICD-10-CM | POA: Diagnosis not present

## 2014-12-22 NOTE — Progress Notes (Signed)
Maurice Sanders, Maurice ([id number]) Visit Report for 12/21/2014 Arrival Information Details Patient Name: Maurice Sanders, Maurice Sanders Date of Service: 12/21/2014 9:30 AM Medical Record Number: [id number] Patient Account Number: [id number] Date of Birth/Sex: 09/04/1945 (69 y.o. Male) Treating RN: Huel CoventryWoody, Kim Primary Care Physician: Beverely RisenKHAN, FOZIA Other Clinician: Referring Physician: Beverely RisenKHAN, FOZIA Treating Physician/Extender: Rudene ReBritto, Errol Weeks in Treatment: 4123 Visit Information History Since Last Visit Added or deleted any medications: No Patient Arrived: Ambulatory Any new allergies or adverse reactions: No Arrival Time: 06:51 Had a fall or experienced change in No Accompanied By: self activities of daily living that may affect Transfer Assistance: None risk of falls: Patient Identification Verified: Yes Signs or symptoms of abuse/neglect since last No Secondary Verification Process Yes visito Completed: Hospitalized since last visit: No Patient Requires Transmission- No Has Dressing in Place as Prescribed: Yes Based Precautions: Has Compression in Place as Prescribed: Yes Patient Has Alerts: Yes Pain Present Now: No Patient Alerts: Patient on Blood Thinner 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 Male) Treating RN: Huel CoventryWoody, Kim Primary Care Physician: Beverely RisenKHAN, FOZIA Other Clinician: Referring Physician: Beverely RisenKHAN, FOZIA Treating Physician/Extender: Rudene ReBritto, Errol Weeks in Treatment: 4723 Encounter Discharge Information Items Discharge Pain Level: 0 Discharge Condition:  Stable Ambulatory Status: Ambulatory Discharge Destination: Home Private Transportation: Auto Accompanied By: self Schedule Follow-up Appointment: Yes Medication Reconciliation completed and Yes provided to Patient/Care Aleene Swanner: Clinical Summary of Care: Electronic Signature(s) Signed: 12/21/2014 4:39:41 PM By: Elliot GurneyWoody, RN, BSN, Kim RN, BSN Entered By: Elliot GurneyWoody, RN, BSN, Kim on 12/21/2014 11:16:43 Maurice Sanders, Maurice ([id number]) -------------------------------------------------------------------------------- Patient/Caregiver Education Details Patient Name: Maurice Sanders, Maurice Sanders Date of Service: 12/21/2014 9:30 AM Medical Record Number: [id number] Patient Account Number: [id number] Date of Birth/Gender: 02/10/1946 (68 y.o. Male) Treating RN: Huel CoventryWoody, Kim Primary Care Physician: Beverely RisenKHAN, FOZIA Other Clinician: Referring Physician: Beverely RisenKHAN, FOZIA Treating Physician/Extender: Rudene ReBritto, Errol Weeks in Treatment: 5323 Education Assessment Education Provided To: Patient Education Topics Provided Venous: Controlling Swelling with Compression Stockings , Other: if become to tight elevate legs Handouts: above heart and pump ankles. Methods: Demonstration, Explain/Verbal Responses: State content correctly Electronic Signature(s) Signed: 12/21/2014 4:39:41 PM By: Elliot GurneyWoody, RN, BSN, Kim RN, BSN Entered By: Elliot GurneyWoody, RN, BSN, Kim on 12/21/2014 11:15:50

## 2014-12-25 ENCOUNTER — Encounter: Payer: Medicare Other | Admitting: Surgery

## 2014-12-25 DIAGNOSIS — E11620 Type 2 diabetes mellitus with diabetic dermatitis: Secondary | ICD-10-CM | POA: Diagnosis not present

## 2014-12-25 DIAGNOSIS — I87309 Chronic venous hypertension (idiopathic) without complications of unspecified lower extremity: Secondary | ICD-10-CM | POA: Diagnosis not present

## 2014-12-26 NOTE — Progress Notes (Signed)
Maurice Sanders ([id number]) Visit Report for 12/25/2014 Chief Complaint Document Details Patient Name: Maurice Sanders Date of Service: 12/25/2014 8:00 AM Medical Record Number: [id number] Patient Account Number: [id number] Date of Birth/Sex: Jan 09, 1946 (69 y.o. Male) Treating RN: Primary Care Physician: Beverely Risen Other Clinician: Referring Physician: Beverely Risen Treating Physician/Extender: Rudene Re in Treatment: 24 Information Obtained from: Patient Chief Complaint Maurice Sanders has been coming for dressing changes twice a week, on Monday and Thursday. Over all he is doing fine he has his blood sugar under control and is managing his diuretics well. after changing over from calamine to zinc oxide he said his own Unna's boot still gave him a bit of itching and problems and he had significant swelling which made the Unna's boot fairly tight. Electronic Signature(s) Signed: 12/25/2014 5:41:46 PM By: Evlyn Kanner MD, FACS Entered By: Evlyn Kanner on 12/25/2014 08:58:20 Maurice Sanders ([id number]) -------------------------------------------------------------------------------- Debridement Details Patient Name: Maurice Sanders Date of Service: 12/25/2014 8:00 AM Medical Record Number: [id number] Patient Account Number: [id number] Date of Birth/Sex: [birth year] (68 y.o. Male) Treating RN: Primary Care Physician: Beverely Risen Other Clinician: Referring Physician: Beverely Risen Treating Physician/Extender: Rudene Re in Treatment: 24 Debridement Performed for Wound #2 Right,Proximal,Medial Malleolus Assessment: Performed By: Physician Maurice Sanders., MD Debridement: Debridement Pre-procedure Yes Verification/Time Out Taken: Start Time: 08:35 Pain Control: Lidocaine 4% Topical Solution Level: Skin/Subcutaneous Tissue Total Area Debrided (L x 5 (cm) x 3 (cm) = 15 (cm) W): Tissue and other Viable, Non-Viable, Exudate, Fibrin/Slough, Subcutaneous material  debrided: Instrument: Curette Bleeding: Minimum Hemostasis Achieved: Pressure End Time: 08:38 Procedural Pain: 0 Post Procedural Pain: 0 Response to Treatment: Procedure was tolerated well Post Debridement Measurements of Total Wound Length: (cm) 5 Width: (cm) 4.6 Depth: (cm) 0.2 Volume: (cm) 3.613 Electronic Signature(s) Signed: 12/25/2014 5:41:46 PM By: Evlyn Kanner MD, FACS Entered By: Evlyn Kanner on 12/25/2014 08:57:58 Maurice Sanders ([id number]) -------------------------------------------------------------------------------- HPI Details Patient Name: Maurice Sanders Date of Service: 12/25/2014 8:00 AM Medical Record Number: [id number] Patient Account Number: [id number] Date of Birth/Sex: [birth year] (69 y.o. Male) Treating RN: Primary Care Physician: Beverely Risen Other Clinician: Referring Physician: Beverely Risen Treating Physician/Extender: Rudene Re in Treatment: 24 History of Present Illness Location: right lower extremity Quality: Patient reports experiencing a dull pain to affected area(s). Severity: patient denies any severe pain. Duration: he has had this wound for a few weeks now. Timing: Pain in wound is Intermittent (comes and goes Context: the patient has a long history of venous insufficiency. He has been very compliant with wearing his compression stockings. Modifying Factors: the patient normally wears compression stockings. Recently he had placed a fair amount of Vaseline which cause skin irritation. Associated Signs and Symptoms: Patient denies any fevers. He has had a moderate amount of drainage. HPI Description: The patient is 69 year-old male with a history of diabetes who presents with right lower extremity wound. He first presented to our clinic on 07/10/2014. He does have a history of venous insufficiency in the past. He has been very compliant with wearing his compression stockings. He recently Vaseline on his legs which caused skin  irritation and breakdown. The patient returns for follow-up today. he denies any fevers at home. However due to the fact that he had stopped his diuretic he has had a lot of edema of the lower extremity. He also has a lot of scaly skin on his right lower extremity. 10/02/14 -- he has resumed his diuretics on a regular basis and has been walking around adequately and  has overall improved. No pain or no discharge from the wounds. 10/09/14 -- the patient has been having some pain at night while sleeping and was requesting pain medications. He also says that due to taking his diuretics he's been getting some cramps and I have asked him to please see his PCP regarding both these problems. Also awaiting insurance clearance regarding his Apligraf. addendum: I have now got a clear copy of his vascular workup studies and have reviewed the entire details dated encounter 08/17/2014. the lower extremity venous duplex study done on 08/17/2014 revealed no evidence of deep vein thrombosis or superficial thrombophlebitis bilaterally, no incompetence of the greater small saphenous veins bilaterally, enlarged lymph nodes in the groin bilaterally. As seen by Dr. Gilda CreaseSchnier on the same day. Dr. Gilda CreaseSchnier recommended that he should continue wearing graduated compression stockings of class I which is 20-30 mmHg pressure on a daily basis and a prescription was given. He was to be reassessed in 6 months and the question of getting him lymphedema pumps were also discussed. 10/16/2014. Vascular note reviewed in detail. I have been able to review his notes from Dr. Gilda CreaseSchnier on 08/17/2014. 1.The venous duplex study done on 08/17/2014 showed that all veins visualized showed no evidence of DVT. 2. no incompetence of the greater small saphenous veins bilaterally. Maurice Sanders, Maurice Sanders ([id number]) The plan was that he had no surgery or intervention needed at this point. The long discussion had showed that the patient had chronic skin  changes accompanied by venous insufficiency and at this stage he would benefit from wearing graduated compression stockings of the 20-30 mmHg on a daily basis and review would be done in his office back again in 6 months. At that time the patient could have a lymph pump. except for the new abrasion on the right lower extremity just below his tibial tuberosity he is doing fine and his ulcers actually look much better. 10/23/14 -- Culture report on Maurice BayleyAnthony Sanders --- His wound culture has grown Citrobacter koseri and Enterococcus faecalis. Final report is back but so far sensitive to Bactrim and ampicillin and ciprofloxacin. Bactrim DS was called in for 14 days. 11/13/2014 last week we changed his Unna boot on Monday and Thursday and this has helped him a lot. The edema has been much better the Unna boot hasn't slipped down and he has not had any skin abrasions. 11/20/2014 -- he saw the vascular surgeon Dr. Gilda CreaseSchnier this morning and he was pleased with his progress and has said he would be ordering him some lymphedema pumps. Other than that the patient is coming to change his Unna's boots twice a week and he is doing well with this. 11/27/2014 -- over Saturday and Sunday he started having more swelling in his leg and some pain and I believe his own Unna's boot caused him some discomfort and excoriation of skin. He does admit he took some more juices orally but he has continued his diuretic. 12/11/2014 -- he has completed his course of antibiotics and has had no fresh issues. He has had no pain no swelling and is managing well with his blood sugars and diuretics. 12/14/2014 between Monday and today he has had some dietary indiscretions and had some [place] food which may have made him retain a lot of fluid. His edema has increased markedly and a lot of more excoriation. 12/25/2014 -- This week he has really done well and his edema is under control and these had no pain or problems. His leg is  feeling  good. Electronic Signature(s) Signed: 12/25/2014 5:41:46 PM By: Evlyn Kanner MD, FACS Entered By: Evlyn Kanner on 12/25/2014 08:58:53 Maurice Sanders ([id number]) -------------------------------------------------------------------------------- Physical Exam Details Patient Name: Maurice Sanders Date of Service: 12/25/2014 8:00 AM Medical Record Number: [id number] Patient Account Number: [id number] Date of Birth/Sex: Jul 01, 1946 (68 y.o. Male) Treating RN: Primary Care Physician: Beverely Risen Other Clinician: Referring Physician: Beverely Risen Treating Physician/Extender: Rudene Re in Treatment: 24 Constitutional . Pulse regular. Respirations normal and unlabored. Afebrile. . Eyes Nonicteric. Reactive to light. Ears, Nose, Mouth, and Throat Lips, teeth, and gums WNL.Marland Kitchen Moist mucosa without lesions . Neck supple and nontender. No palpable supraclavicular or cervical adenopathy. Normal sized without goiter. Respiratory WNL. No retractions.. Cardiovascular Pedal Pulses WNL. No clubbing, cyanosis or edema. Integumentary (Hair, Skin) the entire right leg looks much better than done in the last several weeks and his ulceration is smaller with minimal slough.. No crepitus or fluctuance. No peri-wound warmth or erythema. No masses.Marland Kitchen Psychiatric Judgement and insight Intact.. No evidence of depression, anxiety, or agitation.. Electronic Signature(s) Signed: 12/25/2014 5:41:46 PM By: Evlyn Kanner MD, FACS Entered By: Evlyn Kanner on 12/25/2014 08:59:59 Maurice Sanders ([id number]) -------------------------------------------------------------------------------- Physician Orders Details Patient Name: Maurice Sanders Date of Service: 12/25/2014 8:00 AM Medical Record Number: [id number] Patient Account Number: [id number] Date of Birth/Sex: 19-Feb-1946 (68 y.o. Male) Treating RN: Curtis Sites Primary Care Physician: Beverely Risen Other Clinician: Referring Physician: Beverely Risen Treating Physician/Extender: Rudene Re in Treatment: 33 Verbal / Phone Orders: Yes Clinician: Curtis Sites Read Back and Verified: Yes Diagnosis Coding Wound Cleansing Wound #2 Right,Proximal,Medial Malleolus o May shower with protection. Anesthetic Wound #2 Right,Proximal,Medial Malleolus o Topical Lidocaine 4% cream applied to wound bed prior to debridement Skin Barriers/Peri-Wound Care Wound #2 Right,Proximal,Medial Malleolus o Barrier cream o Moisturizing lotion Primary Wound Dressing Wound #2 Right,Proximal,Medial Malleolus o Aquacel Ag Secondary Dressing Wound #2 Right,Proximal,Medial Malleolus o Contact Layer o Drawtex Dressing Change Frequency Wound #2 Right,Proximal,Medial Malleolus o Dressing is to be changed Monday and Thursday. Follow-up Appointments Wound #2 Right,Proximal,Medial Malleolus o Return Appointment in 1 week. o Nurse Visit as needed - Thursday for dressing change Edema Control Wound #2 Right,Proximal,Medial Malleolus o 4-Layer Compression System - Right Lower Extremity Maurice Sanders, Maurice Sanders ([id number]) Electronic Signature(s) Signed: 12/25/2014 5:41:46 PM By: Evlyn Kanner MD, FACS Signed: 12/26/2014 3:12:27 PM By: Curtis Sites Entered By: Curtis Sites on 12/25/2014 08:38:58 Maurice Sanders ([id number]) -------------------------------------------------------------------------------- Problem List Details Patient Name: Maurice Sanders Date of Service: 12/25/2014 8:00 AM Medical Record Number: [id number] Patient Account Number: [id number] Date of Birth/Sex: 1945/08/31 (68 y.o. 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 Male) Treating RN: Primary Care Physician: Beverely Risen Other Clinician: Referring Physician: Beverely Risen Treating Physician/Extender: Rudene Re in Treatment: 24 Subjective Chief Complaint Information obtained from Patient [place] has been coming for dressing changes twice a week, on Monday and Thursday. Over all he is doing fine he has his blood  sugar under control and is managing his diuretics well. after changing over from calamine to zinc oxide he said his own Unna's boot still gave him a bit of itching and problems and he had significant swelling which made the Unna's boot fairly tight. History of Present Illness (HPI) The following HPI elements were documented for the patient's wound: Location: right lower extremity Quality: Patient reports experiencing a dull pain to affected area(s). Severity: patient denies any severe pain. Duration: he has had this wound for a few weeks now. Timing: Pain in wound is Intermittent (comes and goes Context: the patient has a long history of venous insufficiency. He has been very compliant with wearing his compression stockings. Modifying Factors: the patient normally wears compression stockings. Recently he had placed a fair amount of Vaseline which cause skin irritation. Associated Signs and Symptoms: Patient denies any fevers. He has had a moderate amount of drainage. The patient is 69  year-old male with a history of diabetes who presents with right lower extremity wound. He first presented to our clinic on 07/10/2014. He does have a history of venous insufficiency in the past. He has been very compliant with wearing his compression stockings. He recently Vaseline on his legs which caused skin irritation and breakdown. The patient returns for follow-up today. he denies any fevers at home. However due to the fact that he had stopped his diuretic he has had a lot of edema of the lower extremity. He also has a lot of scaly skin on his right lower extremity. 10/02/14 -- he has resumed his diuretics on a regular basis and has been walking around adequately and has overall improved. No pain or no discharge from the wounds. 10/09/14 -- the patient has been having some pain at night while sleeping and was requesting pain medications. He also says that due to taking his diuretics he's been getting some cramps and I have asked him to please see his PCP regarding both these problems. Also awaiting insurance clearance regarding his Apligraf. addendum: I have now got a clear copy of his vascular workup studies and have reviewed the entire details dated encounter 08/17/2014. the lower extremity venous duplex study done on 08/17/2014 revealed no evidence of deep vein thrombosis or superficial thrombophlebitis bilaterally, no incompetence of the greater small saphenous veins bilaterally, enlarged lymph nodes in the groin bilaterally. Maurice Sanders, Maurice Sanders ([id number]) As seen by Dr. Gilda Crease on the same day. Dr. Gilda Crease recommended that he should continue wearing graduated compression stockings of class I which is 20-30 mmHg pressure on a daily basis and a prescription was given. He was to be reassessed in 6 months and the question of getting him lymphedema pumps were also discussed. 10/16/2014. Vascular note reviewed in detail. I have been able to review his notes from Dr. Gilda Crease on  08/17/2014. 1.The venous duplex study done on 08/17/2014 showed that all veins visualized showed no evidence of DVT. 2. no incompetence of the greater small saphenous veins bilaterally. The plan was that he had no surgery or intervention needed at this point. The long discussion had showed that the patient had chronic skin changes accompanied by venous insufficiency and at this stage he would benefit from wearing graduated compression stockings of the 20-30 mmHg on a daily basis and review would be done in his office back again in 6 months. At that time the patient could have a lymph pump. except for the new abrasion on the right lower extremity just below his tibial tuberosity he is doing  fine and his ulcers actually look much better. 10/23/14 -- Culture report on Maurice Sanders --- His wound culture has grown Citrobacter koseri and Enterococcus faecalis. Final report is back but so far sensitive to Bactrim and ampicillin and ciprofloxacin. Bactrim DS was called in for 14 days. 11/13/2014 last week we changed his Unna boot on Monday and Thursday and this has helped him a lot. The edema has been much better the Unna boot hasn't slipped down and he has not had any skin abrasions. 11/20/2014 -- he saw the vascular surgeon Dr. Gilda Crease this morning and he was pleased with his progress and has said he would be ordering him some lymphedema pumps. Other than that the patient is coming to change his Unna's boots twice a week and he is doing well with this. 11/27/2014 -- over Saturday and Sunday he started having more swelling in his leg and some pain and I believe his own Unna's boot caused him some discomfort and excoriation of skin. He does admit he took some more juices orally but he has continued his diuretic. 12/11/2014 -- he has completed his course of antibiotics and has had no fresh issues. He has had no pain no swelling and is managing well with his blood sugars and diuretics. 12/14/2014  between Monday and today he has had some dietary indiscretions and had some [place] food which may have made him retain a lot of fluid. His edema has increased markedly and a lot of more excoriation. 12/25/2014 -- This week he has really done well and his edema is under control and these had no pain or problems. His leg is feeling good. Maurice Sanders, Maurice Sanders ([id number]) Objective Constitutional Pulse regular. Respirations normal and unlabored. Afebrile. Vitals Time Taken: 8:05 AM, Height: 71 in, Weight: 291.7 lbs, BMI: 40.7, Temperature: 97.9 F, Pulse: 63 bpm, Respiratory Rate: 18 breaths/min, Blood Pressure: 168/65 mmHg. Eyes Nonicteric. Reactive to light. Ears, Nose, Mouth, and Throat Lips, teeth, and gums WNL.Marland Kitchen Moist mucosa without lesions . Neck supple and nontender. No palpable supraclavicular or cervical adenopathy. Normal sized without goiter. Respiratory WNL. No retractions.. Cardiovascular Pedal Pulses WNL. No clubbing, cyanosis or edema. Psychiatric Judgement and insight Intact.. No evidence of depression, anxiety, or agitation.. Integumentary (Hair, Skin) the entire right leg looks much better than done in the last several weeks and his ulceration is smaller with minimal slough.. No crepitus or fluctuance. No peri-wound warmth or erythema. No masses.. Wound #2 status is Open. Original cause of wound was Gradually Appeared. The wound is located on the Right,Proximal,Medial Malleolus. The wound measures 5cm length x 4.6cm width x 0.2cm depth; 18.064cm^2 area and 3.613cm^3 volume. The wound is limited to skin breakdown. There is no tunneling or undermining noted. There is a medium amount of serosanguineous drainage noted. The wound margin is distinct with the outline attached to the wound base. There is medium (34-66%) red, pink granulation within the wound bed. There is a medium (34-66%) amount of necrotic tissue within the wound bed including Adherent Slough. The periwound skin  appearance exhibited: Scarring, Maceration, Moist, Hemosiderin Staining. The periwound skin appearance did not exhibit: Callus, Crepitus, Excoriation, Fluctuance, Friable, Induration, Localized Edema, Rash, Dry/Scaly, Atrophie Blanche, Cyanosis, Ecchymosis, Mottled, Pallor, Rubor, Erythema. Periwound temperature was noted as No Abnormality. The periwound has tenderness on palpation. Maurice Sanders, Maurice Sanders ([id number]) The entire right leg looks much better than done in the last several weeks and his ulceration is smaller with minimal slough. 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 I'm going to recommend we use Acticoat 7 and a 4-layer compression wrap and if he tolerates this through the week we will see him back next Monday. If he has any problems during the week he should call and we'llat this coming Thursday. Procedures Wound #2 Wound #2 is a Venous Leg Ulcer located on the Right,Proximal,Medial Malleolus . There was a Skin/Subcutaneous Tissue Debridement ([id number]) debridement with total area of 15 sq cm performed by Brandis Matsuura, Ignacia Felling., MD. with the following instrument(s): Curette to remove Viable and Non-Viable tissue/material including Exudate, Fibrin/Slough, and Subcutaneous after achieving pain control using Lidocaine 4% Topical Solution. A time out was conducted prior to the start of the procedure. A Minimum amount of bleeding was controlled with Pressure. The procedure was tolerated well with a pain level of 0 throughout and a pain level of 0 following the procedure. Post Debridement Measurements: 5cm length x 4.6cm width x 0.2cm depth; 3.613cm^3 volume. Plan Wound Cleansing: Wound #2 Right,Proximal,Medial Malleolus: May shower with protection. Anesthetic: Wound #2 Right,Proximal,Medial  Malleolus: Topical Lidocaine 4% cream applied to wound bed prior to debridement Skin Barriers/Peri-Wound Care: Wound #2 Right,Proximal,Medial Malleolus: Maurice Sanders, Maurice Sanders ([id number]) Barrier cream Moisturizing lotion Primary Wound Dressing: Wound #2 Right,Proximal,Medial Malleolus: Aquacel Ag Secondary Dressing: Wound #2 Right,Proximal,Medial Malleolus: Contact Layer Drawtex Dressing Change Frequency: Wound #2 Right,Proximal,Medial Malleolus: Dressing is to be changed Monday and Thursday. Follow-up Appointments: Wound #2 Right,Proximal,Medial Malleolus: Return Appointment in 1 week. Nurse Visit as needed - Thursday for dressing change Edema Control: Wound #2 Right,Proximal,Medial Malleolus: 4-Layer Compression System - Right Lower Extremity Overall he is doing much better than he has done before. I'm going to recommend we use Acticoat 7 and a 4-layer compression wrap and if he tolerates this through the week we will see him back next Monday. If he has any problems during the week he should call and we'llat this coming Thursday. Electronic Signature(s) Signed: 12/25/2014 5:41:46 PM By: Evlyn Kanner MD, FACS Entered By: Evlyn Kanner on 12/25/2014 15:16:16 Maurice Sanders ([id number]) -------------------------------------------------------------------------------- SuperBill Details Patient Name: Maurice Sanders Date of Service: 12/25/2014 Medical Record Number: [id number] Patient Account Number: [id number] Date of Birth/Sex: February 08, 1946 (69 y.o. 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

## 2014-12-26 NOTE — Progress Notes (Signed)
JADA, KUHNERT ([id number]) Visit Report for 12/25/2014 Arrival Information Details Patient Name: Maurice Sanders, Maurice Sanders Date of Service: 12/25/2014 8:00 AM Medical Record Number: [id number] Patient Account Number: [id number] Date of Birth/Sex: 1946/01/17 (69 y.o. Male) Treating RN: Huel Coventry Primary Care Physician: Beverely Risen Other Clinician: Referring Physician: Beverely Risen Treating Physician/Extender: Rudene Re in Treatment: 24 Visit Information History Since Last Visit Added or deleted any medications: No Patient Arrived: Ambulatory Any new allergies or adverse reactions: No Arrival Time: 08:10 Had a fall or experienced change in No Accompanied By: self activities of daily living that may affect Transfer Assistance: None risk of falls: Patient Identification Verified: Yes Signs or symptoms of abuse/neglect since last No Secondary Verification Process Yes visito Completed: Hospitalized since last visit: No Patient Requires Transmission- No Has Dressing in Place as Prescribed: Yes Based Precautions: Has Compression in Place as Prescribed: Yes Patient Has Alerts: Yes Pain Present Now: No Patient Alerts: Patient on Blood Thinner DM II ABI L 1.06; R 1.13 Electronic Signature(s) Signed: 12/25/2014 5:35:22 PM By: Elliot Gurney, RN, BSN, Kim RN, BSN Entered By: Elliot Gurney, RN, BSN, Kim on 12/25/2014 08:11:10 Maurice Sanders ([id number]) -------------------------------------------------------------------------------- Encounter Discharge Information Details Patient Name: Maurice Sanders Date of Service: 12/25/2014 8:00 AM Medical Record Number: [id number] Patient Account Number: [id number] Date of Birth/Sex: Feb 24, 1946 (68 y.o. Male) Treating RN: Primary Care Physician: Beverely Risen Other Clinician: Referring Physician: Beverely Risen Treating Physician/Extender: Rudene Re in Treatment: 24 Encounter Discharge Information Items Schedule Follow-up Appointment: No Medication  Reconciliation completed and No provided to Patient/Care Harmonii Karle: Patient Clinical Summary of Care: Declined Electronic Signature(s) Signed: 12/25/2014 8:52:46 AM By: Gwenlyn Perking Entered By: Gwenlyn Perking on 12/25/2014 08:52:46 Maurice Sanders ([id number]) -------------------------------------------------------------------------------- Lower Extremity Assessment Details Patient Name: Maurice Sanders Date of Service: 12/25/2014 8:00 AM Medical Record Number: [id number] Patient Account Number: [id number] Date of Birth/Sex: 1946-05-19 (68 y.o. Male) Treating RN: Huel Coventry Primary Care Physician: Beverely Risen Other Clinician: Referring Physician: Beverely Risen Treating Physician/Extender: Rudene Re in Treatment: 24 Edema Assessment Assessed: [Left: No] [Right: No] E[Left: dema] [Right: :] Calf Left: Right: Point of Measurement: 33 cm From Medial Instep cm 36 cm Ankle Left: Right: Point of Measurement: 11 cm From Medial Instep cm 26.3 cm Vascular Assessment Claudication: Claudication Assessment [Right:None] Pulses: Posterior Tibial Dorsalis Pedis Palpable: [Right:Yes] Extremity colors, hair growth, and conditions: Extremity Color: [Right:Hyperpigmented] Hair Growth on Extremity: [Right:No] Temperature of Extremity: [Right:Warm] Capillary Refill: [Right:< 3 seconds] Toe Nail Assessment Left: Right: Thick: Yes Discolored: Yes Deformed: No Improper Length and Hygiene: No Electronic Signature(s) Signed: 12/25/2014 5:35:22 PM By: Elliot Gurney, RN, BSN, Kim RN, BSN Entered By: Elliot Gurney, RN, BSN, Kim on 12/25/2014 08:18:43 Maurice Sanders ([id number]) Maurice Sanders ([id number]) -------------------------------------------------------------------------------- Multi Wound Chart Details Patient Name: Maurice Sanders Date of Service: 12/25/2014 8:00 AM Medical Record Number: [id number] Patient Account Number: [id number] Date of Birth/Sex: Mar 02, 1946 (68 y.o. Male) Treating RN: Curtis Sites Primary Care Physician: Beverely Risen Other Clinician: Referring Physician: Beverely Risen Treating Physician/Extender: Rudene Re in Treatment: 24 Vital Signs Height(in): 71 Pulse(bpm): 63 Weight(lbs): 291.7 Blood Pressure 168/65 (mmHg): Body Mass Index(BMI): 41 Temperature(F): 97.9 Respiratory Rate 18 (breaths/min): Photos: [2:No Photos] [N/A:N/A] Wound Location: [2:Right Malleolus - Medial, Proximal] [N/A:N/A] Wounding Event: [2:Gradually Appeared] [N/A:N/A] Primary Etiology: [2:Venous Leg Ulcer] [N/A:N/A] Comorbid History: [2:Sleep Apnea, Congestive Heart Failure, Deep Vein Thrombosis, Hypertension, Type II Diabetes, Gout] [N/A:N/A] Date Acquired: [2:07/31/2014] [N/A:N/A] Weeks of Treatment: [2:21] [N/A:N/A] Wound Status: [2:Open] [N/A:N/A] Measurements L x W x D 5x4.6x0.2 [N/A:N/A] (cm) Area (  cm) : [2:18.064] [N/A:N/A] Volume (cm) : [2:3.613] [N/A:N/A] % Reduction in Area: [2:-2455.00%] [N/A:N/A] % Reduction in Volume: -4988.70% [N/A:N/A] Classification: [2:Full Thickness Without Exposed Support Structures] [N/A:N/A] HBO Classification: [2:Grade 1] [N/A:N/A] Exudate Amount: [2:Medium] [N/A:N/A] Exudate Type: [2:Serosanguineous] [N/A:N/A] Exudate Color: [2:red, brown] [N/A:N/A] Wound Margin: [2:Distinct, outline attached] [N/A:N/A] Granulation Amount: [2:Medium (34-66%)] [N/A:N/A] Granulation Quality: [2:Red, Pink] [N/A:N/A] Necrotic Amount: [2:Medium (34-66%)] [N/A:N/A] Exposed Structures: Fascia: No N/A N/A Fat: No Tendon: No Muscle: No Joint: No Bone: No Limited to Skin Breakdown Epithelialization: Medium (34-66%) N/A N/A Periwound Skin Texture: Scarring: Yes N/A N/A Edema: No Excoriation: No Induration: No Callus: No Crepitus: No Fluctuance: No Friable: No Rash: No Periwound Skin Maceration: Yes N/A N/A Moisture: Moist: Yes Dry/Scaly: No Periwound Skin Color: Hemosiderin Staining: Yes N/A N/A Atrophie Blanche: No Cyanosis:  No Ecchymosis: No Erythema: No Mottled: No Pallor: No Rubor: No Temperature: No Abnormality N/A N/A Tenderness on Yes N/A N/A Palpation: Wound Preparation: Ulcer Cleansing: N/A N/A Rinsed/Irrigated with Saline, Other: water and soap Topical Anesthetic Applied: Other: lidocaine 4% Treatment Notes Electronic Signature(s) Signed: 12/26/2014 3:12:27 PM By: Curtis Sites Entered By: Curtis Sites on 12/25/2014 08:36:11 Maurice Sanders ([id number]) -------------------------------------------------------------------------------- Multi-Disciplinary Care Plan Details Patient Name: Maurice Sanders Date of Service: 12/25/2014 8:00 AM Medical Record Number: [id number] Patient Account Number: [id number] Date of Birth/Sex: [birth year] (68 y.o. Male) Treating RN: Curtis Sites Primary Care Physician: Beverely Risen Other Clinician: Referring Physician: Beverely Risen Treating Physician/Extender: Rudene Re in Treatment: 18 Active Inactive Abuse / Safety / Falls / Self Care Management Nursing Diagnoses: Potential for falls Goals: Patient will remain injury free Date Initiated: 07/10/2014 Goal Status: Active Interventions: Assess fall risk on admission and as needed Notes: Nutrition Nursing Diagnoses: Potential for alteratiion in Nutrition/Potential for imbalanced nutrition Goals: Patient/caregiver agrees to and verbalizes understanding of need to use nutritional supplements and/or vitamins as prescribed Date Initiated: 07/10/2014 Goal Status: Active Interventions: Assess patient nutrition upon admission and as needed per policy Notes: Orientation to the Wound Care Program Nursing Diagnoses: Knowledge deficit related to the wound healing center program Goals: Patient/caregiver will verbalize understanding of the Wound Healing Center Program [place], [place] ([id number]) Date Initiated: 07/10/2014 Goal Status: Active Interventions: Provide education on orientation to the wound  center Notes: Wound/Skin Impairment Nursing Diagnoses: Impaired tissue integrity Goals: Ulcer/skin breakdown will have a volume reduction of 30% by week 4 Date Initiated: 07/10/2014 Goal Status: Active Interventions: Assess ulceration(s) every visit Notes: Electronic Signature(s) Signed: 12/26/2014 3:12:27 PM By: Curtis Sites Entered By: Curtis Sites on 12/25/2014 08:35:59 Maurice Sanders ([id number]) -------------------------------------------------------------------------------- Pain Assessment Details Patient Name: Maurice Sanders Date of Service: 12/25/2014 8:00 AM Medical Record Number: [id number] Patient Account Number: [id number] Date of Birth/Sex: 08-26-1945 (69 y.o. Male) Treating RN: Huel Coventry Primary Care Physician: Beverely Risen Other Clinician: Referring Physician: Beverely Risen Treating Physician/Extender: Rudene Re in Treatment: 24 Active Problems Location of Pain Severity and Description of Pain Patient Has Paino No Site Locations Pain Management and Medication Current Pain Management: Electronic Signature(s) Signed: 12/25/2014 5:35:22 PM By: Elliot Gurney, RN, BSN, Kim RN, BSN Entered By: Elliot Gurney, RN, BSN, Kim on 12/25/2014 08:11:16 Maurice Sanders ([id number]) -------------------------------------------------------------------------------- Patient/Caregiver Education Details Patient Name: Maurice Sanders Date of Service: 12/25/2014 8:00 AM Medical Record Number: [id number] Patient Account Number: [id number] Date of Birth/Gender: 01-12-1946 (68 y.o. Male) Treating RN: Huel Coventry Primary Care Physician: Beverely Risen Other Clinician: Referring Physician: Beverely Risen Treating Physician/Extender: Rudene Re in Treatment: 93 Education Assessment Education Provided To: Patient Education Topics Provided Venous:  Handouts: Controlling Swelling with Compression Stockings , Other: Eleveate legs above heart when sitting Electronic Signature(s) Signed: 12/25/2014  5:35:22 PM By: Elliot GurneyWoody, RN, BSN, Kim RN, BSN Entered By: Elliot GurneyWoody, RN, BSN, Kim on 12/25/2014 08:58:02 Maurice BayleySILER, Dereke ([id number]) -------------------------------------------------------------------------------- Wound Assessment Details Patient Name: Maurice BayleySILER, Williamson Date of Service: 12/25/2014 8:00 AM Medical Record Number: [id number] Patient Account Number: [id number] Date of Birth/Sex: 07/31/1946 (68 y.o. Male) Treating RN: Huel CoventryWoody, Kim Primary Care Physician: Beverely RisenKHAN, FOZIA Other Clinician: Referring Physician: Beverely RisenKHAN, FOZIA Treating Physician/Extender: Rudene ReBritto, Errol Weeks in Treatment: 24 Wound Status Wound Number: 2 Primary Venous Leg Ulcer Etiology: Wound Location: Right Malleolus - Medial, Proximal Wound Open Status: Wounding Event: Gradually Appeared Comorbid Sleep Apnea, Congestive Heart Failure, Date Acquired: 07/31/2014 History: Deep Vein Thrombosis, Hypertension, Weeks Of Treatment: 21 Type II Diabetes, Gout Clustered Wound: No Photos Photo Uploaded By: Elliot GurneyWoody, RN, BSN, Kim on 12/25/2014 10:58:57 Wound Measurements Length: (cm) 5 Width: (cm) 4.6 Depth: (cm) 0.2 Area: (cm) 18.064 Volume: (cm) 3.613 % Reduction in Area: -2455% % Reduction in Volume: -4988.7% Epithelialization: Medium (34-66%) Tunneling: No Undermining: No Wound Description Full Thickness Without Exposed Foul Odor A Classification: Support Structures Diabetic Severity Grade 1 (Wagner): Wound Margin: Distinct, outline attached Exudate Amount: Medium Exudate Type: Serosanguineous Exudate Color: red, brown fter Cleansing: No Wound Bed Granulation Amount: Medium (34-66%) Exposed Structure Yust, Wladyslaw ([id number]) Granulation Quality: Red, Pink Fascia Exposed: No Necrotic Amount: Medium (34-66%) Fat Layer Exposed: No Necrotic Quality: Adherent Slough Tendon Exposed: No Muscle Exposed: No Joint Exposed: No Bone Exposed: No Limited to Skin Breakdown Periwound Skin Texture Texture Color No  Abnormalities Noted: No No Abnormalities Noted: No Callus: No Atrophie Blanche: No Crepitus: No Cyanosis: No Excoriation: No Ecchymosis: No Fluctuance: No Erythema: No Friable: No Hemosiderin Staining: Yes Induration: No Mottled: No Localized Edema: No Pallor: No Rash: No Rubor: No Scarring: Yes Temperature / Pain Moisture Temperature: No Abnormality No Abnormalities Noted: No Tenderness on Palpation: Yes Dry / Scaly: No Maceration: Yes Moist: Yes Wound Preparation Ulcer Cleansing: Rinsed/Irrigated with Saline, Other: water and soap, Topical Anesthetic Applied: Other: lidocaine 4%, Treatment Notes Wound #2 (Right, Proximal, Medial Malleolus) 1. Cleansed with: Clean wound with Normal Saline 2. Anesthetic Topical Lidocaine 4% cream to wound bed prior to debridement 3. Peri-wound Care: Barrier cream Moisturizing lotion 4. Dressing Applied: Acticoat 7 Contact layer 7. Secured with 4-Layer Compression System - Right Lower Extremity Notes drawtex applied over acticoat 7. Contact layer applied over draining areas of leg. Maurice BayleySILER, Lashawn ([id number]) Electronic Signature(s) Signed: 12/25/2014 5:35:22 PM By: Elliot GurneyWoody, RN, BSN, Kim RN, BSN Entered By: Elliot GurneyWoody, RN, BSN, Kim on 12/25/2014 08:25:09 Maurice BayleySILER, Xadrian ([id number]) -------------------------------------------------------------------------------- Vitals Details Patient Name: Maurice BayleySILER, Arnoldo Date of Service: 12/25/2014 8:00 AM Medical Record Number: [id number] Patient Account Number: [id number] Date of Birth/Sex: 12/19/1945 (68 y.o. Male) Treating RN: Huel CoventryWoody, Kim Primary Care Physician: Beverely RisenKHAN, FOZIA Other Clinician: Referring Physician: Beverely RisenKHAN, FOZIA Treating Physician/Extender: Rudene ReBritto, Errol Weeks in Treatment: 24 Vital Signs Time Taken: 08:05 Temperature (F): 97.9 Height (in): 71 Pulse (bpm): 63 Weight (lbs): 291.7 Respiratory Rate (breaths/min): 18 Body Mass Index (BMI): 40.7 Blood Pressure (mmHg):  168/65 Reference Range: 80 - 120 mg / dl Electronic Signature(s) Signed: 12/25/2014 5:35:22 PM By: Elliot GurneyWoody, RN, BSN, Kim RN, BSN Entered By: Elliot GurneyWoody, RN, BSN, Kim on 12/25/2014 08:16:00

## 2014-12-28 ENCOUNTER — Ambulatory Visit: Payer: Medicaid Other

## 2014-12-29 ENCOUNTER — Encounter: Payer: Medicare Other | Admitting: Surgery

## 2014-12-29 DIAGNOSIS — I87309 Chronic venous hypertension (idiopathic) without complications of unspecified lower extremity: Secondary | ICD-10-CM | POA: Diagnosis not present

## 2014-12-29 DIAGNOSIS — E11620 Type 2 diabetes mellitus with diabetic dermatitis: Secondary | ICD-10-CM | POA: Diagnosis not present

## 2014-12-29 NOTE — Progress Notes (Signed)
Maurice Sanders, [name] ([id number]) Visit Report for 12/29/2014 Arrival Information Details Patient Name: Maurice Sanders, Octave Date of Service: 12/29/2014 10:15 AM Medical Record Number: [id number] Patient Account Number: [id number] Date of Birth/Sex: 11/12/1945 (69 y.o. Male) Treating RN: Curtis Sitesorthy, Joanna Primary Care Physician: Beverely RisenKHAN, FOZIA Other Clinician: Referring Physician: Beverely RisenKHAN, FOZIA Treating Physician/Extender: BURNS, Regis BillWALTER Weeks in Treatment: 24 Visit Information History Since Last Visit Added or deleted any medications: No Patient Arrived: Ambulatory Any new allergies or adverse reactions: No Arrival Time: 10:20 Had a fall or experienced change in No Accompanied By: self activities of daily living that may affect Transfer Assistance: None risk of falls: Patient Identification Verified: Yes Signs or symptoms of abuse/neglect since last No Secondary Verification Process Yes visito Completed: Hospitalized since last visit: No Patient Requires Transmission- No Pain Present Now: No Based Precautions: Patient Has Alerts: Yes Patient Alerts: Patient on Blood Thinner DM II ABI L 1.06; R 1.13 Electronic Signature(s) Signed: 12/29/2014 3:13:04 PM By: Curtis Sitesorthy, Joanna Entered By: Curtis Sitesorthy, Joanna on 12/29/2014 15:13:04 Maurice Sanders, Maurice Sanders ([id number]) -------------------------------------------------------------------------------- Encounter Discharge Information Details Patient Name: Maurice Sanders, Maurice Sanders Date of Service: 12/29/2014 10:15 AM Medical Record Number: [id number] Patient Account Number: [id number] Date of Birth/Sex: 05/07/1946 [age] y.o. Male) Treating RN: Curtis Sitesorthy, Joanna Primary Care Physician: Beverely RisenKHAN, FOZIA Other Clinician: Referring Physician: Beverely RisenKHAN, FOZIA Treating Physician/Extender: BURNS, Regis BillWALTER Weeks in Treatment: 7124 Encounter Discharge Information Items Discharge Pain Level: 0 Discharge Condition: Stable Ambulatory Status: Ambulatory Discharge Destination:  Home Private Transportation: Auto Accompanied By: self Schedule Follow-up Appointment: Yes Medication Reconciliation completed and No provided to Patient/Care Xela Oregel: Clinical Summary of Care: Electronic Signature(s) Signed: 12/29/2014 3:14:06 PM By: Curtis Sitesorthy, Joanna Entered By: Curtis Sitesorthy, Joanna on 12/29/2014 15:14:06 Maurice Sanders, Sachit ([id number]) -------------------------------------------------------------------------------- Patient/Caregiver Education Details Patient Name: Maurice Sanders, Maurice Sanders Date of Service: 12/29/2014 10:15 AM Medical Record Number: [id number] Patient Account Number: [id number] Date of Birth/Gender: 11/04/1945 (68 y.o. Male) Treating RN: Curtis Sitesorthy, Joanna Primary Care Physician: Beverely RisenKHAN, FOZIA Other Clinician: Referring Physician: Beverely RisenKHAN, FOZIA Treating Physician/Extender: BURNS, Regis BillWALTER Weeks in Treatment: 24 Education Assessment Education Provided To: Patient Education Topics Provided Nutrition: Handouts: Other: protein to help with healing Methods: Explain/Verbal Responses: State content correctly Electronic Signature(s) Signed: 12/29/2014 3:13:51 PM By: Curtis Sitesorthy, Joanna Entered By: Curtis Sitesorthy, Joanna on 12/29/2014 15:13:51

## 2015-01-01 DIAGNOSIS — G4733 Obstructive sleep apnea (adult) (pediatric): Secondary | ICD-10-CM | POA: Diagnosis not present

## 2015-01-04 ENCOUNTER — Encounter: Payer: Medicare Other | Attending: Surgery | Admitting: Surgery

## 2015-01-04 DIAGNOSIS — L97319 Non-pressure chronic ulcer of right ankle with unspecified severity: Secondary | ICD-10-CM | POA: Diagnosis not present

## 2015-01-04 DIAGNOSIS — E11620 Type 2 diabetes mellitus with diabetic dermatitis: Secondary | ICD-10-CM | POA: Insufficient documentation

## 2015-01-04 DIAGNOSIS — I83013 Varicose veins of right lower extremity with ulcer of ankle: Secondary | ICD-10-CM | POA: Insufficient documentation

## 2015-01-04 DIAGNOSIS — I87311 Chronic venous hypertension (idiopathic) with ulcer of right lower extremity: Secondary | ICD-10-CM | POA: Insufficient documentation

## 2015-01-05 NOTE — Progress Notes (Signed)
Maurice Sanders ([id number]) Visit Report for 01/04/2015 Arrival Information Details Patient Name: Maurice Sanders Date of Service: 01/04/2015 3:15 PM Medical Record Number: [id number] Patient Account Number: [id number] Date of Birth/Sex: 08-29-1945 (69 y.o. Male) Treating RN: Afful, RN, BSN, [place] Sink Primary Care Physician: Beverely Risen Other Clinician: Referring Physician: Beverely Risen Treating Physician/Extender: Rudene Re in Treatment: 25 Visit Information History Since Last Visit Any new allergies or adverse reactions: No Patient Arrived: Ambulatory Had a fall or experienced change in No Arrival Time: 15:26 activities of daily living that may affect Accompanied By: self risk of falls: Transfer Assistance: None Signs or symptoms of abuse/neglect since last No Patient Identification Verified: Yes visito Secondary Verification Process Yes Hospitalized since last visit: No Completed: Has Dressing in Place as Prescribed: Yes Patient Requires Transmission- No Has Compression in Place as Prescribed: Yes Based Precautions: Pain Present Now: No Patient Has Alerts: Yes Patient Alerts: Patient on Blood Thinner DM II ABI L 1.06; R 1.13 Electronic Signature(s) Signed: 01/04/2015 4:46:46 PM By: Elpidio Eric BSN, RN Entered By: Elpidio Eric on 01/04/2015 15:26:40 Maurice Sanders ([id number]) -------------------------------------------------------------------------------- Encounter Discharge Information Details Patient Name: Maurice Sanders Date of Service: 01/04/2015 3:15 PM Medical Record Number: [id number] Patient Account Number: [id number] Date of Birth/Sex: [birth year] (69 y.o. Male) Treating RN: Afful, RN, BSN, [place] Sink Primary Care Physician: Beverely Risen Other Clinician: Referring Physician: Beverely Risen Treating Physician/Extender: Rudene Re in Treatment: 25 Encounter Discharge Information Items Discharge Pain Level: 0 Discharge Condition: Stable Ambulatory Status:  Ambulatory Discharge Destination: Home Transportation: Private Auto Accompanied By: dtr Schedule Follow-up Appointment: No Medication Reconciliation completed and provided to Patient/Care No Trigg Delarocha: Provided on Clinical Summary of Care: 01/04/2015 Form Type Recipient Paper Patient AS Electronic Signature(s) Signed: 01/04/2015 4:46:46 PM By: Elpidio Eric BSN, RN Previous Signature: 01/04/2015 3:59:12 PM Version By: Gwenlyn Perking Entered By: Elpidio Eric on 01/04/2015 16:34:55 Maurice Sanders ([id number]) -------------------------------------------------------------------------------- Lower Extremity Assessment Details Patient Name: Maurice Sanders Date of Service: 01/04/2015 3:15 PM Medical Record Number: [id number] Patient Account Number: [id number] Date of Birth/Sex: 10-02-1945 (68 y.o. Male) Treating RN: Afful, RN, BSN, [place] Sink Primary Care Physician: Beverely Risen Other Clinician: Referring Physician: Beverely Risen Treating Physician/Extender: Rudene Re in Treatment: 25 Edema Assessment Assessed: [Left: No] [Right: No] E[Left: dema] [Right: :] Calf Left: Right: Point of Measurement: 33 cm From Medial Instep cm 36.2 cm Ankle Left: Right: Point of Measurement: 11 cm From Medial Instep cm 26.2 cm Vascular Assessment Claudication: Claudication Assessment [Right:None] Pulses: Posterior Tibial Dorsalis Pedis Palpable: [Right:Yes] Extremity colors, hair growth, and conditions: Extremity Color: [Right:Hyperpigmented] Hair Growth on Extremity: [Right:No] Temperature of Extremity: [Right:Warm] Capillary Refill: [Right:< 3 seconds] Dependent Rubor: [Right:No] Blanched when Elevated: [Right:No] Lipodermatosclerosis: [Right:No] Toe Nail Assessment Left: Right: Thick: Yes Discolored: Yes Deformed: Yes Improper Length and Hygiene: Yes Maurice Sanders, Maurice Sanders ([id number]) Electronic Signature(s) Signed: 01/04/2015 4:46:46 PM By: Elpidio Eric BSN, RN Entered By: Elpidio Eric on 01/04/2015  15:31:32 Maurice Sanders ([id number]) -------------------------------------------------------------------------------- Multi Wound Chart Details Patient Name: Maurice Sanders Date of Service: 01/04/2015 3:15 PM Medical Record Number: [id number] Patient Account Number: [id number] Date of Birth/Sex: August 21, 1945 (69 y.o. Male) Treating RN: Curtis Sites Primary Care Physician: Beverely Risen Other Clinician: Referring Physician: Beverely Risen Treating Physician/Extender: Rudene Re in Treatment: 25 Vital Signs Height(in): 71 Pulse(bpm): 66 Weight(lbs): 291.7 Blood Pressure 176/83 (mmHg): Body Mass Index(BMI): 41 Temperature(F): 98.3 Respiratory Rate 18 (breaths/min): Photos: [2:No Photos] [N/A:N/A] Wound Location: [2:Right Malleolus - Medial, Proximal] [N/A:N/A] Wounding Event: [2:Gradually Appeared] [N/A:N/A] Primary Etiology: [2:Venous Leg Ulcer] [  N/A:N/A] Comorbid History: [2:Sleep Apnea, Congestive Heart Failure, Deep Vein Thrombosis, Hypertension, Type II Diabetes, Gout] [N/A:N/A] Date Acquired: [2:07/31/2014] [N/A:N/A] Weeks of Treatment: [2:22] [N/A:N/A] Wound Status: [2:Open] [N/A:N/A] Measurements L x W x D 5.3x2.5x0.2 [N/A:N/A] (cm) Area (cm) : [2:10.407] [N/A:N/A] Volume (cm) : [2:2.081] [N/A:N/A] % Reduction in Area: [2:-1372.00%] [N/A:N/A] % Reduction in Volume: -2831.00% [N/A:N/A] Classification: [2:Full Thickness Without Exposed Support Structures] [N/A:N/A] HBO Classification: [2:Grade 1] [N/A:N/A] Exudate Amount: [2:Medium] [N/A:N/A] Exudate Type: [2:Serosanguineous] [N/A:N/A] Exudate Color: [2:red, brown] [N/A:N/A] Wound Margin: [2:Distinct, outline attached] [N/A:N/A] Granulation Amount: [2:Medium (34-66%)] [N/A:N/A] Granulation Quality: [2:Red, Pink] [N/A:N/A] Necrotic Amount: [2:Medium (34-66%)] [N/A:N/A] Exposed Structures: Fascia: No N/A N/A Fat: No Tendon: No Muscle: No Joint: No Bone: No Limited to Skin Breakdown Epithelialization:  Medium (34-66%) N/A N/A Periwound Skin Texture: Scarring: Yes N/A N/A Edema: No Excoriation: No Induration: No Callus: No Crepitus: No Fluctuance: No Friable: No Rash: No Periwound Skin Moist: Yes N/A N/A Moisture: Maceration: No Dry/Scaly: No Periwound Skin Color: Hemosiderin Staining: Yes N/A N/A Atrophie Blanche: No Cyanosis: No Ecchymosis: No Erythema: No Mottled: No Pallor: No Rubor: No Temperature: No Abnormality N/A N/A Tenderness on Yes N/A N/A Palpation: Wound Preparation: Ulcer Cleansing: N/A N/A Rinsed/Irrigated with Saline, Other: water and soap Topical Anesthetic Applied: Other: lidocaine 4% Treatment Notes Electronic Signature(s) Signed: 01/04/2015 5:10:45 PM By: Curtis Sites Entered By: Curtis Sites on 01/04/2015 15:44:31 Maurice Sanders ([id number]) -------------------------------------------------------------------------------- Multi-Disciplinary Care Plan Details Patient Name: Maurice Sanders Date of Service: 01/04/2015 3:15 PM Medical Record Number: [id number] Patient Account Number: [id number] Date of Birth/Sex: [birth year] (68 y.o. Male) Treating RN: Curtis Sites Primary Care Physician: Beverely Risen Other Clinician: Referring Physician: Beverely Risen Treating Physician/Extender: Rudene Re in Treatment: 32 Active Inactive Abuse / Safety / Falls / Self Care Management Nursing Diagnoses: Potential for falls Goals: Patient will remain injury free Date Initiated: 07/10/2014 Goal Status: Active Interventions: Assess fall risk on admission and as needed Notes: Nutrition Nursing Diagnoses: Potential for alteratiion in Nutrition/Potential for imbalanced nutrition Goals: Patient/caregiver agrees to and verbalizes understanding of need to use nutritional supplements and/or vitamins as prescribed Date Initiated: 07/10/2014 Goal Status: Active Interventions: Assess patient nutrition upon admission and as needed per  policy Notes: Orientation to the Wound Care Program Nursing Diagnoses: Knowledge deficit related to the wound healing center program Goals: Patient/caregiver will verbalize understanding of the Wound Healing Center Program [place], [place] ([id number]) Date Initiated: 07/10/2014 Goal Status: Active Interventions: Provide education on orientation to the wound center Notes: Wound/Skin Impairment Nursing Diagnoses: Impaired tissue integrity Goals: Ulcer/skin breakdown will have a volume reduction of 30% by week 4 Date Initiated: 07/10/2014 Goal Status: Active Interventions: Assess ulceration(s) every visit Notes: Electronic Signature(s) Signed: 01/04/2015 5:10:45 PM By: Curtis Sites Entered By: Curtis Sites on 01/04/2015 15:44:05 Maurice Sanders ([id number]) -------------------------------------------------------------------------------- Pain Assessment Details Patient Name: Maurice Sanders Date of Service: 01/04/2015 3:15 PM Medical Record Number: [id number] Patient Account Number: [id number] Date of Birth/Sex: 01-17-1946 (69 y.o. Male) Treating RN: Clover Mealy, RN, BSN, [place] Sink Primary Care Physician: Beverely Risen Other Clinician: Referring Physician: Beverely Risen Treating Physician/Extender: Rudene Re in Treatment: 25 Active Problems Location of Pain Severity and Description of Pain Patient Has Paino No Site Locations Pain Management and Medication Current Pain Management: Electronic Signature(s) Signed: 01/04/2015 4:46:46 PM By: Elpidio Eric BSN, RN Entered By: Elpidio Eric on 01/04/2015 15:26:47 Maurice Sanders ([id number]) -------------------------------------------------------------------------------- Patient/Caregiver Education Details Patient Name: Maurice Sanders Date of Service: 01/04/2015 3:15 PM Medical Record Number: [id number] Patient Account Number: [id number] Date of  Birth/Gender: 08/27/1945 (68 y.o. Male) Treating RN: Clover MealyAfful, RN, BSN, [place] Primary Care Physician: Beverely RisenKHAN,  FOZIA Other Clinician: Referring Physician: Beverely RisenKHAN, FOZIA Treating Physician/Extender: Rudene ReBritto, Errol Weeks in Treatment: 25 Education Assessment Education Provided To: Patient Education Topics Provided Wound/Skin Impairment: Methods: Explain/Verbal Responses: State content correctly Electronic Signature(s) Signed: 01/04/2015 4:46:46 PM By: Elpidio EricAfful, Rita BSN, RN Entered By: Elpidio EricAfful, Rita on 01/04/2015 16:35:05 Maurice Sanders, Maurice Sanders ([id number]) -------------------------------------------------------------------------------- Wound Assessment Details Patient Name: Maurice Sanders, Maurice Sanders Date of Service: 01/04/2015 3:15 PM Medical Record Number: [id number] Patient Account Number: [id number] Date of Birth/Sex: 01/28/1946 (68 y.o. Male) Treating RN: Afful, RN, BSN, Rita Primary Care Physician: Beverely RisenKHAN, FOZIA Other Clinician: Referring Physician: Beverely RisenKHAN, FOZIA Treating Physician/Extender: Rudene ReBritto, Errol Weeks in Treatment: 25 Wound Status Wound Number: 2 Primary Venous Leg Ulcer Etiology: Wound Location: Right Malleolus - Medial, Proximal Wound Open Status: Wounding Event: Gradually Appeared Comorbid Sleep Apnea, Congestive Heart Failure, Date Acquired: 07/31/2014 History: Deep Vein Thrombosis, Hypertension, Weeks Of Treatment: 22 Type II Diabetes, Gout Clustered Wound: No Photos Photo Uploaded By: Elpidio EricAfful, Rita on 01/04/2015 16:44:00 Wound Measurements Length: (cm) 5.3 Width: (cm) 2.5 Depth: (cm) 0.2 Area: (cm) 10.407 Volume: (cm) 2.081 % Reduction in Area: -1372% % Reduction in Volume: -2831% Epithelialization: Medium (34-66%) Tunneling: No Undermining: No Wound Description Full Thickness Without Exposed Foul Odor A Classification: Support Structures Diabetic Severity Grade 1 (Wagner): Wound Margin: Distinct, outline attached Exudate Amount: Medium Exudate Type: Serosanguineous Exudate Color: red, brown fter Cleansing: No Wound Bed Granulation Amount: Medium (34-66%) Exposed  Structure Maurice Sanders, Maurice Sanders ([id number]) Granulation Quality: Red, Pink Fascia Exposed: No Necrotic Amount: Medium (34-66%) Fat Layer Exposed: No Necrotic Quality: Adherent Slough Tendon Exposed: No Muscle Exposed: No Joint Exposed: No Bone Exposed: No Limited to Skin Breakdown Periwound Skin Texture Texture Color No Abnormalities Noted: No No Abnormalities Noted: No Callus: No Atrophie Blanche: No Crepitus: No Cyanosis: No Excoriation: No Ecchymosis: No Fluctuance: No Erythema: No Friable: No Hemosiderin Staining: Yes Induration: No Mottled: No Localized Edema: No Pallor: No Rash: No Rubor: No Scarring: Yes Temperature / Pain Moisture Temperature: No Abnormality No Abnormalities Noted: No Tenderness on Palpation: Yes Dry / Scaly: No Maceration: No Moist: Yes Wound Preparation Ulcer Cleansing: Rinsed/Irrigated with Saline, Other: water and soap, Topical Anesthetic Applied: Other: lidocaine 4%, Treatment Notes Wound #2 (Right, Proximal, Medial Malleolus) 1. Cleansed with: Cleanse wound with antibacterial soap and water 2. Anesthetic Topical Lidocaine 4% cream to wound bed prior to debridement 3. Peri-wound Care: Moisturizing lotion 4. Dressing Applied: Prisma Ag 5. Secondary Dressing Applied ABD Pad Dry Gauze 7. Secured with 4-Layer Compression System - Right Lower Extremity Notes Maurice Sanders, Maurice Sanders ([id number]) contact layer applied on non wounded areas Electronic Signature(s) Signed: 01/04/2015 4:46:46 PM By: Elpidio EricAfful, Rita BSN, RN Entered By: Elpidio EricAfful, Rita on 01/04/2015 15:37:52 Maurice Sanders, Maurice Sanders ([id number]) -------------------------------------------------------------------------------- Vitals Details Patient Name: Maurice Sanders, Maurice Sanders Date of Service: 01/04/2015 3:15 PM Medical Record Number: [id number] Patient Account Number: [id number] Date of Birth/Sex: 07/30/1946 [age] y.o. Male) Treating RN: Afful, RN, BSN, Rita Primary Care Physician: Beverely RisenKHAN, FOZIA Other  Clinician: Referring Physician: Beverely RisenKHAN, FOZIA Treating Physician/Extender: Rudene ReBritto, Errol Weeks in Treatment: 25 Vital Signs Time Taken: 15:26 Temperature (F): 98.3 Height (in): 71 Pulse (bpm): 66 Weight (lbs): 291.7 Respiratory Rate (breaths/min): 18 Body Mass Index (BMI): 40.7 Blood Pressure (mmHg): 176/83 Reference Range: 80 - 120 mg / dl Electronic Signature(s) Signed: 01/04/2015 4:46:46 PM By: Elpidio EricAfful, Rita BSN, RN Entered By: Elpidio EricAfful, Rita on 01/04/2015 15:30:56

## 2015-01-05 NOTE — Progress Notes (Signed)
Maurice Sanders, Clent ([id number]) Visit Report for 01/04/2015 Chief Complaint Document Details Patient Name: Maurice Sanders, Maurice Sanders Date of Service: 01/04/2015 3:15 PM Medical Record Number: [id number] Patient Account Number: [id number] Date of Birth/Sex: 11/13/1945 (69 y.o. Male) Treating RN: Primary Care Physician: Beverely RisenKHAN, FOZIA Other Clinician: Referring Physician: Beverely RisenKHAN, FOZIA Treating Physician/Extender: Rudene ReBritto, Abisai Deer Weeks in Treatment: 25 Information Obtained from: Patient Chief Complaint [name] has been coming for dressing changes twice a week, on Monday and Thursday. Over all he is doing fine he has his blood sugar under control and is managing his diuretics well. after changing over from calamine to zinc oxide he said his own Unna's boot still gave him a bit of itching and problems and he had significant swelling which made the Unna's boot fairly tight. Electronic Signature(s) Signed: 01/04/2015 4:40:03 PM By: Evlyn KannerBritto, Nakya Weyand MD, FACS Entered By: Evlyn KannerBritto, Johnny Latu on 01/04/2015 15:50:59 Maurice Sanders, Leavy ([id number]) -------------------------------------------------------------------------------- Debridement Details Patient Name: Maurice Sanders, Maurice Sanders Date of Service: 01/04/2015 3:15 PM Medical Record Number: [id number] Patient Account Number: [id number] Date of Birth/Sex: 04/18/1946 (68 y.o. Male) Treating RN: Primary Care Physician: Beverely RisenKHAN, FOZIA Other Clinician: Referring Physician: Beverely RisenKHAN, FOZIA Treating Physician/Extender: Rudene ReBritto, Beautifull Cisar Weeks in Treatment: 25 Debridement Performed for Wound #2 Right,Proximal,Medial Malleolus Assessment: Performed By: Physician Tristan SchroederBritto, Mykel Sponaugle J., MD Debridement: Debridement Pre-procedure Yes Verification/Time Out Taken: Start Time: 15:45 Pain Control: Lidocaine 4% Topical Solution Level: Skin/Subcutaneous Tissue Total Area Debrided (L x 5.3 (cm) x 2.5 (cm) = 13.25 (cm) W): Tissue and other Viable, Non-Viable, Eschar, Fibrin/Slough, Subcutaneous material  debrided: Instrument: Curette Bleeding: Minimum Hemostasis Achieved: Pressure End Time: 15:48 Procedural Pain: 0 Post Procedural Pain: 0 Response to Treatment: Procedure was tolerated well Post Debridement Measurements of Total Wound Length: (cm) 5.3 Width: (cm) 2.5 Depth: (cm) 0.3 Volume: (cm) 3.122 Electronic Signature(s) Signed: 01/04/2015 4:40:03 PM By: Evlyn KannerBritto, Juanice Warburton MD, FACS Entered By: Evlyn KannerBritto, Indigo Chaddock on 01/04/2015 15:50:40 Maurice Sanders, [place] ([id number]) -------------------------------------------------------------------------------- HPI Details Patient Name: Maurice Sanders, Maurice Sanders Date of Service: 01/04/2015 3:15 PM Medical Record Number: [id number] Patient Account Number: [id number] Date of Birth/Sex: 07/16/1946 [age] y.o. Male) Treating RN: Primary Care Physician: Beverely RisenKHAN, FOZIA Other Clinician: Referring Physician: Beverely RisenKHAN, FOZIA Treating Physician/Extender: Rudene ReBritto, Dajae Kizer Weeks in Treatment: 25 History of Present Illness Location: right lower extremity Quality: Patient reports experiencing a dull pain to affected area(s). Severity: patient denies any severe pain. Duration: he has had this wound for a few weeks now. Timing: Pain in wound is Intermittent (comes and goes Context: the patient has a long history of venous insufficiency. He has been very compliant with wearing his compression stockings. Modifying Factors: the patient normally wears compression stockings. Recently he had placed a fair amount of Vaseline which cause skin irritation. Associated Signs and Symptoms: Patient denies any fevers. He has had a moderate amount of drainage. HPI Description: The patient is 69 year-old male with a history of diabetes who presents with right lower extremity wound. He first presented to our clinic on 07/10/2014. He does have a history of venous insufficiency in the past. He has been very compliant with wearing his compression stockings. He recently Vaseline on his legs which caused skin  irritation and breakdown. The patient returns for follow-up today. he denies any fevers at home. However due to the fact that he had stopped his diuretic he has had a lot of edema of the lower extremity. He also has a lot of scaly skin on his right lower extremity. 10/02/14 -- he has resumed his diuretics on a regular basis and has been walking around adequately and  has overall improved. No pain or no discharge from the wounds. 10/09/14 -- the patient has been having some pain at night while sleeping and was requesting pain medications. He also says that due to taking his diuretics he's been getting some cramps and I have asked him to please see his PCP regarding both these problems. Also awaiting insurance clearance regarding his Apligraf. addendum: I have now got a clear copy of his vascular workup studies and have reviewed the entire details dated encounter 08/17/2014. the lower extremity venous duplex study done on 08/17/2014 revealed no evidence of deep vein thrombosis or superficial thrombophlebitis bilaterally, no incompetence of the greater small saphenous veins bilaterally, enlarged lymph nodes in the groin bilaterally. As seen by Dr. Gilda CreaseSchnier on the same day. Dr. Gilda CreaseSchnier recommended that he should continue wearing graduated compression stockings of class I which is 20-30 mmHg pressure on a daily basis and a prescription was given. He was to be reassessed in 6 months and the question of getting him lymphedema pumps were also discussed. 10/16/2014. Vascular note reviewed in detail. I have been able to review his notes from Dr. Gilda CreaseSchnier on 08/17/2014. 1.The venous duplex study done on 08/17/2014 showed that all veins visualized showed no evidence of DVT. 2. no incompetence of the greater small saphenous veins bilaterally. Maurice Sanders, Tracen ([id number]) The plan was that he had no surgery or intervention needed at this point. The long discussion had showed that the patient had chronic skin  changes accompanied by venous insufficiency and at this stage he would benefit from wearing graduated compression stockings of the 20-30 mmHg on a daily basis and review would be done in his office back again in 6 months. At that time the patient could have a lymph pump. except for the new abrasion on the right lower extremity just below his tibial tuberosity he is doing fine and his ulcers actually look much better. 10/23/14 -- Culture report on Maurice BayleyAnthony Mcnew --- His wound culture has grown Citrobacter koseri and Enterococcus faecalis. Final report is back but so far sensitive to Bactrim and ampicillin and ciprofloxacin. Bactrim DS was called in for 14 days. 11/13/2014 last week we changed his Unna boot on Monday and Thursday and this has helped him a lot. The edema has been much better the Unna boot hasn't slipped down and he has not had any skin abrasions. 11/20/2014 -- he saw the vascular surgeon Dr. Gilda CreaseSchnier this morning and he was pleased with his progress and has said he would be ordering him some lymphedema pumps. Other than that the patient is coming to change his Unna's boots twice a week and he is doing well with this. 11/27/2014 -- over Saturday and Sunday he started having more swelling in his leg and some pain and I believe his own Unna's boot caused him some discomfort and excoriation of skin. He does admit he took some more juices orally but he has continued his diuretic. 12/11/2014 -- he has completed his course of antibiotics and has had no fresh issues. He has had no pain no swelling and is managing well with his blood sugars and diuretics. 12/14/2014 between Monday and today he has had some dietary indiscretions and had some [place] food which may have made him retain a lot of fluid. His edema has increased markedly and a lot of more excoriation. 12/25/2014 -- This week he has really done well and his edema is under control and these had no pain or problems. His leg is  feeling  good. 01/04/2015 -- He has done great along the way and he has managed to keep his wrap for almost her entire week. No issues or problems. Electronic Signature(s) Signed: 01/04/2015 4:40:03 PM By: Evlyn Kanner MD, FACS Entered By: Evlyn Kanner on 01/04/2015 15:51:40 Maurice Sanders ([id number]) -------------------------------------------------------------------------------- Physical Exam Details Patient Name: Maurice Sanders Date of Service: 01/04/2015 3:15 PM Medical Record Number: [id number] Patient Account Number: [id number] Date of Birth/Sex: 07/03/1946 (68 y.o. Male) Treating RN: Primary Care Physician: Beverely Risen Other Clinician: Referring Physician: Beverely Risen Treating Physician/Extender: Rudene Re in Treatment: 25 Constitutional . Pulse regular. Respirations normal and unlabored. Afebrile. . Eyes Nonicteric. Reactive to light. Ears, Nose, Mouth, and Throat Lips, teeth, and gums WNL.Marland Kitchen Moist mucosa without lesions . Neck supple and nontender. No palpable supraclavicular or cervical adenopathy. Normal sized without goiter. Respiratory WNL. No retractions.. Cardiovascular Pedal Pulses WNL. No clubbing, cyanosis or edema. Integumentary (Hair, Skin) the entire leg looks very good and the small open ulcerations at his right ankle are looking much cleaner.. No crepitus or fluctuance. No peri-wound warmth or erythema. No masses.Marland Kitchen Psychiatric Judgement and insight Intact.. No evidence of depression, anxiety, or agitation.. Electronic Signature(s) Signed: 01/04/2015 4:40:03 PM By: Evlyn Kanner MD, FACS Entered By: Evlyn Kanner on 01/04/2015 15:52:20 Maurice Sanders ([id number]) -------------------------------------------------------------------------------- Physician Orders Details Patient Name: Maurice Sanders Date of Service: 01/04/2015 3:15 PM Medical Record Number: [id number] Patient Account Number: [id number] Date of Birth/Sex: 1945/10/07 (69 y.o. Male) Treating  RN: Curtis Sites Primary Care Physician: Beverely Risen Other Clinician: Referring Physician: Beverely Risen Treating Physician/Extender: Rudene Re in Treatment: 75 Verbal / Phone Orders: Yes Clinician: Curtis Sites Read Back and Verified: Yes Diagnosis Coding Wound Cleansing Wound #2 Right,Proximal,Medial Malleolus o May shower with protection. Anesthetic Wound #2 Right,Proximal,Medial Malleolus o Topical Lidocaine 4% cream applied to wound bed prior to debridement Skin Barriers/Peri-Wound Care Wound #2 Right,Proximal,Medial Malleolus o Barrier cream o Moisturizing lotion Primary Wound Dressing Wound #2 Right,Proximal,Medial Malleolus o Prisma Ag Secondary Dressing Wound #2 Right,Proximal,Medial Malleolus o Drawtex Dressing Change Frequency Wound #2 Right,Proximal,Medial Malleolus o Dressing is to be changed Monday and Thursday. Follow-up Appointments Wound #2 Right,Proximal,Medial Malleolus o Return Appointment in 1 week. o Nurse Visit as needed - Thursday for dressing change Edema Control Wound #2 Right,Proximal,Medial Malleolus o 4-Layer Compression System - Right Lower Extremity MERRIL, NAGY ([id number]) Electronic Signature(s) Signed: 01/04/2015 4:40:03 PM By: Evlyn Kanner MD, FACS Signed: 01/04/2015 5:10:45 PM By: Curtis Sites Entered By: Curtis Sites on 01/04/2015 15:49:04 Maurice Sanders ([id number]) -------------------------------------------------------------------------------- Problem List Details Patient Name: Maurice Sanders Date of Service: 01/04/2015 3:15 PM Medical Record Number: [id number] Patient Account Number: [id number] Date of Birth/Sex: Jul 02, 1946 (68 y.o. 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 Male) Treating RN: Primary Care Physician: Beverely Risen Other Clinician: Referring Physician: Beverely Risen Treating Physician/Extender: Rudene Re in Treatment: 25 Subjective Chief Complaint Information obtained from Patient Carrel has been coming  for dressing changes twice a week, on Monday and Thursday. Over all he is doing fine he has his blood sugar under control and is managing his diuretics well. after changing over from calamine to zinc oxide he said his own Unna's boot still gave him a bit of itching and problems and he had significant swelling which made the Unna's boot fairly tight. History of Present Illness (HPI) The following HPI elements were documented for the patient's wound: Location: right lower extremity Quality: Patient reports experiencing a dull pain to affected area(s). Severity: patient denies any severe pain. Duration: he has had this wound for a few weeks now. Timing: Pain in wound is Intermittent (comes and goes Context: the patient has a long history of venous insufficiency. He has been very compliant with wearing his compression stockings. Modifying Factors: the patient normally wears compression stockings. Recently he had placed a fair amount of Vaseline which cause skin irritation. Associated Signs and Symptoms: Patient denies any  fevers. He has had a moderate amount of drainage. The patient is 69 year-old male with a history of diabetes who presents with right lower extremity wound. He first presented to our clinic on 07/10/2014. He does have a history of venous insufficiency in the past. He has been very compliant with wearing his compression stockings. He recently Vaseline on his legs which caused skin irritation and breakdown. The patient returns for follow-up today. he denies any fevers at home. However due to the fact that he had stopped his diuretic he has had a lot of edema of the lower extremity. He also has a lot of scaly skin on his right lower extremity. 10/02/14 -- he has resumed his diuretics on a regular basis and has been walking around adequately and has overall improved. No pain or no discharge from the wounds. 10/09/14 -- the patient has been having some pain at night while sleeping and was requesting pain medications. He also says that due to taking his diuretics he's been getting some cramps and I have asked him to please see his PCP regarding both these problems. Also awaiting insurance clearance regarding his Apligraf. addendum: I have now got a clear copy of his vascular workup studies and have reviewed the entire details dated encounter 08/17/2014. the lower extremity venous duplex study done on 08/17/2014 revealed no evidence of deep vein thrombosis or superficial thrombophlebitis bilaterally, no incompetence of the greater small saphenous veins bilaterally, enlarged lymph nodes in the groin bilaterally. KIONDRE, GRENZ ([id number]) As seen by Dr. Gilda Crease on the same day. Dr. Gilda Crease recommended that he should continue wearing graduated compression stockings of class I which is 20-30 mmHg pressure on a daily basis and a prescription was given. He was to be reassessed in 6 months and the question of getting him lymphedema pumps were also discussed. 10/16/2014. Vascular note reviewed in detail. I  have been able to review his notes from Dr. Gilda Crease on 08/17/2014. 1.The venous duplex study done on 08/17/2014 showed that all veins visualized showed no evidence of DVT. 2. no incompetence of the greater small saphenous veins bilaterally. The plan was that he had no surgery or intervention needed at this point. The long discussion had showed that the patient had chronic skin changes accompanied by venous insufficiency and at this stage he would benefit from wearing graduated compression stockings of the 20-30 mmHg on a daily basis and review would be done in his office back again in 6 months. At that time the patient could have a  lymph pump. except for the new abrasion on the right lower extremity just below his tibial tuberosity he is doing fine and his ulcers actually look much better. 10/23/14 -- Culture report on Maurice Sanders --- His wound culture has grown Citrobacter koseri and Enterococcus faecalis. Final report is back but so far sensitive to Bactrim and ampicillin and ciprofloxacin. Bactrim DS was called in for 14 days. 11/13/2014 last week we changed his Unna boot on Monday and Thursday and this has helped him a lot. The edema has been much better the Unna boot hasn't slipped down and he has not had any skin abrasions. 11/20/2014 -- he saw the vascular surgeon Dr. Gilda Crease this morning and he was pleased with his progress and has said he would be ordering him some lymphedema pumps. Other than that the patient is coming to change his Unna's boots twice a week and he is doing well with this. 11/27/2014 -- over Saturday and Sunday he started having more swelling in his leg and some pain and I believe his own Unna's boot caused him some discomfort and excoriation of skin. He does admit he took some more juices orally but he has continued his diuretic. 12/11/2014 -- he has completed his course of antibiotics and has had no fresh issues. He has had no pain no swelling and is managing  well with his blood sugars and diuretics. 12/14/2014 between Monday and today he has had some dietary indiscretions and had some [place] food which may have made him retain a lot of fluid. His edema has increased markedly and a lot of more excoriation. 12/25/2014 -- This week he has really done well and his edema is under control and these had no pain or problems. His leg is feeling good. 01/04/2015 -- He has done great along the way and he has managed to keep his wrap for almost her entire week. No issues or problems. DAVIAN, HANSHAW ([id number]) Objective Constitutional Pulse regular. Respirations normal and unlabored. Afebrile. Vitals Time Taken: 3:26 PM, Height: 71 in, Weight: 291.7 lbs, BMI: 40.7, Temperature: 98.3 F, Pulse: 66 bpm, Respiratory Rate: 18 breaths/min, Blood Pressure: 176/83 mmHg. Eyes Nonicteric. Reactive to light. Ears, Nose, Mouth, and Throat Lips, teeth, and gums WNL.Marland Kitchen Moist mucosa without lesions . Neck supple and nontender. No palpable supraclavicular or cervical adenopathy. Normal sized without goiter. Respiratory WNL. No retractions.. Cardiovascular Pedal Pulses WNL. No clubbing, cyanosis or edema. Psychiatric Judgement and insight Intact.. No evidence of depression, anxiety, or agitation.. Integumentary (Hair, Skin) the entire leg looks very good and the small open ulcerations at his right ankle are looking much cleaner.. No crepitus or fluctuance. No peri-wound warmth or erythema. No masses.. Wound #2 status is Open. Original cause of wound was Gradually Appeared. The wound is located on the Right,Proximal,Medial Malleolus. The wound measures 5.3cm length x 2.5cm width x 0.2cm depth; 10.407cm^2 area and 2.081cm^3 volume. The wound is limited to skin breakdown. There is no tunneling or undermining noted. There is a medium amount of serosanguineous drainage noted. The wound margin is distinct with the outline attached to the wound base. There is medium  (34-66%) red, pink granulation within the wound bed. There is a medium (34-66%) amount of necrotic tissue within the wound bed including Adherent Slough. The periwound skin appearance exhibited: Scarring, Moist, Hemosiderin Staining. The periwound skin appearance did not exhibit: Callus, Crepitus, Excoriation, Fluctuance, Friable, Induration, Localized Edema, Rash, Dry/Scaly, Maceration, Atrophie Blanche, Cyanosis, Ecchymosis, Mottled, Pallor, Rubor, Erythema. Periwound temperature was noted as No  Abnormality. The periwound has tenderness on palpation. 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 He will continue with 4-layer wrap on a weekly basis. He will come back and see me next week. Procedures Wound #2 Wound #2 is a Venous Leg Ulcer located on the Right,Proximal,Medial Malleolus . There was a Skin/Subcutaneous Tissue Debridement ([id number]) debridement with total area of 13.25 sq cm performed by Tristan Schroeder., MD. with the following instrument(s): Curette to remove Viable and Non-Viable tissue/material including Fibrin/Slough, Eschar, and Subcutaneous after achieving pain control using Lidocaine 4% Topical Solution. A time out was conducted prior to the start of the procedure. A Minimum amount of bleeding was controlled with Pressure. The procedure was tolerated well with a pain level of 0 throughout and a pain level of 0 following the procedure. Post Debridement Measurements: 5.3cm length x 2.5cm width x 0.3cm depth; 3.122cm^3 volume. Plan Wound Cleansing: Wound #2 Right,Proximal,Medial Malleolus: May shower with protection. Anesthetic: Wound #2 Right,Proximal,Medial Malleolus: Topical Lidocaine 4% cream  applied to wound bed prior to debridement Skin Barriers/Peri-Wound Care: Wound #2 Right,Proximal,Medial Malleolus: Barrier cream Moisturizing lotion Primary Wound Dressing: Wound #2 Right,Proximal,Medial Malleolus: PATTRICK, BADY ([id number]) Prisma Ag Secondary Dressing: Wound #2 Right,Proximal,Medial Malleolus: Drawtex Dressing Change Frequency: Wound #2 Right,Proximal,Medial Malleolus: Dressing is to be changed Monday and Thursday. Follow-up Appointments: Wound #2 Right,Proximal,Medial Malleolus: Return Appointment in 1 week. Nurse Visit as needed - Thursday for dressing change Edema Control: Wound #2 Right,Proximal,Medial Malleolus: 4-Layer Compression System - Right Lower Extremity The ulceration is clean enough for some collagen silver and we will start preparing for a skin substitute. He will continue with 4-layer wrap on a weekly basis. He will come back and see me next week. Electronic Signature(s) Signed: 01/04/2015 4:40:03 PM By: Evlyn Kanner MD, FACS Entered By: Evlyn Kanner on 01/04/2015 15:53:18 Maurice Sanders ([id number]) -------------------------------------------------------------------------------- SuperBill Details Patient Name: Maurice Sanders Date of Service: 01/04/2015 Medical Record Number: [id number] Patient Account Number: [id number] Date of Birth/Sex: September 27, 1945 (69 y.o. 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 Facility Procedures CPT4 Code Description: [id number] 11042 - DEB SUBQ TISSUE 20 SQ CM/< ICD-10 Description Diagnosis I87.311 Chronic venous hypertension (idiopathic) with ulcer E11.620 Type 2 diabetes mellitus with diabetic  dermatitis Modifier: of right lowe Quantity: 1 r extremity Physician Procedures CPT4 Code Description: [id number] 11042 - WC PHYS SUBQ TISS 20 SQ CM ICD-10 Description Diagnosis 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

## 2015-01-11 ENCOUNTER — Encounter: Payer: Medicare Other | Admitting: Surgery

## 2015-01-11 DIAGNOSIS — E11620 Type 2 diabetes mellitus with diabetic dermatitis: Secondary | ICD-10-CM | POA: Diagnosis not present

## 2015-01-11 DIAGNOSIS — L97319 Non-pressure chronic ulcer of right ankle with unspecified severity: Secondary | ICD-10-CM | POA: Diagnosis not present

## 2015-01-11 DIAGNOSIS — I83013 Varicose veins of right lower extremity with ulcer of ankle: Secondary | ICD-10-CM | POA: Diagnosis not present

## 2015-01-11 DIAGNOSIS — I87311 Chronic venous hypertension (idiopathic) with ulcer of right lower extremity: Secondary | ICD-10-CM | POA: Diagnosis not present

## 2015-01-12 NOTE — Progress Notes (Signed)
COTEY, RAKES ([id number]) Visit Report for 01/11/2015 Arrival Information Details Patient Name: Maurice Sanders, Maurice Sanders Date of Service: 01/11/2015 10:15 AM Medical Record Number: [id number] Patient Account Number: [id number] Date of Birth/Sex: 21-Aug-1945 (69 y.o. Male) Treating RN: Afful, RN, BSN, [place] Sink Primary Care Physician: Beverely Risen Other Clinician: Referring Physician: Beverely Risen Treating Physician/Extender: Rudene Re in Treatment: 26 Visit Information History Since Last Visit Any new allergies or adverse reactions: No Patient Arrived: Ambulatory Had a fall or experienced change in No Arrival Time: 10:21 activities of daily living that may affect Accompanied By: self risk of falls: Transfer Assistance: None Hospitalized since last visit: No Patient Identification Verified: Yes Has Dressing in Place as Prescribed: Yes Secondary Verification Process Yes Has Compression in Place as Prescribed: Yes Completed: Pain Present Now: No Patient Requires Transmission- No Based Precautions: Patient Has Alerts: Yes Patient Alerts: Patient on Blood Thinner DM II ABI L 1.06; R 1.13 Electronic Signature(s) Signed: 01/11/2015 4:05:06 PM By: Elpidio Eric BSN, RN Entered By: Elpidio Eric on 01/11/2015 10:21:23 Maurice Sanders ([id number]) -------------------------------------------------------------------------------- Encounter Discharge Information Details Patient Name: Maurice Sanders Date of Service: 01/11/2015 10:15 AM Medical Record Number: [id number] Patient Account Number: [id number] Date of Birth/Sex: 09-15-1945 (69 y.o. Male) Treating RN: Afful, RN, BSN, [place] Sink Primary Care Physician: Beverely Risen Other Clinician: Referring Physician: Beverely Risen Treating Physician/Extender: Rudene Re in Treatment: 75 Encounter Discharge Information Items Discharge Pain Level: 0 Discharge Condition: Stable Ambulatory Status: Ambulatory Discharge Destination: Home Transportation: Private  Auto Accompanied By: self Schedule Follow-up Appointment: No Medication Reconciliation completed No and provided to Patient/Care Odelia Graciano: Patient Clinical Summary of Care: Declined Electronic Signature(s) Signed: 01/11/2015 4:05:06 PM By: Elpidio Eric BSN, RN Previous Signature: 01/11/2015 10:54:14 AM Version By: Gwenlyn Perking Entered By: Elpidio Eric on 01/11/2015 10:57:56 Maurice Sanders ([id number]) -------------------------------------------------------------------------------- Lower Extremity Assessment Details Patient Name: Maurice Sanders Date of Service: 01/11/2015 10:15 AM Medical Record Number: [id number] Patient Account Number: [id number] Date of Birth/Sex: 1946-04-20 (68 y.o. Male) Treating RN: Afful, RN, BSN, [place] Sink Primary Care Physician: Beverely Risen Other Clinician: Referring Physician: Beverely Risen Treating Physician/Extender: Rudene Re in Treatment: 26 Edema Assessment Assessed: [Left: No] [Right: No] Edema: [Left: Ye] [Right: s] Calf Left: Right: Point of Measurement: 33 cm From Medial Instep cm 36 cm Ankle Left: Right: Point of Measurement: 11 cm From Medial Instep cm 26 cm Vascular Assessment Claudication: Claudication Assessment [Right:None] Pulses: Posterior Tibial Dorsalis Pedis Palpable: [Right:Yes] Extremity colors, hair growth, and conditions: Extremity Color: [Right:Hyperpigmented] Hair Growth on Extremity: [Right:No] Capillary Refill: [Right:< 3 seconds] Blanched when Elevated: [Right:No] Toe Nail Assessment Left: Right: Thick: Yes Discolored: Yes Deformed: Yes Improper Length and Hygiene: Yes Electronic Signature(s) Signed: 01/11/2015 4:05:06 PM By: Elpidio Eric BSN, RN Entered By: Elpidio Eric on 01/11/2015 10:25:37 Maurice Sanders ([id number] Sanders, Maurice Sanders ([id number]) -------------------------------------------------------------------------------- Multi Wound Chart Details Patient Name: Maurice Sanders Date of Service: 01/11/2015 10:15  AM Medical Record Number: [id number] Patient Account Number: [id number] Date of Birth/Sex: 05/21/1946 (69 y.o. Male) Treating RN: Curtis Sites Primary Care Physician: Beverely Risen Other Clinician: Referring Physician: Beverely Risen Treating Physician/Extender: Rudene Re in Treatment: 26 Vital Signs Height(in): 71 Pulse(bpm): 69 Weight(lbs): 291.7 Blood Pressure 132/41 (mmHg): Body Mass Index(BMI): 41 Temperature(F): 97.6 Respiratory Rate 18 (breaths/min): Photos: [2:No Photos] [N/A:N/A] Wound Location: [2:Right Malleolus - Medial, Proximal] [N/A:N/A] Wounding Event: [2:Gradually Appeared] [N/A:N/A] Primary Etiology: [2:Venous Leg Ulcer] [N/A:N/A] Comorbid History: [2:Sleep Apnea, Congestive Heart Failure, Deep Vein Thrombosis, Hypertension, Type II Diabetes, Gout] [N/A:N/A] Date Acquired: [2:07/31/2014] [N/A:N/A] Weeks of Treatment: [  2:23] [N/A:N/A] Wound Status: [2:Open] [N/A:N/A] Measurements L x W x D 4.5x2.5x0.2 [N/A:N/A] (cm) Area (cm) : [2:8.836] [N/A:N/A] Volume (cm) : [2:1.767] [N/A:N/A] % Reduction in Area: [2:-1149.80%] [N/A:N/A] % Reduction in Volume: -2388.70% [N/A:N/A] Classification: [2:Full Thickness Without Exposed Support Structures] [N/A:N/A] HBO Classification: [2:Grade 1] [N/A:N/A] Exudate Amount: [2:Medium] [N/A:N/A] Exudate Type: [2:Serosanguineous] [N/A:N/A] Exudate Color: [2:red, brown] [N/A:N/A] Wound Margin: [2:Distinct, outline attached] [N/A:N/A] Granulation Amount: [2:Medium (34-66%)] [N/A:N/A] Granulation Quality: [2:Red, Pink] [N/A:N/A] Necrotic Amount: [2:Medium (34-66%)] [N/A:N/A] Exposed Structures: Fascia: No N/A N/A Fat: No Tendon: No Muscle: No Joint: No Bone: No Limited to Skin Breakdown Epithelialization: Medium (34-66%) N/A N/A Debridement: Open Wound/Selective N/A N/A ([id number]) - Selective Time-Out Taken: Yes N/A N/A Pain Control: Lidocaine 4% Topical N/A N/A Solution Level: Non-Viable Tissue N/A  N/A Debridement Area (sq 11.25 N/A N/A cm): Instrument: Curette N/A N/A Bleeding: Minimum N/A N/A Hemostasis Achieved: Pressure N/A N/A Procedural Pain: 0 N/A N/A Post Procedural Pain: 0 N/A N/A Debridement Treatment Procedure was tolerated N/A N/A Response: well Periwound Skin Texture: Scarring: Yes N/A N/A Edema: No Excoriation: No Induration: No Callus: No Crepitus: No Fluctuance: No Friable: No Rash: No Periwound Skin Moist: Yes N/A N/A Moisture: Dry/Scaly: Yes Maceration: No Periwound Skin Color: Hemosiderin Staining: Yes N/A N/A Atrophie Blanche: No Cyanosis: No Ecchymosis: No Erythema: No Mottled: No Pallor: No Rubor: No Temperature: No Abnormality N/A N/A Tenderness on No N/A N/A Palpation: Wound Preparation: Ulcer Cleansing: N/A N/A Rinsed/Irrigated with Saline, Other: water and Riemann, [place] ([id number]) soap Topical Anesthetic Applied: Other: lidocaine 4% Procedures Performed: Debridement N/A N/A Treatment Notes Electronic Signature(s) Signed: 01/11/2015 5:03:06 PM By: Curtis Sites Entered By: Curtis Sites on 01/11/2015 10:41:47 Maurice Sanders ([id number]) -------------------------------------------------------------------------------- Multi-Disciplinary Care Plan Details Patient Name: Maurice Sanders Date of Service: 01/11/2015 10:15 AM Medical Record Number: [id number] Patient Account Number: [id number] Date of Birth/Sex: 09-10-1945 (68 y.o. Male) Treating RN: Curtis Sites Primary Care Physician: Beverely Risen Other Clinician: Referring Physician: Beverely Risen Treating Physician/Extender: Rudene Re in Treatment: 18 Active Inactive Abuse / Safety / Falls / Self Care Management Nursing Diagnoses: Potential for falls Goals: Patient will remain injury free Date Initiated: 07/10/2014 Goal Status: Active Interventions: Assess fall risk on admission and as needed Notes: Nutrition Nursing Diagnoses: Potential for alteratiion in  Nutrition/Potential for imbalanced nutrition Goals: Patient/caregiver agrees to and verbalizes understanding of need to use nutritional supplements and/or vitamins as prescribed Date Initiated: 07/10/2014 Goal Status: Active Interventions: Assess patient nutrition upon admission and as needed per policy Notes: Orientation to the Wound Care Program Nursing Diagnoses: Knowledge deficit related to the wound healing center program Goals: Patient/caregiver will verbalize understanding of the Wound Healing Center Program Maurice Sanders, Maurice Sanders ([id number]) Date Initiated: 07/10/2014 Goal Status: Active Interventions: Provide education on orientation to the wound center Notes: Wound/Skin Impairment Nursing Diagnoses: Impaired tissue integrity Goals: Ulcer/skin breakdown will have a volume reduction of 30% by week 4 Date Initiated: 07/10/2014 Goal Status: Active Interventions: Assess ulceration(s) every visit Notes: Electronic Signature(s) Signed: 01/11/2015 5:03:06 PM By: Curtis Sites Entered By: Curtis Sites on 01/11/2015 10:41:39 Maurice Sanders ([id number]) -------------------------------------------------------------------------------- Pain Assessment Details Patient Name: Maurice Sanders Date of Service: 01/11/2015 10:15 AM Medical Record Number: [id number] Patient Account Number: [id number] Date of Birth/Sex: [birth year] (69 y.o. Male) Treating RN: Clover Mealy, RN, BSN, [place] Sink Primary Care Physician: Beverely Risen Other Clinician: Referring Physician: Beverely Risen Treating Physician/Extender: Rudene Re in Treatment: 26 Active Problems Location of Pain Severity and Description of Pain Patient Has Paino No Site Locations Pain Management  and Medication Current Pain Management: Electronic Signature(s) Signed: 01/11/2015 4:05:06 PM By: Elpidio Eric BSN, RN Entered By: Elpidio Eric on 01/11/2015 10:21:29 Maurice Sanders  ([id number]) -------------------------------------------------------------------------------- Patient/Caregiver Education Details Patient Name: Maurice Sanders Date of Service: 01/11/2015 10:15 AM Medical Record Number: [id number] Patient Account Number: [id number] Date of Birth/Gender: 04/20/1946 (68 y.o. Male) Treating RN: Curtis Sites Primary Care Physician: Beverely Risen Other Clinician: Referring Physician: Beverely Risen Treating Physician/Extender: Rudene Re in Treatment: 63 Education Assessment Education Provided To: Patient Education Topics Provided Wound/Skin Impairment: Handouts: Other: call for problems with wrap Methods: Demonstration, Explain/Verbal Responses: State content correctly Electronic Signature(s) Signed: 01/11/2015 5:03:06 PM By: Curtis Sites Entered By: Curtis Sites on 01/11/2015 10:42:43 Maurice Sanders ([id number]) -------------------------------------------------------------------------------- Wound Assessment Details Patient Name: Maurice Sanders Date of Service: 01/11/2015 10:15 AM Medical Record Number: [id number] Patient Account Number: [id number] Date of Birth/Sex: August 06, 1945 (69 y.o. Male) Treating RN: Afful, RN, BSN, [place] Primary Care Physician: Beverely Risen Other Clinician: Referring Physician: Beverely Risen Treating Physician/Extender: Rudene Re in Treatment: 26 Wound Status Wound Number: 2 Primary Venous Leg Ulcer Etiology: Wound Location: Right Malleolus - Medial, Proximal Wound Open Status: Wounding Event: Gradually Appeared Comorbid Sleep Apnea, Congestive Heart Failure, Date Acquired: 07/31/2014 History: Deep Vein Thrombosis, Hypertension, Weeks Of Treatment: 23 Type II Diabetes, Gout Clustered Wound: No Photos Photo Uploaded By: Elpidio Eric on 01/11/2015 12:16:51 Wound Measurements Length: (cm) 4.5 Width: (cm) 2.5 Depth: (cm) 0.2 Area: (cm) 8.836 Volume: (cm) 1.767 % Reduction in Area: -1149.8% % Reduction in  Volume: -2388.7% Epithelialization: Medium (34-66%) Tunneling: No Undermining: No Wound Description Full Thickness Without Exposed Foul Odor A Classification: Support Structures Diabetic Severity Grade 1 (Wagner): Wound Margin: Distinct, outline attached Exudate Amount: Medium Exudate Type: Serosanguineous Exudate Color: red, brown fter Cleansing: No Wound Bed Granulation Amount: Medium (34-66%) Exposed Structure Maurice Sanders, Maurice Sanders ([id number]) Granulation Quality: Red, Pink Fascia Exposed: No Necrotic Amount: Medium (34-66%) Fat Layer Exposed: No Necrotic Quality: Adherent Slough Tendon Exposed: No Muscle Exposed: No Joint Exposed: No Bone Exposed: No Limited to Skin Breakdown Periwound Skin Texture Texture Color No Abnormalities Noted: No No Abnormalities Noted: No Callus: No Atrophie Blanche: No Crepitus: No Cyanosis: No Excoriation: No Ecchymosis: No Fluctuance: No Erythema: No Friable: No Hemosiderin Staining: Yes Induration: No Mottled: No Localized Edema: No Pallor: No Rash: No Rubor: No Scarring: Yes Temperature / Pain Moisture Temperature: No Abnormality No Abnormalities Noted: No Dry / Scaly: Yes Maceration: No Moist: Yes Wound Preparation Ulcer Cleansing: Rinsed/Irrigated with Saline, Other: water and soap, Topical Anesthetic Applied: Other: lidocaine 4%, Treatment Notes Wound #2 (Right, Proximal, Medial Malleolus) 2. Anesthetic Topical Lidocaine 4% cream to wound bed prior to debridement 3. Peri-wound Care: Barrier cream Moisturizing lotion 4. Dressing Applied: Prisma Ag 5. Secondary Dressing Applied ABD Pad 7. Secured with 4-Layer Compression System - Right Lower Extremity Notes contact layer applied on non wounded areas Maurice Sanders ([id number]) Electronic Signature(s) Signed: 01/11/2015 4:05:06 PM By: Elpidio Eric BSN, RN Entered By: Elpidio Eric on 01/11/2015 10:28:17 Maurice Sanders  ([id number]) -------------------------------------------------------------------------------- Vitals Details Patient Name: Maurice Sanders Date of Service: 01/11/2015 10:15 AM Medical Record Number: [id number] Patient Account Number: [id number] Date of Birth/Sex: 1946-07-17 (69 y.o. Male) Treating RN: Afful, RN, BSN, [place] Primary Care Physician: Beverely Risen Other Clinician: Referring Physician: Beverely Risen Treating Physician/Extender: Rudene Re in Treatment: 26 Vital Signs Time Taken: 10:21 Temperature (F): 97.6 Height (in): 71 Pulse (bpm): 69 Weight (lbs): 291.7 Respiratory Rate (breaths/min): 18 Body Mass Index (BMI): 40.7 Blood Pressure (mmHg):  132/41 Reference Range: 80 - 120 mg / dl Electronic Signature(s) Signed: 01/11/2015 4:05:06 PM By: Elpidio Eric BSN, RN Entered By: Elpidio Eric on 01/11/2015 10:25:07

## 2015-01-12 NOTE — Progress Notes (Signed)
Maurice Sanders, Maurice Sanders ([id number]) Visit Report for 01/11/2015 Chief Complaint Document Details Patient Name: Maurice Sanders, Maurice Sanders Date of Service: 01/11/2015 10:15 AM Medical Record Number: [id number] Patient Account Number: [id number] Date of Birth/Sex: 05/12/1946 (69 y.o. Male) Treating RN: Primary Care Physician: Beverely Risen Other Clinician: Referring Physician: Beverely Risen Treating Physician/Extender: Rudene Re in Treatment: 94 Information Obtained from: Patient Chief Complaint Brier has been coming for dressing changes twice a week, on Monday and Thursday. Over all he is doing fine he has his blood sugar under control and is managing his diuretics well. after changing over from calamine to zinc oxide he said his own Unna's boot still gave him a bit of itching and problems and he had significant swelling which made the Unna's boot fairly tight. Electronic Signature(s) Signed: 01/11/2015 4:41:50 PM By: Evlyn Kanner MD, FACS Entered By: Evlyn Kanner on 01/11/2015 10:44:04 Maurice Sanders ([id number]) -------------------------------------------------------------------------------- Debridement Details Patient Name: Maurice Sanders Date of Service: 01/11/2015 10:15 AM Medical Record Number: [id number] Patient Account Number: [id number] Date of Birth/Sex: May 31, 1946 (68 y.o. Male) Treating RN: Primary Care Physician: Beverely Risen Other Clinician: Referring Physician: Beverely Risen Treating Physician/Extender: Rudene Re in Treatment: 26 Debridement Performed for Wound #2 Right,Proximal,Medial Malleolus Assessment: Performed By: Physician Tristan Schroeder., MD Debridement: Open Wound/Selective Debridement Selective Description: Pre-procedure Yes Verification/Time Out Taken: Start Time: 10:38 Pain Control: Lidocaine 4% Topical Solution Level: Non-Viable Tissue Total Area Debrided (L x 4.5 (cm) x 2.5 (cm) = 11.25 (cm) W): Tissue and other Non-Viable, Exudate,  Fibrin/Slough material debrided: Instrument: Curette Bleeding: Minimum Hemostasis Achieved: Pressure End Time: 10:40 Procedural Pain: 0 Post Procedural Pain: 0 Response to Treatment: Procedure was tolerated well Post Debridement Measurements of Total Wound Length: (cm) 4.5 Width: (cm) 2.5 Depth: (cm) 0.2 Volume: (cm) 1.767 Electronic Signature(s) Signed: 01/11/2015 4:41:50 PM By: Evlyn Kanner MD, FACS Entered By: Evlyn Kanner on 01/11/2015 10:43:57 Maurice Sanders ([id number]) -------------------------------------------------------------------------------- HPI Details Patient Name: Maurice Sanders Date of Service: 01/11/2015 10:15 AM Medical Record Number: [id number] Patient Account Number: [id number] Date of Birth/Sex: 02-Dec-1945 (69 y.o. Male) Treating RN: Primary Care Physician: Beverely Risen Other Clinician: Referring Physician: Beverely Risen Treating Physician/Extender: Rudene Re in Treatment: 26 History of Present Illness Location: right lower extremity Quality: Patient reports experiencing a dull pain to affected area(s). Severity: patient denies any severe pain. Duration: he has had this wound for a few weeks now. Timing: Pain in wound is Intermittent (comes and goes Context: the patient has a long history of venous insufficiency. He has been very compliant with wearing his compression stockings. Modifying Factors: the patient normally wears compression stockings. Recently he had placed a fair amount of Vaseline which cause skin irritation. Associated Signs and Symptoms: Patient denies any fevers. He has had a moderate amount of drainage. HPI Description: The patient is 69 year-old male with a history of diabetes who presents with right lower extremity wound. He first presented to our clinic on 07/10/2014. He does have a history of venous insufficiency in the past. He has been very compliant with wearing his compression stockings. He recently Vaseline on his legs  which caused skin irritation and breakdown. The patient returns for follow-up today. he denies any fevers at home. However due to the fact that he had stopped his diuretic he has had a lot of edema of the lower extremity. He also has a lot of scaly skin on his right lower extremity. 10/02/14 -- he has resumed his diuretics on a regular basis and has been walking around  adequately and has overall improved. No pain or no discharge from the wounds. 10/09/14 -- the patient has been having some pain at night while sleeping and was requesting pain medications. He also says that due to taking his diuretics he's been getting some cramps and I have asked him to please see his PCP regarding both these problems. Also awaiting insurance clearance regarding his Apligraf. addendum: I have now got a clear copy of his vascular workup studies and have reviewed the entire details dated encounter 08/17/2014. the lower extremity venous duplex study done on 08/17/2014 revealed no evidence of deep vein thrombosis or superficial thrombophlebitis bilaterally, no incompetence of the greater small saphenous veins bilaterally, enlarged lymph nodes in the groin bilaterally. As seen by Dr. Gilda Crease on the same day. Dr. Gilda Crease recommended that he should continue wearing graduated compression stockings of class I which is 20-30 mmHg pressure on a daily basis and a prescription was given. He was to be reassessed in 6 months and the question of getting him lymphedema pumps were also discussed. 10/16/2014. Vascular note reviewed in detail. I have been able to review his notes from Dr. Gilda Crease on 08/17/2014. 1.The venous duplex study done on 08/17/2014 showed that all veins visualized showed no evidence of DVT. 2. no incompetence of the greater small saphenous veins bilaterally. Maurice Sanders, Maurice Sanders ([id number]) The plan was that he had no surgery or intervention needed at this point. The long discussion had showed that the patient  had chronic skin changes accompanied by venous insufficiency and at this stage he would benefit from wearing graduated compression stockings of the 20-30 mmHg on a daily basis and review would be done in his office back again in 6 months. At that time the patient could have a lymph pump. except for the new abrasion on the right lower extremity just below his tibial tuberosity he is doing fine and his ulcers actually look much better. 10/23/14 -- Culture report on Maurice Sanders --- His wound culture has grown Citrobacter koseri and Enterococcus faecalis. Final report is back but so far sensitive to Bactrim and ampicillin and ciprofloxacin. Bactrim DS was called in for 14 days. 11/13/2014 last week we changed his Unna boot on Monday and Thursday and this has helped him a lot. The edema has been much better the Unna boot hasn't slipped down and he has not had any skin abrasions. 11/20/2014 -- he saw the vascular surgeon Dr. Gilda Crease this morning and he was pleased with his progress and has said he would be ordering him some lymphedema pumps. Other than that the patient is coming to change his Unna's boots twice a week and he is doing well with this. 11/27/2014 -- over Saturday and Sunday he started having more swelling in his leg and some pain and I believe his own Unna's boot caused him some discomfort and excoriation of skin. He does admit he took some more juices orally but he has continued his diuretic. 12/11/2014 -- he has completed his course of antibiotics and has had no fresh issues. He has had no pain no swelling and is managing well with his blood sugars and diuretics. 12/14/2014 between Monday and today he has had some dietary indiscretions and had some [place] food which may have made him retain a lot of fluid. His edema has increased markedly and a lot of more excoriation. 12/25/2014 -- This week he has really done well and his edema is under control and these had no pain or problems.  His leg  is feeling good. 01/04/2015 -- He has done great along the way and he has managed to keep his wrap for almost her entire week. No issues or problems. Electronic Signature(s) Signed: 01/11/2015 4:41:50 PM By: Evlyn Kanner MD, FACS Entered By: Evlyn Kanner on 01/11/2015 10:45:23 Maurice Sanders ([id number]) -------------------------------------------------------------------------------- Physical Exam Details Patient Name: Maurice Sanders Date of Service: 01/11/2015 10:15 AM Medical Record Number: [id number] Patient Account Number: [id number] Date of Birth/Sex: April 10, 1946 (69 y.o. Male) Treating RN: Primary Care Physician: Beverely Risen Other Clinician: Referring Physician: Beverely Risen Treating Physician/Extender: Rudene Re in Treatment: 26 Constitutional . Pulse regular. Respirations normal and unlabored. Afebrile. . Eyes Nonicteric. Reactive to light. Ears, Nose, Mouth, and Throat Lips, teeth, and gums WNL.Marland Kitchen Moist mucosa without lesions . Neck supple and nontender. No palpable supraclavicular or cervical adenopathy. Normal sized without goiter. Respiratory WNL. No retractions.. Cardiovascular Pedal Pulses WNL. the edema has gone down significantly and his leg is looking good. Integumentary (Hair, Skin) the ulcerations have gotten smaller and there is some fibrin and debris on this but the fibrosis is much better.. No crepitus or fluctuance. No peri-wound warmth or erythema. No masses.Marland Kitchen Psychiatric Judgement and insight Intact.. No evidence of depression, anxiety, or agitation.. Electronic Signature(s) Signed: 01/11/2015 4:41:50 PM By: Evlyn Kanner MD, FACS Entered By: Evlyn Kanner on 01/11/2015 10:46:11 Maurice Sanders ([id number]) -------------------------------------------------------------------------------- Physician Orders Details Patient Name: Maurice Sanders Date of Service: 01/11/2015 10:15 AM Medical Record Number: [id number] Patient Account Number:  [id number] Date of Birth/Sex: Dec 10, 1945 (69 y.o. Male) Treating RN: Curtis Sites Primary Care Physician: Beverely Risen Other Clinician: Referring Physician: Beverely Risen Treating Physician/Extender: Rudene Re in Treatment: 12 Verbal / Phone Orders: Yes Clinician: Curtis Sites Read Back and Verified: Yes Diagnosis Coding Wound Cleansing Wound #2 Right,Proximal,Medial Malleolus o May shower with protection. Anesthetic Wound #2 Right,Proximal,Medial Malleolus o Topical Lidocaine 4% cream applied to wound bed prior to debridement Skin Barriers/Peri-Wound Care Wound #2 Right,Proximal,Medial Malleolus o Barrier cream o Moisturizing lotion Primary Wound Dressing Wound #2 Right,Proximal,Medial Malleolus o Prisma Ag Secondary Dressing Wound #2 Right,Proximal,Medial Malleolus o Drawtex Dressing Change Frequency Wound #2 Right,Proximal,Medial Malleolus o Dressing is to be changed Monday and Thursday. Follow-up Appointments Wound #2 Right,Proximal,Medial Malleolus o Return Appointment in 1 week. Edema Control Wound #2 Right,Proximal,Medial Malleolus o 4-Layer Compression System - Right Lower Extremity Maurice Sanders, Maurice Sanders ([id number]) Electronic Signature(s) Signed: 01/11/2015 4:41:50 PM By: Evlyn Kanner MD, FACS Signed: 01/11/2015 5:03:06 PM By: Curtis Sites Entered By: Curtis Sites on 01/11/2015 10:42:18 Maurice Sanders ([id number]) -------------------------------------------------------------------------------- Problem List Details Patient Name: Maurice Sanders Date of Service: 01/11/2015 10:15 AM Medical Record Number: [id number] Patient Account Number: [id number] Date of Birth/Sex: 12/05/1945 (68 y.o. 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 Male) Treating RN: Primary Care Physician: Beverely Risen Other Clinician: Referring Physician: Beverely Risen Treating Physician/Extender: Rudene Re in  Treatment: 26 Subjective Chief Complaint Information obtained from Patient [place] has been coming for dressing changes twice a week, on Monday and Thursday. Over all he is doing fine he has his blood sugar under control and is managing his diuretics well. after changing over from calamine to zinc oxide he said his own Unna's boot still gave him a bit of itching and problems and he had significant swelling which made the Unna's boot fairly tight. History of Present Illness (HPI) The following HPI elements were documented for the patient's wound: Location: right lower extremity Quality: Patient reports experiencing a dull pain to affected area(s). Severity: patient denies any severe pain. Duration: he has had this wound for a few weeks now. Timing: Pain in wound is Intermittent (comes and goes Context: the patient has a long history of venous insufficiency. He has been very compliant with wearing his compression stockings. Modifying Factors: the patient normally wears compression stockings. Recently he had placed a fair amount of  Vaseline which cause skin irritation. Associated Signs and Symptoms: Patient denies any fevers. He has had a moderate amount of drainage. The patient is 69 year-old male with a history of diabetes who presents with right lower extremity wound. He first presented to our clinic on 07/10/2014. He does have a history of venous insufficiency in the past. He has been very compliant with wearing his compression stockings. He recently Vaseline on his legs which caused skin irritation and breakdown. The patient returns for follow-up today. he denies any fevers at home. However due to the fact that he had stopped his diuretic he has had a lot of edema of the lower extremity. He also has a lot of scaly skin on his right lower extremity. 10/02/14 -- he has resumed his diuretics on a regular basis and has been walking around adequately and has overall improved. No pain or no discharge from the wounds. 10/09/14 -- the patient has been having some pain at night while sleeping and was requesting pain medications. He also says that due to taking his diuretics he's been getting some cramps and I have asked him to please see his PCP regarding both these problems. Also awaiting insurance clearance regarding his Apligraf. addendum: I have now got a clear copy of his vascular workup studies and have reviewed the entire details dated encounter 08/17/2014. the lower extremity venous duplex study done on 08/17/2014 revealed no evidence of deep vein thrombosis or superficial thrombophlebitis bilaterally, no incompetence of the greater small saphenous veins bilaterally, enlarged lymph nodes in the groin bilaterally. Maurice Sanders, Maurice Sanders ([id number]) As seen by Dr. Gilda Crease on the same day. Dr. Gilda Crease recommended that he should continue wearing graduated compression stockings of class I which is 20-30 mmHg pressure on a daily basis and a prescription was given. He was to be reassessed in 6 months and the question of getting him  lymphedema pumps were also discussed. 10/16/2014. Vascular note reviewed in detail. I have been able to review his notes from Dr. Gilda Crease on 08/17/2014. 1.The venous duplex study done on 08/17/2014 showed that all veins visualized showed no evidence of DVT. 2. no incompetence of the greater small saphenous veins bilaterally. The plan was that he had no surgery or intervention needed at this point. The long discussion had showed that the patient had chronic skin changes accompanied by venous insufficiency and at this stage he would benefit from wearing graduated compression stockings of the 20-30 mmHg on a daily basis and review would be done in his office  back again in 6 months. At that time the patient could have a lymph pump. except for the new abrasion on the right lower extremity just below his tibial tuberosity he is doing fine and his ulcers actually look much better. 10/23/14 -- Culture report on Maurice Sanders --- His wound culture has grown Citrobacter koseri and Enterococcus faecalis. Final report is back but so far sensitive to Bactrim and ampicillin and ciprofloxacin. Bactrim DS was called in for 14 days. 11/13/2014 last week we changed his Unna boot on Monday and Thursday and this has helped him a lot. The edema has been much better the Unna boot hasn't slipped down and he has not had any skin abrasions. 11/20/2014 -- he saw the vascular surgeon Dr. Gilda Crease this morning and he was pleased with his progress and has said he would be ordering him some lymphedema pumps. Other than that the patient is coming to change his Unna's boots twice a week and he is doing well with this. 11/27/2014 -- over Saturday and Sunday he started having more swelling in his leg and some pain and I believe his own Unna's boot caused him some discomfort and excoriation of skin. He does admit he took some more juices orally but he has continued his diuretic. 12/11/2014 -- he has completed his course of  antibiotics and has had no fresh issues. He has had no pain no swelling and is managing well with his blood sugars and diuretics. 12/14/2014 between Monday and today he has had some dietary indiscretions and had some [place] food which may have made him retain a lot of fluid. His edema has increased markedly and a lot of more excoriation. 12/25/2014 -- This week he has really done well and his edema is under control and these had no pain or problems. His leg is feeling good. 01/04/2015 -- He has done great along the way and he has managed to keep his wrap for almost her entire week. No issues or problems. Maurice Sanders, Maurice Sanders ([id number]) Objective Constitutional Pulse regular. Respirations normal and unlabored. Afebrile. Vitals Time Taken: 10:21 AM, Height: 71 in, Weight: 291.7 lbs, BMI: 40.7, Temperature: 97.6 F, Pulse: 69 bpm, Respiratory Rate: 18 breaths/min, Blood Pressure: 132/41 mmHg. Eyes Nonicteric. Reactive to light. Ears, Nose, Mouth, and Throat Lips, teeth, and gums WNL.Marland Kitchen Moist mucosa without lesions . Neck supple and nontender. No palpable supraclavicular or cervical adenopathy. Normal sized without goiter. Respiratory WNL. No retractions.. Cardiovascular Pedal Pulses WNL. the edema has gone down significantly and his leg is looking good. Psychiatric Judgement and insight Intact.. No evidence of depression, anxiety, or agitation.. Integumentary (Hair, Skin) the ulcerations have gotten smaller and there is some fibrin and debris on this but the fibrosis is much better.. No crepitus or fluctuance. No peri-wound warmth or erythema. No masses.. Wound #2 status is Open. Original cause of wound was Gradually Appeared. The wound is located on the Right,Proximal,Medial Malleolus. The wound measures 4.5cm length x 2.5cm width x 0.2cm depth; 8.836cm^2 area and 1.767cm^3 volume. The wound is limited to skin breakdown. There is no tunneling or undermining noted. There is a medium amount  of serosanguineous drainage noted. The wound margin is distinct with the outline attached to the wound base. There is medium (34-66%) red, pink granulation within the wound bed. There is a medium (34-66%) amount of necrotic tissue within the wound bed including Adherent Slough. The periwound skin appearance exhibited: Scarring, Dry/Scaly, Moist, Hemosiderin Staining. The periwound skin appearance did not exhibit: Callus, Crepitus, Excoriation,  Fluctuance, Friable, Induration, Localized Edema, Rash, Maceration, Atrophie Blanche, Cyanosis, Ecchymosis, Mottled, Pallor, Rubor, Erythema. Periwound temperature was noted as No Abnormality. 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 He has been seen by the vascular surgeons in the past and all procedures have been done. His venous hypertension which was caused by incompetence of his veins is now reached a stage where I believe he will benefit from application of a skin substitute and we are working with his insurance company to try and get this secured. Procedures Wound #2 Wound #2 is a Venous Leg Ulcer located on the Right,Proximal,Medial Malleolus . There was a Non-Viable Tissue Open Wound/Selective [phone]) debridement with total area of 11.25 sq cm performed by Tristan Schroeder., MD. with the following instrument(s): Curette to remove Non-Viable tissue/material including Exudate and Fibrin/Slough after achieving pain control using Lidocaine 4% Topical Solution. A time out was conducted prior to the start of the procedure. A Minimum amount of bleeding was controlled with Pressure. The procedure was tolerated well with a pain  level of 0 throughout and a pain level of 0 following the procedure. Post Debridement Measurements: 4.5cm length x 2.5cm width x 0.2cm depth; 1.767cm^3 volume. Plan Wound Cleansing: Wound #2 Right,Proximal,Medial Malleolus: May shower with protection. Anesthetic: Wound #2 Right,Proximal,Medial Malleolus: Topical Lidocaine 4% cream applied to wound bed prior to debridement Skin Barriers/Peri-Wound Care: Wound #2 Right,Proximal,Medial Malleolus: Barrier cream Carbine, Kadden ([id number]) Moisturizing lotion Primary Wound Dressing: Wound #2 Right,Proximal,Medial Malleolus: Prisma Ag Secondary Dressing: Wound #2 Right,Proximal,Medial Malleolus: Drawtex Dressing Change Frequency: Wound #2 Right,Proximal,Medial Malleolus: Dressing is to be changed Monday and Thursday. Follow-up Appointments: Wound #2 Right,Proximal,Medial Malleolus: Return Appointment in 1 week. Edema Control: Wound #2 Right,Proximal,Medial Malleolus: 4-Layer Compression System - Right Lower Extremity We will continue managing him with application of Prisma AG and a 4-layer compression. He has been seen by the vascular surgeons in the past and all procedures have been done. His venous hypertension which was caused by incompetence of his veins is now reached a stage where I believe he will benefit from application of a skin substitute and we are working with his insurance company to try and get this secured. He will come back and see as next week. Electronic Signature(s) Signed: 01/11/2015 4:41:50 PM By: Evlyn Kanner MD, FACS Entered By: Evlyn Kanner on 01/11/2015 10:47:59 Maurice Sanders ([id number]) -------------------------------------------------------------------------------- SuperBill Details Patient Name: Maurice Sanders Date of Service: 01/11/2015 Medical Record Number: [id number] Patient Account Number: [id number] Date of Birth/Sex: [birth year] (69 y.o. 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 ulcer of ankle Facility Procedures CPT4 Code Description: [id number] - DEBRIDE WOUND 1ST 20 SQ CM OR < ICD-10 Description Diagnosis 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 I83.013 Varicose veins of right  lower extremity with ulcer o Modifier: of right lowe f ankle Quantity: 1 r extremity Physician Procedures CPT4 Code Description: [id number] - WC PHYS DEBR  WO ANESTH 20 SQ CM ICD-10 Description Diagnosis 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

## 2015-01-18 ENCOUNTER — Encounter: Payer: Medicare Other | Admitting: Surgery

## 2015-01-18 DIAGNOSIS — E11620 Type 2 diabetes mellitus with diabetic dermatitis: Secondary | ICD-10-CM | POA: Diagnosis not present

## 2015-01-18 DIAGNOSIS — L97319 Non-pressure chronic ulcer of right ankle with unspecified severity: Secondary | ICD-10-CM | POA: Diagnosis not present

## 2015-01-18 DIAGNOSIS — I83013 Varicose veins of right lower extremity with ulcer of ankle: Secondary | ICD-10-CM | POA: Diagnosis not present

## 2015-01-18 DIAGNOSIS — I87311 Chronic venous hypertension (idiopathic) with ulcer of right lower extremity: Secondary | ICD-10-CM | POA: Diagnosis not present

## 2015-01-18 NOTE — Progress Notes (Signed)
VANCE, HOCHMUTH ([id number]) Visit Report for 01/18/2015 Chief Complaint Document Details Patient Name: TAVIEN, CHESTNUT Date of Service: 01/18/2015 8:00 AM Medical Record Number: [id number] Patient Account Number: [id number] Date of Birth/Sex: May 04, 1946 (69 y.o. Male) Treating RN: Primary Care Physician: Beverely Risen Other Clinician: Referring Physician: Beverely Risen Treating Physician/Extender: Rudene Re in Treatment: 71 Information Obtained from: Patient Chief Complaint Maurice Sanders has been coming for dressing changes twice a week, on Monday and Thursday. Over all he is doing fine he has his blood sugar under control and is managing his diuretics well. after changing over from calamine to zinc oxide he said his own Unna's boot still gave him a bit of itching and problems and he had significant swelling which made the Unna's boot fairly tight. Electronic Signature(s) Signed: 01/18/2015 1:17:45 PM By: Evlyn Kanner MD, FACS Entered By: Evlyn Kanner on 01/18/2015 08:42:15 Maurice Sanders ([id number]) -------------------------------------------------------------------------------- Debridement Details Patient Name: Maurice Sanders Date of Service: 01/18/2015 8:00 AM Medical Record Number: [id number] Patient Account Number: [id number] Date of Birth/Sex: 19-Aug-1945 (69 y.o. Male) Treating RN: Primary Care Physician: Beverely Risen Other Clinician: Referring Physician: Beverely Risen Treating Physician/Extender: Rudene Re in Treatment: 27 Debridement Performed for Wound #2 Right,Proximal,Medial Malleolus Assessment: Performed By: Physician Tristan Schroeder., MD Debridement: Debridement Pre-procedure Yes Verification/Time Out Taken: Start Time: 08:26 Pain Control: Lidocaine 4% Topical Solution Level: Skin/Subcutaneous Tissue Total Area Debrided (L x 3.4 (cm) x 3 (cm) = 10.2 (cm) W): Tissue and other Non-Viable, Exudate, Fibrin/Slough, Subcutaneous material debrided: Instrument:  Curette Bleeding: Minimum Hemostasis Achieved: Pressure End Time: 08:29 Procedural Pain: 0 Post Procedural Pain: 0 Response to Treatment: Procedure was tolerated well Post Debridement Measurements of Total Wound Length: (cm) 3.4 Width: (cm) 3 Depth: (cm) 0.2 Volume: (cm) 1.602 Electronic Signature(s) Signed: 01/18/2015 1:17:45 PM By: Evlyn Kanner MD, FACS Entered By: Evlyn Kanner on 01/18/2015 08:42:02 Maurice Sanders ([id number]) -------------------------------------------------------------------------------- HPI Details Patient Name: Maurice Sanders Date of Service: 01/18/2015 8:00 AM Medical Record Number: [id number] Patient Account Number: [id number] Date of Birth/Sex: 1945/10/06 (69 y.o. Male) Treating RN: Primary Care Physician: Beverely Risen Other Clinician: Referring Physician: Beverely Risen Treating Physician/Extender: Rudene Re in Treatment: 74 History of Present Illness Location: right lower extremity Quality: Patient reports experiencing a dull pain to affected area(s). Severity: patient denies any severe pain. Duration: he has had this wound for a few weeks now. Timing: Pain in wound is Intermittent (comes and goes Context: the patient has a long history of venous insufficiency. He has been very compliant with wearing his compression stockings. Modifying Factors: the patient normally wears compression stockings. Recently he had placed a fair amount of Vaseline which cause skin irritation. Associated Signs and Symptoms: Patient denies any fevers. He has had a moderate amount of drainage. HPI Description: The patient is 69 year-old male with a history of diabetes who presents with right lower extremity wound. He first presented to our clinic on 07/10/2014. He does have a history of venous insufficiency in the past. He has been very compliant with wearing his compression stockings. He recently Vaseline on his legs which caused skin irritation and breakdown. The  patient returns for follow-up today. he denies any fevers at home. However due to the fact that he had stopped his diuretic he has had a lot of edema of the lower extremity. He also has a lot of scaly skin on his right lower extremity. 10/02/14 -- he has resumed his diuretics on a regular basis and has been walking around adequately and has  overall improved. No pain or no discharge from the wounds. 10/09/14 -- the patient has been having some pain at night while sleeping and was requesting pain medications. He also says that due to taking his diuretics he's been getting some cramps and I have asked him to please see his PCP regarding both these problems. Also awaiting insurance clearance regarding his Apligraf. addendum: I have now got a clear copy of his vascular workup studies and have reviewed the entire details dated encounter 08/17/2014. the lower extremity venous duplex study done on 08/17/2014 revealed no evidence of deep vein thrombosis or superficial thrombophlebitis bilaterally, no incompetence of the greater small saphenous veins bilaterally, enlarged lymph nodes in the groin bilaterally. As seen by Dr. Gilda Crease on the same day. Dr. Gilda Crease recommended that he should continue wearing graduated compression stockings of class I which is 20-30 mmHg pressure on a daily basis and a prescription was given. He was to be reassessed in 6 months and the question of getting him lymphedema pumps were also discussed. 10/16/2014. Vascular note reviewed in detail. I have been able to review his notes from Dr. Gilda Crease on 08/17/2014. 1.The venous duplex study done on 08/17/2014 showed that all veins visualized showed no evidence of DVT. 2. no incompetence of the greater small saphenous veins bilaterally. Maurice Sanders, Maurice Sanders ([id number]) The plan was that he had no surgery or intervention needed at this point. The long discussion had showed that the patient had chronic skin changes accompanied by venous  insufficiency and at this stage he would benefit from wearing graduated compression stockings of the 20-30 mmHg on a daily basis and review would be done in his office back again in 6 months. At that time the patient could have a lymph pump. except for the new abrasion on the right lower extremity just below his tibial tuberosity he is doing fine and his ulcers actually look much better. 10/23/14 -- Culture report on Maurice Sanders --- His wound culture has grown Citrobacter koseri and Enterococcus faecalis. Final report is back but so far sensitive to Bactrim and ampicillin and ciprofloxacin. Bactrim DS was called in for 14 days. 11/13/2014 last week we changed his Unna boot on Monday and Thursday and this has helped him a lot. The edema has been much better the Unna boot hasn't slipped down and he has not had any skin abrasions. 11/20/2014 -- he saw the vascular surgeon Dr. Gilda Crease this morning and he was pleased with his progress and has said he would be ordering him some lymphedema pumps. Other than that the patient is coming to change his Unna's boots twice a week and he is doing well with this. 11/27/2014 -- over Saturday and Sunday he started having more swelling in his leg and some pain and I believe his own Unna's boot caused him some discomfort and excoriation of skin. He does admit he took some more juices orally but he has continued his diuretic. 12/11/2014 -- he has completed his course of antibiotics and has had no fresh issues. He has had no pain no swelling and is managing well with his blood sugars and diuretics. 12/14/2014 between Monday and today he has had some dietary indiscretions and had some [place] food which may have made him retain a lot of fluid. His edema has increased markedly and a lot of more excoriation. 12/25/2014 -- This week he has really done well and his edema is under control and these had no pain or problems. His leg is feeling good. 01/04/2015 --  He has  done great along the way and he has managed to keep his wrap for almost her entire week. No issues or problems. Electronic Signature(s) Signed: 01/18/2015 1:17:45 PM By: Evlyn Kanner MD, FACS Entered By: Evlyn Kanner on 01/18/2015 08:42:29 Maurice Sanders ([id number]) -------------------------------------------------------------------------------- Physical Exam Details Patient Name: Maurice Sanders Date of Service: 01/18/2015 8:00 AM Medical Record Number: [id number] Patient Account Number: [id number] Date of Birth/Sex: [birth year] (68 y.o. Male) Treating RN: Primary Care Physician: Beverely Risen Other Clinician: Referring Physician: Beverely Risen Treating Physician/Extender: Rudene Re in Treatment: 27 Constitutional . Pulse regular. Respirations normal and unlabored. Afebrile. . Eyes Nonicteric. Reactive to light. Ears, Nose, Mouth, and Throat Lips, teeth, and gums WNL.Marland Kitchen Moist mucosa without lesions . Neck supple and nontender. No palpable supraclavicular or cervical adenopathy. Normal sized without goiter. Respiratory WNL. No retractions.. Cardiovascular Pedal Pulses WNL. No clubbing, cyanosis or edema. Musculoskeletal Adexa without tenderness or enlargement.. Digits and nails w/o clubbing, cyanosis, infection, petechiae, ischemia, or inflammatory conditions.. Integumentary (Hair, Skin) the right lower extremity ulceration has been covered with some slough and he has a lot of dry skin around it.. No crepitus or fluctuance. No peri-wound warmth or erythema. No masses.. Electronic Signature(s) Signed: 01/18/2015 1:17:45 PM By: Evlyn Kanner MD, FACS Entered By: Evlyn Kanner on 01/18/2015 08:43:08 Maurice Sanders ([id number]) -------------------------------------------------------------------------------- Physician Orders Details Patient Name: Maurice Sanders Date of Service: 01/18/2015 8:00 AM Medical Record Number: [id number] Patient Account Number: [id number] Date of  Birth/Sex: 1945/12/11 (68 y.o. Male) Treating RN: Clover Mealy, RN, BSN, Spencer Sink Primary Care Physician: Beverely Risen Other Clinician: Referring Physician: Beverely Risen Treating Physician/Extender: Rudene Re in Treatment: 62 Verbal / Phone Orders: Yes Clinician: Afful, RN, BSN, Rita Read Back and Verified: Yes Diagnosis Coding Wound Cleansing Wound #2 Right,Proximal,Medial Malleolus o May shower with protection. Anesthetic Wound #2 Right,Proximal,Medial Malleolus o Topical Lidocaine 4% cream applied to wound bed prior to debridement Skin Barriers/Peri-Wound Care Wound #2 Right,Proximal,Medial Malleolus o Barrier cream o Moisturizing lotion Primary Wound Dressing Wound #2 Right,Proximal,Medial Malleolus o Prisma Ag Secondary Dressing Wound #2 Right,Proximal,Medial Malleolus o Drawtex Dressing Change Frequency Wound #2 Right,Proximal,Medial Malleolus o Dressing is to be changed Monday and Thursday. Follow-up Appointments Wound #2 Right,Proximal,Medial Malleolus o Return Appointment in 1 week. Edema Control Wound #2 Right,Proximal,Medial Malleolus o 4-Layer Compression System - Right Lower Extremity LYNDALL, WINDT ([id number]) Electronic Signature(s) Signed: 01/18/2015 1:17:45 PM By: Evlyn Kanner MD, FACS Signed: 01/18/2015 4:04:56 PM By: Elpidio Eric BSN, RN Entered By: Elpidio Eric on 01/18/2015 08:30:40 Maurice Sanders ([id number]) -------------------------------------------------------------------------------- Problem List Details Patient Name: Maurice Sanders Date of Service: 01/18/2015 8:00 AM Medical Record Number: [id number] Patient Account Number: [id number] Date of Birth/Sex: 1946/07/27 (68 y.o. 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 Male) Treating RN: Primary Care Physician: Beverely Risen Other Clinician: Referring Physician: Beverely Risen Treating Physician/Extender: Evlyn Kanner  Weeks in Treatment: 27 Subjective Chief Complaint Information obtained from Patient Rudell has been coming for dressing changes twice a week, on Monday and Thursday. Over all he is doing fine he has his blood sugar under control and is managing his diuretics well. after changing over from calamine to zinc oxide he said his own Unna's boot still gave him a bit of itching and problems and he had significant swelling which made the Unna's boot fairly tight. History of Present Illness (HPI) The following HPI elements were documented for the patient's wound: Location: right lower extremity Quality: Patient reports experiencing a dull pain to affected area(s). Severity: patient denies any severe pain. Duration: he has had this wound for a few weeks now. Timing: Pain in wound is Intermittent (comes and goes Context: the patient has a long history of venous insufficiency. He has been very compliant with wearing his compression stockings. Modifying Factors: the patient normally wears compression stockings. Recently he had placed a fair amount of  Vaseline which cause skin irritation. Associated Signs and Symptoms: Patient denies any fevers. He has had a moderate amount of drainage. The patient is 69 year-old male with a history of diabetes who presents with right lower extremity wound. He first presented to our clinic on 07/10/2014. He does have a history of venous insufficiency in the past. He has been very compliant with wearing his compression stockings. He recently Vaseline on his legs which caused skin irritation and breakdown. The patient returns for follow-up today. he denies any fevers at home. However due to the fact that he had stopped his diuretic he has had a lot of edema of the lower extremity. He also has a lot of scaly skin on his right lower extremity. 10/02/14 -- he has resumed his diuretics on a regular basis and has been walking around adequately and has overall improved. No pain or no discharge from the wounds. 10/09/14 -- the patient has been having some pain at night while sleeping and was requesting pain medications. He also says that due to taking his diuretics he's been getting some cramps and I have asked him to please see his PCP regarding both these problems. Also awaiting insurance clearance regarding his Apligraf. addendum: I have now got a clear copy of his vascular workup studies and have reviewed the entire details dated encounter 08/17/2014. the lower extremity venous duplex study done on 08/17/2014 revealed no evidence of deep vein thrombosis or superficial thrombophlebitis bilaterally, no incompetence of the greater small saphenous veins bilaterally, enlarged lymph nodes in the groin bilaterally. Maurice Sanders, Maurice Sanders ([id number]) As seen by Dr. Gilda Crease on the same day. Dr. Gilda Crease recommended that he should continue wearing graduated compression stockings of class I which is 20-30 mmHg pressure on a daily basis and a prescription was given. He was to be reassessed in 6 months and the question of getting him  lymphedema pumps were also discussed. 10/16/2014. Vascular note reviewed in detail. I have been able to review his notes from Dr. Gilda Crease on 08/17/2014. 1.The venous duplex study done on 08/17/2014 showed that all veins visualized showed no evidence of DVT. 2. no incompetence of the greater small saphenous veins bilaterally. The plan was that he had no surgery or intervention needed at this point. The long discussion had showed that the patient had chronic skin changes accompanied by venous insufficiency and at this stage he would benefit from wearing graduated compression stockings of the 20-30 mmHg on a daily basis and review would be done in  his office back again in 6 months. At that time the patient could have a lymph pump. except for the new abrasion on the right lower extremity just below his tibial tuberosity he is doing fine and his ulcers actually look much better. 10/23/14 -- Culture report on Maurice Sanders --- His wound culture has grown Citrobacter koseri and Enterococcus faecalis. Final report is back but so far sensitive to Bactrim and ampicillin and ciprofloxacin. Bactrim DS was called in for 14 days. 11/13/2014 last week we changed his Unna boot on Monday and Thursday and this has helped him a lot. The edema has been much better the Unna boot hasn't slipped down and he has not had any skin abrasions. 11/20/2014 -- he saw the vascular surgeon Dr. Gilda Crease this morning and he was pleased with his progress and has said he would be ordering him some lymphedema pumps. Other than that the patient is coming to change his Unna's boots twice a week and he is doing well with this. 11/27/2014 -- over Saturday and Sunday he started having more swelling in his leg and some pain and I believe his own Unna's boot caused him some discomfort and excoriation of skin. He does admit he took some more juices orally but he has continued his diuretic. 12/11/2014 -- he has completed his course of  antibiotics and has had no fresh issues. He has had no pain no swelling and is managing well with his blood sugars and diuretics. 12/14/2014 between Monday and today he has had some dietary indiscretions and had some [place] food which may have made him retain a lot of fluid. His edema has increased markedly and a lot of more excoriation. 12/25/2014 -- This week he has really done well and his edema is under control and these had no pain or problems. His leg is feeling good. 01/04/2015 -- He has done great along the way and he has managed to keep his wrap for almost her entire week. No issues or problems. Maurice Sanders, Maurice Sanders ([id number]) Objective Constitutional Pulse regular. Respirations normal and unlabored. Afebrile. Vitals Time Taken: 8:11 AM, Height: 71 in, Weight: 291.7 lbs, BMI: 40.7, Temperature: 97.9 F, Pulse: 64 bpm, Respiratory Rate: 18 breaths/min, Blood Pressure: 161/57 mmHg. Eyes Nonicteric. Reactive to light. Ears, Nose, Mouth, and Throat Lips, teeth, and gums WNL.Marland Kitchen Moist mucosa without lesions . Neck supple and nontender. No palpable supraclavicular or cervical adenopathy. Normal sized without goiter. Respiratory WNL. No retractions.. Cardiovascular Pedal Pulses WNL. No clubbing, cyanosis or edema. Musculoskeletal Adexa without tenderness or enlargement.. Digits and nails w/o clubbing, cyanosis, infection, petechiae, ischemia, or inflammatory conditions.. Integumentary (Hair, Skin) the right lower extremity ulceration has been covered with some slough and he has a lot of dry skin around it.. No crepitus or fluctuance. No peri-wound warmth or erythema. No masses.. Wound #2 status is Open. Original cause of wound was Gradually Appeared. The wound is located on the Right,Proximal,Medial Malleolus. The wound measures 3.4cm length x 3cm width x 0.2cm depth; 8.011cm^2 area and 1.602cm^3 volume. The wound is limited to skin breakdown. There is no tunneling or undermining  noted. There is a medium amount of serosanguineous drainage noted. The wound margin is distinct with the outline attached to the wound base. There is medium (34-66%) red, pink granulation within the wound bed. There is a medium (34-66%) amount of necrotic tissue within the wound bed including Adherent Slough. The periwound skin appearance exhibited: Scarring, Dry/Scaly, Moist, Hemosiderin Staining. The periwound skin appearance did not exhibit: Callus,  Crepitus, Excoriation, Fluctuance, Friable, Induration, Localized Edema, Rash, Maceration, Atrophie Blanche, Cyanosis, Ecchymosis, Mottled, Pallor, Rubor, Erythema. Periwound temperature was noted as No Abnormality. 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 We are trying to get it through his insurance and hopefully will have it by next week. We will continue with the Prisma AG and the 4-layer compression. Procedures Wound #2 Wound #2 is a Venous Leg Ulcer located on the Right,Proximal,Medial Malleolus . There was a Skin/Subcutaneous Tissue Debridement ([id number]) debridement with total area of 10.2 sq cm performed by Tristan Schroeder., MD. with the following instrument(s): Curette to remove Non-Viable tissue/material including Exudate, Fibrin/Slough, and Subcutaneous after achieving pain control using Lidocaine 4% Topical Solution. A time out was conducted prior to the start of the procedure. A Minimum amount of bleeding was controlled with Pressure. The procedure was tolerated well with a pain level of 0 throughout and a pain level of 0 following the procedure. Post Debridement Measurements: 3.4cm length x 3cm width x 0.2cm  depth; 1.602cm^3 volume. Plan Wound Cleansing: Wound #2 Right,Proximal,Medial Malleolus: May shower with protection. Anesthetic: Wound #2 Right,Proximal,Medial Malleolus: Topical Lidocaine 4% cream applied to wound bed prior to debridement Skin Barriers/Peri-Wound Care: Wound #2 Right,Proximal,Medial Malleolus: Barrier cream Moisturizing lotion Maurice Sanders, Maurice Sanders ([id number]) Primary Wound Dressing: Wound #2 Right,Proximal,Medial Malleolus: Prisma Ag Secondary Dressing: Wound #2 Right,Proximal,Medial Malleolus: Drawtex Dressing Change Frequency: Wound #2 Right,Proximal,Medial Malleolus: Dressing is to be changed Monday and Thursday. Follow-up Appointments: Wound #2 Right,Proximal,Medial Malleolus: Return Appointment in 1 week. Edema Control: Wound #2 Right,Proximal,Medial Malleolus: 4-Layer Compression System - Right Lower Extremity He has done very well over the last couple of weeks and is now ready for a Apligraf. We are trying to get it through his insurance and hopefully will have it by next week. We will continue with the Prisma AG and the 4-layer compression. Electronic Signature(s) Signed: 01/18/2015 1:17:45 PM By: Evlyn Kanner MD, FACS Entered By: Evlyn Kanner on 01/18/2015 08:43:53 Maurice Sanders ([id number]) -------------------------------------------------------------------------------- SuperBill Details Patient Name: Maurice Sanders Date of Service: 01/18/2015 Medical Record Number: [id number] Patient Account Number: [id number] Date of Birth/Sex: 03/06/1946 (69 y.o. 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 I83.013 Varicose veins of right lower  extremity with ulcer of ankle Facility Procedures CPT4 Code Description: [id number] 11042 - DEB SUBQ TISSUE 20 SQ CM/< ICD-10 Description Diagnosis I87.311 Chronic venous hypertension (idiopathic) with ulcer I83.013 Varicose veins of right lower extremity with ulcer Modifier: of right lowe of ankle Quantity: 1 r extremity Physician Procedures CPT4 Code Description: [id number] 11042 - WC PHYS SUBQ TISS 20 SQ CM ICD-10 Description Diagnosis I87.311 Chronic venous hypertension (idiopathic) with ulcer I83.013 Varicose veins of right lower extremity with ulcer Modifier: of right lower of ankle Quantity: 1 extremity Electronic Signature(s) Signed: 01/18/2015 1:17:45 PM By: Evlyn Kanner MD, FACS Entered By: Evlyn Kanner on 01/18/2015 08:44:41

## 2015-01-18 NOTE — Progress Notes (Signed)
Maurice Sanders, Maurice Sanders ([id number]) Visit Report for 01/18/2015 Arrival Information Details Patient Name: Maurice, Sanders Date of Service: 01/18/2015 8:00 AM Medical Record Number: [id number] Patient Account Number: [id number] Date of Birth/Sex: November 22, 1945 (69 y.o. Male) Treating RN: Afful, RN, BSN, [place] Sink Primary Care Physician: Beverely Risen Other Clinician: Referring Physician: Beverely Risen Treating Physician/Extender: Rudene Re in Treatment: 47 Visit Information History Since Last Visit Any new allergies or adverse reactions: No Patient Arrived: Ambulatory Had a fall or experienced change in No Arrival Time: 08:11 activities of daily living that may affect Accompanied By: self risk of falls: Transfer Assistance: None Signs or symptoms of abuse/neglect since last No Patient Identification Verified: Yes visito Secondary Verification Process Yes Hospitalized since last visit: No Completed: Has Dressing in Place as Prescribed: Yes Patient Requires Transmission- No Has Compression in Place as Prescribed: Yes Based Precautions: Pain Present Now: No Patient Has Alerts: Yes Patient Alerts: Patient on Blood Thinner DM II ABI L 1.06; R 1.13 Electronic Signature(s) Signed: 01/18/2015 4:04:56 PM By: Elpidio Eric BSN, RN Entered By: Elpidio Eric on 01/18/2015 08:11:24 Maurice Sanders ([id number]) -------------------------------------------------------------------------------- Encounter Discharge Information Details Patient Name: Maurice Sanders Date of Service: 01/18/2015 8:00 AM Medical Record Number: [id number] Patient Account Number: [id number] Date of Birth/Sex: 07-27-1946 (69 y.o. Male) Treating RN: Afful, RN, BSN, [place] Sink Primary Care Physician: Beverely Risen Other Clinician: Referring Physician: Beverely Risen Treating Physician/Extender: Rudene Re in Treatment: 45 Encounter Discharge Information Items Discharge Pain Level: 0 Discharge Condition: Stable Ambulatory Status:  Ambulatory Discharge Destination: Home Transportation: Private Auto Accompanied By: self Schedule Follow-up Appointment: No Medication Reconciliation completed No and provided to Patient/Care Maurice Sanders: Patient Clinical Summary of Care: Declined Electronic Signature(s) Signed: 01/18/2015 8:48:12 AM By: Gwenlyn Perking Entered By: Gwenlyn Perking on 01/18/2015 08:48:12 Maurice Sanders ([id number]) -------------------------------------------------------------------------------- Lower Extremity Assessment Details Patient Name: Maurice Sanders Date of Service: 01/18/2015 8:00 AM Medical Record Number: [id number] Patient Account Number: [id number] Date of Birth/Sex: 22-Dec-1945 (68 y.o. Male) Treating RN: Afful, RN, BSN, [place] Sink Primary Care Physician: Beverely Risen Other Clinician: Referring Physician: Beverely Risen Treating Physician/Extender: Rudene Re in Treatment: 27 Edema Assessment Assessed: [Left: No] [Right: No] E[Left: dema] [Right: :] Calf Left: Right: Point of Measurement: 33 cm From Medial Instep cm 38 cm Ankle Left: Right: Point of Measurement: 11 cm From Medial Instep cm 24.2 cm Vascular Assessment Claudication: Claudication Assessment [Right:None] Pulses: Posterior Tibial Dorsalis Pedis Palpable: [Right:Yes] Extremity colors, hair growth, and conditions: Extremity Color: [Right:Hyperpigmented] Hair Growth on Extremity: [Right:No] Temperature of Extremity: [Right:Warm] Capillary Refill: [Right:< 3 seconds] Dependent Rubor: [Right:No] Blanched when Elevated: [Right:No] Lipodermatosclerosis: [Right:No] Toe Nail Assessment Left: Right: Thick: Yes Discolored: Yes Deformed: No Improper Length and Hygiene: No Maurice, Sanders ([id number]) Electronic Signature(s) Signed: 01/18/2015 4:04:56 PM By: Elpidio Eric BSN, RN Entered By: Elpidio Eric on 01/18/2015 08:16:46 Maurice Sanders  ([id number]) -------------------------------------------------------------------------------- Multi Wound Chart Details Patient Name: Maurice Sanders Date of Service: 01/18/2015 8:00 AM Medical Record Number: [id number] Patient Account Number: [id number] Date of Birth/Sex: 15-Feb-1946 (69 y.o. Male) Treating RN: Clover Mealy, RN, BSN, [place] Sink Primary Care Physician: Beverely Risen Other Clinician: Referring Physician: Beverely Risen Treating Physician/Extender: Rudene Re in Treatment: 27 Vital Signs Height(in): 71 Pulse(bpm): 64 Weight(lbs): 291.7 Blood Pressure 161/57 (mmHg): Body Mass Index(BMI): 41 Temperature(F): 97.9 Respiratory Rate 18 (breaths/min): Photos: [2:No Photos] [N/A:N/A] Wound Location: [2:Right Malleolus - Medial, Proximal] [N/A:N/A] Wounding Event: [2:Gradually Appeared] [N/A:N/A] Primary Etiology: [2:Venous Leg Ulcer] [N/A:N/A] Comorbid History: [2:Sleep Apnea, Congestive Heart Failure, Deep Vein Thrombosis, Hypertension, Type II Diabetes, Gout] [  N/A:N/A] Date Acquired: [2:07/31/2014] [N/A:N/A] Weeks of Treatment: [2:24] [N/A:N/A] Wound Status: [2:Open] [N/A:N/A] Measurements L x W x D 3.4x3x0.2 [N/A:N/A] (cm) Area (cm) : [2:8.011] [N/A:N/A] Volume (cm) : [2:1.602] [N/A:N/A] % Reduction in Area: [2:-1033.10%] [N/A:N/A] % Reduction in Volume: -2156.30% [N/A:N/A] Classification: [2:Full Thickness Without Exposed Support Structures] [N/A:N/A] HBO Classification: [2:Grade 1] [N/A:N/A] Exudate Amount: [2:Medium] [N/A:N/A] Exudate Type: [2:Serosanguineous] [N/A:N/A] Exudate Color: [2:red, brown] [N/A:N/A] Wound Margin: [2:Distinct, outline attached] [N/A:N/A] Granulation Amount: [2:Medium (34-66%)] [N/A:N/A] Granulation Quality: [2:Red, Pink] [N/A:N/A] Necrotic Amount: [2:Medium (34-66%)] [N/A:N/A] Exposed Structures: Fascia: No N/A N/A Fat: No Tendon: No Muscle: No Joint: No Bone: No Limited to Skin Breakdown Epithelialization: Medium (34-66%) N/A  N/A Debridement: Debridement ([id number]- N/A N/A 11047) Time-Out Taken: Yes N/A N/A Pain Control: Lidocaine 4% Topical N/A N/A Solution Tissue Debrided: Fibrin/Slough, Exudates, N/A N/A Subcutaneous Level: Skin/Subcutaneous N/A N/A Tissue Debridement Area (sq 10.2 N/A N/A cm): Instrument: Curette N/A N/A Bleeding: Minimum N/A N/A Hemostasis Achieved: Pressure N/A N/A Procedural Pain: 0 N/A N/A Post Procedural Pain: 0 N/A N/A Debridement Treatment Procedure was tolerated N/A N/A Response: well Post Debridement 3.4x3x0.2 N/A N/A Measurements L x W x D (cm) Post Debridement 1.602 N/A N/A Volume: (cm) Periwound Skin Texture: Scarring: Yes N/A N/A Edema: No Excoriation: No Induration: No Callus: No Crepitus: No Fluctuance: No Friable: No Rash: No Periwound Skin Moist: Yes N/A N/A Moisture: Dry/Scaly: Yes Maceration: No Periwound Skin Color: Hemosiderin Staining: Yes N/A N/A Atrophie Blanche: No Cyanosis: No Ecchymosis: No Erythema: No Mottled: No Condron, Efren ([id number]) Pallor: No Rubor: No Temperature: No Abnormality N/A N/A Tenderness on No N/A N/A Palpation: Wound Preparation: Ulcer Cleansing: N/A N/A Rinsed/Irrigated with Saline, Other: water and soap Topical Anesthetic Applied: Other: lidocaine 4% Procedures Performed: Debridement N/A N/A Treatment Notes Electronic Signature(s) Signed: 01/18/2015 4:04:56 PM By: Elpidio Eric BSN, RN Entered By: Elpidio Eric on 01/18/2015 08:30:22 Maurice Sanders ([id number]) -------------------------------------------------------------------------------- Multi-Disciplinary Care Plan Details Patient Name: Maurice Sanders Date of Service: 01/18/2015 8:00 AM Medical Record Number: [id number] Patient Account Number: [id number] Date of Birth/Sex: 1946-02-09 (68 y.o. Male) Treating RN: Afful, RN, BSN, [place] Sink Primary Care Physician: Beverely Risen Other Clinician: Referring Physician: Beverely Risen Treating Physician/Extender: Rudene Re in Treatment: 41 Active Inactive Abuse / Safety / Falls / Self Care Management Nursing Diagnoses: Potential for falls Goals: Patient will remain injury free Date Initiated: 07/10/2014 Goal Status: Active Interventions: Assess fall risk on admission and as needed Notes: Nutrition Nursing Diagnoses: Potential for alteratiion in Nutrition/Potential for imbalanced nutrition Goals: Patient/caregiver agrees to and verbalizes understanding of need to use nutritional supplements and/or vitamins as prescribed Date Initiated: 07/10/2014 Goal Status: Active Interventions: Assess patient nutrition upon admission and as needed per policy Notes: Orientation to the Wound Care Program Nursing Diagnoses: Knowledge deficit related to the wound healing center program Goals: Patient/caregiver will verbalize understanding of the Wound Healing Center Program [place], [place] ([id number]) Date Initiated: 07/10/2014 Goal Status: Active Interventions: Provide education on orientation to the wound center Notes: Wound/Skin Impairment Nursing Diagnoses: Impaired tissue integrity Goals: Ulcer/skin breakdown will have a volume reduction of 30% by week 4 Date Initiated: 07/10/2014 Goal Status: Active Interventions: Assess ulceration(s) every visit Notes: Electronic Signature(s) Signed: 01/18/2015 4:04:56 PM By: Elpidio Eric BSN, RN Entered By: Elpidio Eric on 01/18/2015 08:30:15 Maurice Sanders ([id number]) -------------------------------------------------------------------------------- Pain Assessment Details Patient Name: Maurice Sanders Date of Service: 01/18/2015 8:00 AM Medical Record Number: [id number] Patient Account Number: [id number] Date of Birth/Sex: 07-14-1946 (68 y.o. Male) Treating RN: Afful, RN, BSN,  [place] Sink Primary Care Physician: Beverely Risen Other Clinician: Referring Physician: Beverely Risen Treating Physician/Extender: Rudene Re in Treatment: 50 Active  Problems Location of Pain Severity and Description of Pain Patient Has Paino No Site Locations Pain Management and Medication Current Pain Management: Electronic Signature(s) Signed: 01/18/2015 4:04:56 PM By: Elpidio Eric BSN, RN Entered By: Elpidio Eric on 01/18/2015 08:11:31 Maurice Sanders ([id number]) -------------------------------------------------------------------------------- Patient/Caregiver Education Details Patient Name: Maurice Sanders Date of Service: 01/18/2015 8:00 AM Medical Record Number: [id number] Patient Account Number: [id number] Date of Birth/Gender: Dec 24, 1945 (68 y.o. Male) Treating RN: Clover Mealy, RN, BSN, [place] Sink Primary Care Physician: Beverely Risen Other Clinician: Referring Physician: Beverely Risen Treating Physician/Extender: Rudene Re in Treatment: 5 Education Assessment Education Provided To: Patient Education Topics Provided Basic Hygiene: Methods: Explain/Verbal Responses: State content correctly Venous: Methods: Explain/Verbal Responses: State content correctly Electronic Signature(s) Signed: 01/18/2015 4:04:56 PM By: Elpidio Eric BSN, RN Entered By: Elpidio Eric on 01/18/2015 08:32:39 Maurice Sanders ([id number]) -------------------------------------------------------------------------------- Wound Assessment Details Patient Name: Maurice Sanders Date of Service: 01/18/2015 8:00 AM Medical Record Number: [id number] Patient Account Number: [id number] Date of Birth/Sex: 1945-11-17 (68 y.o. Male) Treating RN: Afful, RN, BSN, [place] Primary Care Physician: Beverely Risen Other Clinician: Referring Physician: Beverely Risen Treating Physician/Extender: Rudene Re in Treatment: 27 Wound Status Wound Number: 2 Primary Venous Leg Ulcer Etiology: Wound Location: Right Malleolus - Medial, Proximal Wound Open Status: Wounding Event: Gradually Appeared Comorbid Sleep Apnea, Congestive Heart Failure, Date Acquired: 07/31/2014 History: Deep Vein  Thrombosis, Hypertension, Weeks Of Treatment: 24 Type II Diabetes, Gout Clustered Wound: No Photos Photo Uploaded By: Elpidio Eric on 01/18/2015 16:03:31 Wound Measurements Length: (cm) 3.4 Width: (cm) 3 Depth: (cm) 0.2 Area: (cm) 8.011 Volume: (cm) 1.602 % Reduction in Area: -1033.1% % Reduction in Volume: -2156.3% Epithelialization: Medium (34-66%) Tunneling: No Undermining: No Wound Description Full Thickness Without Exposed Foul Odor A Classification: Support Structures Diabetic Severity Grade 1 (Wagner): Wound Margin: Distinct, outline attached Exudate Amount: Medium Exudate Type: Serosanguineous Exudate Color: red, brown fter Cleansing: No Wound Bed Granulation Amount: Medium (34-66%) Exposed Structure Linehan, [place] ([id number]) Granulation Quality: Red, Pink Fascia Exposed: No Necrotic Amount: Medium (34-66%) Fat Layer Exposed: No Necrotic Quality: Adherent Slough Tendon Exposed: No Muscle Exposed: No Joint Exposed: No Bone Exposed: No Limited to Skin Breakdown Periwound Skin Texture Texture Color No Abnormalities Noted: No No Abnormalities Noted: No Callus: No Atrophie Blanche: No Crepitus: No Cyanosis: No Excoriation: No Ecchymosis: No Fluctuance: No Erythema: No Friable: No Hemosiderin Staining: Yes Induration: No Mottled: No Localized Edema: No Pallor: No Rash: No Rubor: No Scarring: Yes Temperature / Pain Moisture Temperature: No Abnormality No Abnormalities Noted: No Dry / Scaly: Yes Maceration: No Moist: Yes Wound Preparation Ulcer Cleansing: Rinsed/Irrigated with Saline, Other: water and soap, Topical Anesthetic Applied: Other: lidocaine 4%, Treatment Notes Wound #2 (Right, Proximal, Medial Malleolus) 1. Cleansed with: Clean wound with Normal Saline 2. Anesthetic Topical Lidocaine 4% cream to wound bed prior to debridement 3. Peri-wound Care: Barrier cream Moisturizing lotion 4. Dressing Applied: Prisma Ag 5.  Secondary Dressing Applied ABD Pad 7. Secured with 4-Layer Compression System - Right Lower Extremity Notes TRESON, LAURA ([id number]) contact layer applied on non wounded areas drawtex Electronic Signature(s) Signed: 01/18/2015 4:04:56 PM By: Elpidio Eric BSN, RN Entered By: Elpidio Eric on 01/18/2015 08:21:26 Maurice Sanders ([id number]) -------------------------------------------------------------------------------- Vitals Details Patient Name: Maurice Sanders Date of Service: 01/18/2015 8:00 AM Medical Record Number: [id number] Patient Account Number: [id number] Date of Birth/Sex: 1946/07/20 (69 y.o. Male) Treating RN: Afful, RN,  BSN, [place] Sink Primary Care Physician: Beverely Risen Other Clinician: Referring Physician: Beverely Risen Treating Physician/Extender: Rudene Re in Treatment: 84 Vital Signs Time Taken: 08:11 Temperature (F): 97.9 Height (in): 71 Pulse (bpm): 64 Weight (lbs): 291.7 Respiratory Rate (breaths/min): 18 Body Mass Index (BMI): 40.7 Blood Pressure (mmHg): 161/57 Reference Range: 80 - 120 mg / dl Electronic Signature(s) Signed: 01/18/2015 4:04:56 PM By: Elpidio Eric BSN, RN Entered By: Elpidio Eric on 01/18/2015 08:14:41

## 2015-01-23 DIAGNOSIS — I2583 Coronary atherosclerosis due to lipid rich plaque: Secondary | ICD-10-CM | POA: Diagnosis not present

## 2015-01-23 DIAGNOSIS — I6523 Occlusion and stenosis of bilateral carotid arteries: Secondary | ICD-10-CM | POA: Diagnosis not present

## 2015-01-23 DIAGNOSIS — R011 Cardiac murmur, unspecified: Secondary | ICD-10-CM | POA: Diagnosis not present

## 2015-01-23 DIAGNOSIS — Z0001 Encounter for general adult medical examination with abnormal findings: Secondary | ICD-10-CM | POA: Diagnosis not present

## 2015-01-23 DIAGNOSIS — I10 Essential (primary) hypertension: Secondary | ICD-10-CM | POA: Diagnosis not present

## 2015-01-23 DIAGNOSIS — E1165 Type 2 diabetes mellitus with hyperglycemia: Secondary | ICD-10-CM | POA: Diagnosis not present

## 2015-01-23 DIAGNOSIS — R300 Dysuria: Secondary | ICD-10-CM | POA: Diagnosis not present

## 2015-01-25 ENCOUNTER — Encounter: Payer: Medicare Other | Admitting: Surgery

## 2015-01-25 DIAGNOSIS — L97319 Non-pressure chronic ulcer of right ankle with unspecified severity: Secondary | ICD-10-CM | POA: Diagnosis not present

## 2015-01-25 DIAGNOSIS — I87311 Chronic venous hypertension (idiopathic) with ulcer of right lower extremity: Secondary | ICD-10-CM | POA: Diagnosis not present

## 2015-01-25 DIAGNOSIS — E11620 Type 2 diabetes mellitus with diabetic dermatitis: Secondary | ICD-10-CM | POA: Diagnosis not present

## 2015-01-25 DIAGNOSIS — I83013 Varicose veins of right lower extremity with ulcer of ankle: Secondary | ICD-10-CM | POA: Diagnosis not present

## 2015-01-25 NOTE — Progress Notes (Signed)
DEVEREAUX, GRAYSON ([id number]) Visit Report for 01/25/2015 Chief Complaint Document Details Patient Name: Maurice Sanders Date of Service: 01/25/2015 8:00 AM Medical Record Number: [id number] Patient Account Number: [id number] Date of Birth/Sex: [birth year] (69 y.o. Male) Treating RN: Primary Care Physician: Beverely Risen Other Clinician: Referring Physician: Beverely Risen Treating Physician/Extender: Rudene Re in Treatment: 72 Information Obtained from: Patient Chief Complaint Derius has been coming for dressing changes twice a week, on Monday and Thursday. Over all he is doing fine he has his blood sugar under control and is managing his diuretics well. after changing over from calamine to zinc oxide he said his own Unna's boot still gave him a bit of itching and problems and he had significant swelling which made the Unna's boot fairly tight. Electronic Signature(s) Signed: 01/25/2015 12:19:52 PM By: Evlyn Kanner MD, FACS Entered By: Evlyn Kanner on 01/25/2015 08:42:16 Maurice Sanders ([id number]) -------------------------------------------------------------------------------- Debridement Details Patient Name: Maurice Sanders Date of Service: 01/25/2015 8:00 AM Medical Record Number: [id number] Patient Account Number: [id number] Date of Birth/Sex: 01/05/1946 (68 y.o. Male) Treating RN: Primary Care Physician: Beverely Risen Other Clinician: Referring Physician: Beverely Risen Treating Physician/Extender: Rudene Re in Treatment: 28 Debridement Performed for Wound #2 Right,Proximal,Medial Malleolus Assessment: Performed By: Physician Tristan Schroeder., MD Debridement: Debridement Pre-procedure Yes Verification/Time Out Taken: Start Time: 08:36 Pain Control: Lidocaine 4% Topical Solution Level: Skin/Subcutaneous Tissue Total Area Debrided (L x 3 (cm) x 2.5 (cm) = 7.5 (cm) W): Tissue and other Viable, Non-Viable, Exudate, Fibrin/Slough, Subcutaneous material  debrided: Instrument: Curette Bleeding: Minimum Hemostasis Achieved: Pressure End Time: 08:38 Procedural Pain: 0 Post Procedural Pain: 0 Response to Treatment: Procedure was tolerated well Post Debridement Measurements of Total Wound Length: (cm) 3 Width: (cm) 2.5 Depth: (cm) 0.2 Volume: (cm) 1.178 Electronic Signature(s) Signed: 01/25/2015 12:19:52 PM By: Evlyn Kanner MD, FACS Entered By: Evlyn Kanner on 01/25/2015 08:42:08 Maurice Sanders ([id number]) -------------------------------------------------------------------------------- HPI Details Patient Name: Maurice Sanders Date of Service: 01/25/2015 8:00 AM Medical Record Number: [id number] Patient Account Number: [id number] Date of Birth/Sex: 22-Nov-1945 (69 y.o. Male) Treating RN: Primary Care Physician: Beverely Risen Other Clinician: Referring Physician: Beverely Risen Treating Physician/Extender: Rudene Re in Treatment: 28 History of Present Illness Location: right lower extremity Quality: Patient reports experiencing a dull pain to affected area(s). Severity: patient denies any severe pain. Duration: he has had this wound for a few weeks now. Timing: Pain in wound is Intermittent (comes and goes Context: the patient has a long history of venous insufficiency. He has been very compliant with wearing his compression stockings. Modifying Factors: the patient normally wears compression stockings. Recently he had placed a fair amount of Vaseline which cause skin irritation. Associated Signs and Symptoms: Patient denies any fevers. He has had a moderate amount of drainage. HPI Description: The patient is 69 year-old male with a history of diabetes who presents with right lower extremity wound. He first presented to our clinic on 07/10/2014. He does have a history of venous insufficiency in the past. He has been very compliant with wearing his compression stockings. He recently Vaseline on his legs which caused skin  irritation and breakdown. The patient returns for follow-up today. he denies any fevers at home. However due to the fact that he had stopped his diuretic he has had a lot of edema of the lower extremity. He also has a lot of scaly skin on his right lower extremity. 10/02/14 -- he has resumed his diuretics on a regular basis and has been walking around adequately and  has overall improved. No pain or no discharge from the wounds. 10/09/14 -- the patient has been having some pain at night while sleeping and was requesting pain medications. He also says that due to taking his diuretics he's been getting some cramps and I have asked him to please see his PCP regarding both these problems. Also awaiting insurance clearance regarding his Apligraf. addendum: I have now got a clear copy of his vascular workup studies and have reviewed the entire details dated encounter 08/17/2014. the lower extremity venous duplex study done on 08/17/2014 revealed no evidence of deep vein thrombosis or superficial thrombophlebitis bilaterally, no incompetence of the greater small saphenous veins bilaterally, enlarged lymph nodes in the groin bilaterally. As seen by Dr. Gilda CreaseSchnier on the same day. Dr. Gilda CreaseSchnier recommended that he should continue wearing graduated compression stockings of class I which is 20-30 mmHg pressure on a daily basis and a prescription was given. He was to be reassessed in 6 months and the question of getting him lymphedema pumps were also discussed. 10/16/2014. Vascular note reviewed in detail. I have been able to review his notes from Dr. Gilda CreaseSchnier on 08/17/2014. 1.The venous duplex study done on 08/17/2014 showed that all veins visualized showed no evidence of DVT. 2. no incompetence of the greater small saphenous veins bilaterally. Maurice BayleySILER, Maurice Sanders ([id number]) The plan was that he had no surgery or intervention needed at this point. The long discussion had showed that the patient had chronic skin  changes accompanied by venous insufficiency and at this stage he would benefit from wearing graduated compression stockings of the 20-30 mmHg on a daily basis and review would be done in his office back again in 6 months. At that time the patient could have a lymph pump. except for the new abrasion on the right lower extremity just below his tibial tuberosity he is doing fine and his ulcers actually look much better. 10/23/14 -- Culture report on Maurice BayleyAnthony Mcnew --- His wound culture has grown Citrobacter koseri and Enterococcus faecalis. Final report is back but so far sensitive to Bactrim and ampicillin and ciprofloxacin. Bactrim DS was called in for 14 days. 11/13/2014 last week we changed his Unna boot on Monday and Thursday and this has helped him a lot. The edema has been much better the Unna boot hasn't slipped down and he has not had any skin abrasions. 11/20/2014 -- he saw the vascular surgeon Dr. Gilda CreaseSchnier this morning and he was pleased with his progress and has said he would be ordering him some lymphedema pumps. Other than that the patient is coming to change his Unna's boots twice a week and he is doing well with this. 11/27/2014 -- over Saturday and Sunday he started having more swelling in his leg and some pain and I believe his own Unna's boot caused him some discomfort and excoriation of skin. He does admit he took some more juices orally but he has continued his diuretic. 12/11/2014 -- he has completed his course of antibiotics and has had no fresh issues. He has had no pain no swelling and is managing well with his blood sugars and diuretics. 12/14/2014 between Monday and today he has had some dietary indiscretions and had some [place] food which may have made him retain a lot of fluid. His edema has increased markedly and a lot of more excoriation. 12/25/2014 -- This week he has really done well and his edema is under control and these had no pain or problems. His leg is  feeling  good. 01/04/2015 -- He has done great along the way and he has managed to keep his wrap for almost her entire week. No issues or problems. Electronic Signature(s) Signed: 01/25/2015 12:19:52 PM By: Evlyn Kanner MD, FACS Entered By: Evlyn Kanner on 01/25/2015 08:42:25 Maurice Sanders ([id number]) -------------------------------------------------------------------------------- Physical Exam Details Patient Name: Maurice Sanders Date of Service: 01/25/2015 8:00 AM Medical Record Number: [id number] Patient Account Number: [id number] Date of Birth/Sex: 1945/09/25 (68 y.o. Male) Treating RN: Primary Care Physician: Beverely Risen Other Clinician: Referring Physician: Beverely Risen Treating Physician/Extender: Rudene Re in Treatment: 28 Constitutional . Pulse regular. Respirations normal and unlabored. Afebrile. . Eyes Nonicteric. Reactive to light. Ears, Nose, Mouth, and Throat Lips, teeth, and gums WNL.Marland Kitchen Moist mucosa without lesions . Neck supple and nontender. No palpable supraclavicular or cervical adenopathy. Normal sized without goiter. Respiratory WNL. No retractions.. . Cardiovascular . Pedal Pulses WNL. No clubbing, cyanosis or edema. Musculoskeletal Adexa without tenderness or enlargement.. Digits and nails w/o clubbing, cyanosis, infection, petechiae, ischemia, or inflammatory conditions.. Integumentary (Hair, Skin) the wounds have grown much smaller on his right medial lower extremity and the surrounding skin shows good control of edema.. No crepitus or fluctuance. No peri-wound warmth or erythema. No masses.Marland Kitchen Psychiatric Judgement and insight Intact.. No evidence of depression, anxiety, or agitation.. Electronic Signature(s) Signed: 01/25/2015 12:19:52 PM By: Evlyn Kanner MD, FACS Entered By: Evlyn Kanner on 01/25/2015 08:43:07 Maurice Sanders ([id number]) -------------------------------------------------------------------------------- Physician Orders  Details Patient Name: Maurice Sanders Date of Service: 01/25/2015 8:00 AM Medical Record Number: [id number] Patient Account Number: [id number] Date of Birth/Sex: Apr 05, 1946 (68 y.o. Male) Treating RN: Clover Mealy, RN, BSN, [place] Sink Primary Care Physician: Beverely Risen Other Clinician: Referring Physician: Beverely Risen Treating Physician/Extender: Rudene Re in Treatment: 90 Verbal / Phone Orders: Yes Clinician: Afful, RN, BSN, Rita Read Back and Verified: Yes Diagnosis Coding Wound Cleansing Wound #2 Right,Proximal,Medial Malleolus o May shower with protection. Anesthetic Wound #2 Right,Proximal,Medial Malleolus o Topical Lidocaine 4% cream applied to wound bed prior to debridement Skin Barriers/Peri-Wound Care Wound #2 Right,Proximal,Medial Malleolus o Barrier cream o Moisturizing lotion Primary Wound Dressing Wound #2 Right,Proximal,Medial Malleolus o Prisma Ag Secondary Dressing Wound #2 Right,Proximal,Medial Malleolus o Dry Gauze Dressing Change Frequency Wound #2 Right,Proximal,Medial Malleolus o Change dressing every week Follow-up Appointments Wound #2 Right,Proximal,Medial Malleolus o Return Appointment in 1 week. Edema Control Wound #2 Right,Proximal,Medial Malleolus o 4-Layer Compression System - Right Lower Extremity KOU, BELLANTI ([id number]) Electronic Signature(s) Signed: 01/25/2015 8:40:51 AM By: Elpidio Eric BSN, RN Signed: 01/25/2015 12:19:52 PM By: Evlyn Kanner MD, FACS Entered By: Elpidio Eric on 01/25/2015 08:40:50 Maurice Sanders ([id number]) -------------------------------------------------------------------------------- Problem List Details Patient Name: Maurice Sanders Date of Service: 01/25/2015 8:00 AM Medical Record Number: [id number] Patient Account Number: [id number] Date of Birth/Sex: 09-May-1946 (68 y.o. 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 Male) Treating RN: Primary Care  Physician: Beverely Risen Other Clinician: Referring Physician: Beverely Risen Treating Physician/Extender: Rudene Re in Treatment: 28 Subjective Chief Complaint Information obtained from Patient Samier has been coming for dressing changes twice a week, on Monday and Thursday. Over all he is doing fine he has his blood sugar under control and is managing his diuretics well. after changing over from calamine to zinc oxide he said his own Unna's boot still gave him a bit of itching and problems and he had significant swelling which made the Unna's boot fairly tight. History of Present Illness (HPI) The following HPI elements were documented for the patient's wound: Location: right lower extremity Quality: Patient reports experiencing a dull pain to affected area(s). Severity: patient denies any severe pain. Duration: he has had this wound for a few weeks now. Timing: Pain in wound is Intermittent (comes and goes Context: the patient has a long history of venous insufficiency. He has been very compliant with  wearing his compression stockings. Modifying Factors: the patient normally wears compression stockings. Recently he had placed a fair amount of Vaseline which cause skin irritation. Associated Signs and Symptoms: Patient denies any fevers. He has had a moderate amount of drainage. The patient is 69 year-old male with a history of diabetes who presents with right lower extremity wound. He first presented to our clinic on 07/10/2014. He does have a history of venous insufficiency in the past. He has been very compliant with wearing his compression stockings. He recently Vaseline on his legs which caused skin irritation and breakdown. The patient returns for follow-up today. he denies any fevers at home. However due to the fact that he had stopped his diuretic he has had a lot of edema of the lower extremity. He also has a lot of scaly skin on his right lower extremity. 10/02/14 -- he has resumed his diuretics on a regular basis and has been walking around adequately and has overall improved. No pain or no discharge from the wounds. 10/09/14 -- the patient has been having some pain at night while sleeping and was requesting pain medications. He also says that due to taking his diuretics he's been getting some cramps and I have asked him to please see his PCP regarding both these problems. Also awaiting insurance clearance regarding his Apligraf. addendum: I have now got a clear copy of his vascular workup studies and have reviewed the entire details dated encounter 08/17/2014. the lower extremity venous duplex study done on 08/17/2014 revealed no evidence of deep vein thrombosis or superficial thrombophlebitis bilaterally, no incompetence of the greater small saphenous veins bilaterally, enlarged lymph nodes in the groin bilaterally. DARBY, SHADWICK ([id number]) As seen by Dr. Gilda Crease on the same day. Dr. Gilda Crease recommended that he should continue wearing graduated compression stockings of class I  which is 20-30 mmHg pressure on a daily basis and a prescription was given. He was to be reassessed in 6 months and the question of getting him lymphedema pumps were also discussed. 10/16/2014. Vascular note reviewed in detail. I have been able to review his notes from Dr. Gilda Crease on 08/17/2014. 1.The venous duplex study done on 08/17/2014 showed that all veins visualized showed no evidence of DVT. 2. no incompetence of the greater small saphenous veins bilaterally. The plan was that he had no surgery or intervention needed at this point. The long discussion had showed that the patient had chronic skin changes accompanied by venous insufficiency and at this stage he would benefit from wearing graduated compression stockings of  the 20-30 mmHg on a daily basis and review would be done in his office back again in 6 months. At that time the patient could have a lymph pump. except for the new abrasion on the right lower extremity just below his tibial tuberosity he is doing fine and his ulcers actually look much better. 10/23/14 -- Culture report on Maurice Sanders --- His wound culture has grown Citrobacter koseri and Enterococcus faecalis. Final report is back but so far sensitive to Bactrim and ampicillin and ciprofloxacin. Bactrim DS was called in for 14 days. 11/13/2014 last week we changed his Unna boot on Monday and Thursday and this has helped him a lot. The edema has been much better the Unna boot hasn't slipped down and he has not had any skin abrasions. 11/20/2014 -- he saw the vascular surgeon Dr. Gilda Crease this morning and he was pleased with his progress and has said he would be ordering him some lymphedema pumps. Other than that the patient is coming to change his Unna's boots twice a week and he is doing well with this. 11/27/2014 -- over Saturday and Sunday he started having more swelling in his leg and some pain and I believe his own Unna's boot caused him some discomfort and  excoriation of skin. He does admit he took some more juices orally but he has continued his diuretic. 12/11/2014 -- he has completed his course of antibiotics and has had no fresh issues. He has had no pain no swelling and is managing well with his blood sugars and diuretics. 12/14/2014 between Monday and today he has had some dietary indiscretions and had some [place] food which may have made him retain a lot of fluid. His edema has increased markedly and a lot of more excoriation. 12/25/2014 -- This week he has really done well and his edema is under control and these had no pain or problems. His leg is feeling good. 01/04/2015 -- He has done great along the way and he has managed to keep his wrap for almost her entire week. No issues or problems. CANUTO, KINGSTON ([id number]) Objective Constitutional Pulse regular. Respirations normal and unlabored. Afebrile. Vitals Time Taken: 8:22 AM, Height: 71 in, Weight: 291.7 lbs, BMI: 40.7, Temperature: 98.0 F, Pulse: 66 bpm, Respiratory Rate: 18 breaths/min, Blood Pressure: 170/75 mmHg. Eyes Nonicteric. Reactive to light. Ears, Nose, Mouth, and Throat Lips, teeth, and gums WNL.Marland Kitchen Moist mucosa without lesions . Neck supple and nontender. No palpable supraclavicular or cervical adenopathy. Normal sized without goiter. Respiratory WNL. No retractions.. Cardiovascular Pedal Pulses WNL. No clubbing, cyanosis or edema. Musculoskeletal Adexa without tenderness or enlargement.. Digits and nails w/o clubbing, cyanosis, infection, petechiae, ischemia, or inflammatory conditions.Marland Kitchen Psychiatric Judgement and insight Intact.. No evidence of depression, anxiety, or agitation.. Integumentary (Hair, Skin) the wounds have grown much smaller on his right medial lower extremity and the surrounding skin shows good control of edema.. No crepitus or fluctuance. No peri-wound warmth or erythema. No masses.. Wound #2 status is Open. Original cause of wound was  Gradually Appeared. The wound is located on the Right,Proximal,Medial Malleolus. The wound measures 3cm length x 2.5cm width x 0.2cm depth; 5.89cm^2 area and 1.178cm^3 volume. The wound is limited to skin breakdown. There is no tunneling or undermining noted. There is a medium amount of serosanguineous drainage noted. The wound margin is distinct with the outline attached to the wound base. There is medium (34-66%) red, pink granulation within the wound bed. There is a medium (34-66%) amount of necrotic tissue  within the wound bed including Adherent Slough. The periwound skin appearance exhibited: Scarring, Dry/Scaly, Moist, Hemosiderin Staining. The periwound skin appearance did not exhibit: Callus, Crepitus, Excoriation, Fluctuance, Friable, Induration, Localized Edema, Rash, Maceration, Atrophie Blanche, Cyanosis, Ecchymosis, Mottled, Pallor, Rubor, Maple, Elmus ([id number]) Erythema. Periwound temperature was noted as No Abnormality. 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 In the meanwhile we will continue with a 4-layer Profore and Prisma AG. He will come back and see as next week. Procedures Wound #2 Wound #2 is a Venous Leg Ulcer located on the Right,Proximal,Medial Malleolus . There was a Skin/Subcutaneous Tissue Debridement (40981-19147) debridement with total area of 7.5 sq cm performed by Tristan Schroeder., MD. with the following instrument(s): Curette to remove Viable and Non-Viable tissue/material including Exudate, Fibrin/Slough, and Subcutaneous after achieving pain control using Lidocaine 4% Topical Solution. A time out was conducted prior to the start of the procedure. A  Minimum amount of bleeding was controlled with Pressure. The procedure was tolerated well with a pain level of 0 throughout and a pain level of 0 following the procedure. Post Debridement Measurements: 3cm length x 2.5cm width x 0.2cm depth; 1.178cm^3 volume. Plan Wound Cleansing: Wound #2 Right,Proximal,Medial Malleolus: May shower with protection. Anesthetic: Wound #2 Right,Proximal,Medial Malleolus: PERRIN, GENS ([id number]) Topical Lidocaine 4% cream applied to wound bed prior to debridement Skin Barriers/Peri-Wound Care: Wound #2 Right,Proximal,Medial Malleolus: Barrier cream Moisturizing lotion Primary Wound Dressing: Wound #2 Right,Proximal,Medial Malleolus: Prisma Ag Secondary Dressing: Wound #2 Right,Proximal,Medial Malleolus: Dry Gauze Dressing Change Frequency: Wound #2 Right,Proximal,Medial Malleolus: Change dressing every week Follow-up Appointments: Wound #2 Right,Proximal,Medial Malleolus: Return Appointment in 1 week. Edema Control: Wound #2 Right,Proximal,Medial Malleolus: 4-Layer Compression System - Right Lower Extremity We still awaiting his Apligraf and benefits clearance and hopefully we will have it soon. In the meanwhile we will continue with a 4-layer Profore and Prisma AG. He will come back and see as next week. Electronic Signature(s) Signed: 01/25/2015 12:19:52 PM By: Evlyn Kanner MD, FACS Entered By: Evlyn Kanner on 01/25/2015 08:44:11 Maurice Sanders ([id number]) -------------------------------------------------------------------------------- SuperBill Details Patient Name: Maurice Sanders Date of Service: 01/25/2015 Medical Record Number: [id number] Patient Account Number: [id number] Date of Birth/Sex: 12/13/1945 (69 y.o. 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 ulcer of ankle Facility Procedures CPT4 Code Description: [id number] 11042 - DEB SUBQ TISSUE 20 SQ CM/< ICD-10 Description Diagnosis 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 

## 2015-01-26 NOTE — Progress Notes (Signed)
CLANCE, BAQUERO ([id number]) Visit Report for 01/25/2015 Arrival Information Details Patient Name: Maurice Sanders, Maurice Sanders Date of Service: 01/25/2015 8:00 AM Medical Record Number: [id number] Patient Account Number: [id number] Date of Birth/Sex: Jun 08, 1946 (69 y.o. Male) Treating RN: Afful, RN, BSN, [place] Sink Primary Care Physician: Beverely Risen Other Clinician: Referring Physician: Beverely Risen Treating Physician/Extender: Rudene Re in Treatment: 28 Visit Information History Since Last Visit Any new allergies or adverse reactions: No Patient Arrived: Ambulatory Had a fall or experienced change in No Arrival Time: 08:16 activities of daily living that may affect Accompanied By: self risk of falls: Transfer Assistance: None Signs or symptoms of abuse/neglect since last No Patient Identification Verified: Yes visito Secondary Verification Process Yes Has Dressing in Place as Prescribed: Yes Completed: Has Compression in Place as Prescribed: Yes Patient Requires Transmission- No Pain Present Now: No Based Precautions: Patient Has Alerts: Yes Patient Alerts: Patient on Blood Thinner DM II ABI L 1.06; R 1.13 Electronic Signature(s) Signed: 01/25/2015 5:53:15 PM By: Elpidio Eric BSN, RN Entered By: Elpidio Eric on 01/25/2015 08:20:13 Maurice Sanders ([id number]) -------------------------------------------------------------------------------- Encounter Discharge Information Details Patient Name: Maurice Sanders Date of Service: 01/25/2015 8:00 AM Medical Record Number: [id number] Patient Account Number: [id number] Date of Birth/Sex: Oct 01, 1945 (69 y.o. Male) Treating RN: Afful, RN, BSN, [place] Sink Primary Care Physician: Beverely Risen Other Clinician: Referring Physician: Beverely Risen Treating Physician/Extender: Rudene Re in Treatment: 53 Encounter Discharge Information Items Discharge Pain Level: 0 Discharge Condition: Stable Ambulatory Status: Ambulatory Admitted to Discharge  Destination: Hospital Transportation: Private Auto Accompanied By: self Schedule Follow-up Appointment: No Medication Reconciliation completed No and provided to Patient/Care Virdia Ziesmer: Clinical Summary of Care: Electronic Signature(s) Signed: 01/25/2015 5:53:15 PM By: Elpidio Eric BSN, RN Entered By: Elpidio Eric on 01/25/2015 08:55:13 Maurice Sanders ([id number]) -------------------------------------------------------------------------------- Lower Extremity Assessment Details Patient Name: Maurice Sanders Date of Service: 01/25/2015 8:00 AM Medical Record Number: [id number] Patient Account Number: [id number] Date of Birth/Sex: 21-Nov-1945 (68 y.o. Male) Treating RN: Afful, RN, BSN, [place] Sink Primary Care Physician: Beverely Risen Other Clinician: Referring Physician: Beverely Risen Treating Physician/Extender: Rudene Re in Treatment: 28 Edema Assessment Assessed: [Left: No] [Right: No] E[Left: dema] [Right: :] Calf Left: Right: Point of Measurement: 33 cm From Medial Instep cm 37.5 cm Ankle Left: Right: Point of Measurement: 11 cm From Medial Instep cm 24.2 cm Vascular Assessment Claudication: Claudication Assessment [Right:None] Pulses: Posterior Tibial Dorsalis Pedis Palpable: [Right:Yes] Extremity colors, hair growth, and conditions: Extremity Color: [Right:Hyperpigmented] Hair Growth on Extremity: [Right:No] Temperature of Extremity: [Right:Warm] Capillary Refill: [Right:< 3 seconds] Dependent Rubor: [Right:No] Blanched when Elevated: [Right:No] Lipodermatosclerosis: [Right:No] Toe Nail Assessment Left: Right: Thick: Yes Discolored: Yes Deformed: No Improper Length and Hygiene: No RUSTIN, ERHART ([id number]) Electronic Signature(s) Signed: 01/25/2015 5:53:15 PM By: Elpidio Eric BSN, RN Entered By: Elpidio Eric on 01/25/2015 74:25:95 Maurice Sanders ([id number]) -------------------------------------------------------------------------------- Multi Wound Chart  Details Patient Name: Maurice Sanders Date of Service: 01/25/2015 8:00 AM Medical Record Number: [id number] Patient Account Number: [id number] Date of Birth/Sex: [birth year] (69 y.o. Male) Treating RN: Clover Mealy, RN, BSN,  Sink Primary Care Physician: Beverely Risen Other Clinician: Referring Physician: Beverely Risen Treating Physician/Extender: Rudene Re in Treatment: 28 Vital Signs Height(in): 71 Pulse(bpm): 66 Weight(lbs): 291.7 Blood Pressure 170/75 (mmHg): Body Mass Index(BMI): 41 Temperature(F): 98.0 Respiratory Rate 18 (breaths/min): Photos: [2:No Photos] [N/A:N/A] Wound Location: [2:Right Malleolus - Medial, Proximal] [N/A:N/A] Wounding Event: [2:Gradually Appeared] [N/A:N/A] Primary Etiology: [2:Venous Leg Ulcer] [N/A:N/A] Comorbid History: [2:Sleep Apnea, Congestive Heart Failure, Deep Vein Thrombosis, Hypertension, Type II Diabetes, Gout] [N/A:N/A] Date Acquired: [  2:07/31/2014] [N/A:N/A] Weeks of Treatment: [2:25] [N/A:N/A] Wound Status: [2:Open] [N/A:N/A] Measurements L x W x D 3x2.5x0.2 [N/A:N/A] (cm) Area (cm) : [2:5.89] [N/A:N/A] Volume (cm) : [2:1.178] [N/A:N/A] % Reduction in Area: [2:-733.10%] [N/A:N/A] % Reduction in Volume: -1559.20% [N/A:N/A] Classification: [2:Full Thickness Without Exposed Support Structures] [N/A:N/A] HBO Classification: [2:Grade 1] [N/A:N/A] Exudate Amount: [2:Medium] [N/A:N/A] Exudate Type: [2:Serosanguineous] [N/A:N/A] Exudate Color: [2:red, brown] [N/A:N/A] Wound Margin: [2:Distinct, outline attached] [N/A:N/A] Granulation Amount: [2:Medium (34-66%)] [N/A:N/A] Granulation Quality: [2:Red, Pink] [N/A:N/A] Necrotic Amount: [2:Medium (34-66%)] [N/A:N/A] Exposed Structures: Fascia: No N/A N/A Fat: No Tendon: No Muscle: No Joint: No Bone: No Limited to Skin Breakdown Epithelialization: Medium (34-66%) N/A N/A Periwound Skin Texture: Scarring: Yes N/A N/A Edema: No Excoriation: No Induration: No Callus: No Crepitus:  No Fluctuance: No Friable: No Rash: No Periwound Skin Moist: Yes N/A N/A Moisture: Dry/Scaly: Yes Maceration: No Periwound Skin Color: Hemosiderin Staining: Yes N/A N/A Atrophie Blanche: No Cyanosis: No Ecchymosis: No Erythema: No Mottled: No Pallor: No Rubor: No Temperature: No Abnormality N/A N/A Tenderness on No N/A N/A Palpation: Wound Preparation: Ulcer Cleansing: N/A N/A Rinsed/Irrigated with Saline, Other: water and soap Topical Anesthetic Applied: Other: lidocaine 4% Treatment Notes Electronic Signature(s) Signed: 01/25/2015 5:53:15 PM By: Elpidio Eric BSN, RN Entered By: Elpidio Eric on 01/25/2015 08:32:30 Maurice Sanders ([id number]) -------------------------------------------------------------------------------- Multi-Disciplinary Care Plan Details Patient Name: Maurice Sanders Date of Service: 01/25/2015 8:00 AM Medical Record Number: [id number] Patient Account Number: [id number] Date of Birth/Sex: November 06, 1945 (68 y.o. Male) Treating RN: Afful, RN, BSN, [place] Sink Primary Care Physician: Beverely Risen Other Clinician: Referring Physician: Beverely Risen Treating Physician/Extender: Rudene Re in Treatment: 75 Active Inactive Abuse / Safety / Falls / Self Care Management Nursing Diagnoses: Potential for falls Goals: Patient will remain injury free Date Initiated: 07/10/2014 Goal Status: Active Interventions: Assess fall risk on admission and as needed Notes: Nutrition Nursing Diagnoses: Potential for alteratiion in Nutrition/Potential for imbalanced nutrition Goals: Patient/caregiver agrees to and verbalizes understanding of need to use nutritional supplements and/or vitamins as prescribed Date Initiated: 07/10/2014 Goal Status: Active Interventions: Assess patient nutrition upon admission and as needed per policy Notes: Orientation to the Wound Care Program Nursing Diagnoses: Knowledge deficit related to the wound healing center  program Goals: Patient/caregiver will verbalize understanding of the Wound Healing Center Program [place], [place] ([id number]) Date Initiated: 07/10/2014 Goal Status: Active Interventions: Provide education on orientation to the wound center Notes: Wound/Skin Impairment Nursing Diagnoses: Impaired tissue integrity Goals: Ulcer/skin breakdown will have a volume reduction of 30% by week 4 Date Initiated: 07/10/2014 Goal Status: Active Interventions: Assess ulceration(s) every visit Notes: Electronic Signature(s) Signed: 01/25/2015 5:53:15 PM By: Elpidio Eric BSN, RN Entered By: Elpidio Eric on 01/25/2015 08:32:18 Maurice Sanders ([id number]) -------------------------------------------------------------------------------- Pain Assessment Details Patient Name: Maurice Sanders Date of Service: 01/25/2015 8:00 AM Medical Record Number: [id number] Patient Account Number: [id number] Date of Birth/Sex: Sep 14, 1945 (69 y.o. Male) Treating RN: Clover Mealy, RN, BSN, [place] Sink Primary Care Physician: Beverely Risen Other Clinician: Referring Physician: Beverely Risen Treating Physician/Extender: Rudene Re in Treatment: 28 Active Problems Location of Pain Severity and Description of Pain Patient Has Paino No Site Locations Pain Management and Medication Current Pain Management: Electronic Signature(s) Signed: 01/25/2015 5:53:15 PM By: Elpidio Eric BSN, RN Entered By: Elpidio Eric on 01/25/2015 08:20:21 Maurice Sanders ([id number]) -------------------------------------------------------------------------------- Patient/Caregiver Education Details Patient Name: Maurice Sanders Date of Service: 01/25/2015 8:00 AM Medical Record Number: [id number] Patient Account Number: [id number] Date of Birth/Gender: Mar 24, 1946 (68 y.o. Male) Treating RN: Afful, RN, BSN, [facility]  Physician: Beverely Risen Other Clinician: Referring Physician: Beverely Risen Treating Physician/Extender: Rudene Re in Treatment:  80 Education Assessment Education Provided To: Patient Education Topics Provided Wound/Skin Impairment: Handouts: Other: elevate legs above heart Methods: Explain/Verbal Responses: State content correctly Electronic Signature(s) Signed: 01/25/2015 5:53:15 PM By: Elpidio Eric BSN, RN Entered By: Elpidio Eric on 01/25/2015 08:55:35 Maurice Sanders ([id number]) -------------------------------------------------------------------------------- Wound Assessment Details Patient Name: Maurice Sanders Date of Service: 01/25/2015 8:00 AM Medical Record Number: [id number] Patient Account Number: [id number] Date of Birth/Sex: Dec 04, 1945 (68 y.o. Male) Treating RN: Afful, RN, BSN, [place] Primary Care Physician: Beverely Risen Other Clinician: Referring Physician: Beverely Risen Treating Physician/Extender: Rudene Re in Treatment: 28 Wound Status Wound Number: 2 Primary Venous Leg Ulcer Etiology: Wound Location: Right Malleolus - Medial, Proximal Wound Open Status: Wounding Event: Gradually Appeared Comorbid Sleep Apnea, Congestive Heart Failure, Date Acquired: 07/31/2014 History: Deep Vein Thrombosis, Hypertension, Weeks Of Treatment: 25 Type II Diabetes, Gout Clustered Wound: No Photos Photo Uploaded By: Elpidio Eric on 01/25/2015 17:16:16 Wound Measurements Length: (cm) 3 Width: (cm) 2.5 Depth: (cm) 0.2 Area: (cm) 5.89 Volume: (cm) 1.178 % Reduction in Area: -733.1% % Reduction in Volume: -1559.2% Epithelialization: Medium (34-66%) Tunneling: No Undermining: No Wound Description Full Thickness Without Exposed Foul Odor A Classification: Support Structures Diabetic Severity Grade 1 (Wagner): Wound Margin: Distinct, outline attached Exudate Amount: Medium Exudate Type: Serosanguineous Exudate Color: red, brown fter Cleansing: No Wound Bed Granulation Amount: Medium (34-66%) Exposed Structure Grissom, Jerian ([id number]) Granulation Quality: Red, Pink Fascia Exposed:  No Necrotic Amount: Medium (34-66%) Fat Layer Exposed: No Necrotic Quality: Adherent Slough Tendon Exposed: No Muscle Exposed: No Joint Exposed: No Bone Exposed: No Limited to Skin Breakdown Periwound Skin Texture Texture Color No Abnormalities Noted: No No Abnormalities Noted: No Callus: No Atrophie Blanche: No Crepitus: No Cyanosis: No Excoriation: No Ecchymosis: No Fluctuance: No Erythema: No Friable: No Hemosiderin Staining: Yes Induration: No Mottled: No Localized Edema: No Pallor: No Rash: No Rubor: No Scarring: Yes Temperature / Pain Moisture Temperature: No Abnormality No Abnormalities Noted: No Dry / Scaly: Yes Maceration: No Moist: Yes Wound Preparation Ulcer Cleansing: Rinsed/Irrigated with Saline, Other: water and soap, Topical Anesthetic Applied: Other: lidocaine 4%, Treatment Notes Wound #2 (Right, Proximal, Medial Malleolus) 1. Cleansed with: Cleanse wound with antibacterial soap and water 3. Peri-wound Care: Barrier cream Moisturizing lotion 4. Dressing Applied: Prisma Ag 5. Secondary Dressing Applied ABD Pad 7. Secured with 4-Layer Compression System - Right Lower Extremity Notes contact layer applied on non wounded areas [place] ([id number]) Electronic Signature(s) Signed: 01/25/2015 5:53:15 PM By: Elpidio Eric BSN, RN Entered By: Elpidio Eric on 01/25/2015 08:24:55 Maurice Sanders ([id number]) -------------------------------------------------------------------------------- Vitals Details Patient Name: Maurice Sanders Date of Service: 01/25/2015 8:00 AM Medical Record Number: [id number] Patient Account Number: [id number] Date of Birth/Sex: 06-29-1946 (68 y.o. Male) Treating RN: Afful, RN, BSN, [place] Primary Care Physician: Beverely Risen Other Clinician: Referring Physician: Beverely Risen Treating Physician/Extender: Rudene Re in Treatment: 28 Vital Signs Time Taken: 08:22 Temperature (F): 98.0 Height (in): 71 Pulse  (bpm): 66 Weight (lbs): 291.7 Respiratory Rate (breaths/min): 18 Body Mass Index (BMI): 40.7 Blood Pressure (mmHg): 170/75 Reference Range: 80 - 120 mg / dl Electronic Signature(s) Signed: 01/25/2015 5:53:15 PM By: Elpidio Eric BSN, RN Entered By: Elpidio Eric on 01/25/2015 00:37:04

## 2015-01-31 DIAGNOSIS — G4733 Obstructive sleep apnea (adult) (pediatric): Secondary | ICD-10-CM | POA: Diagnosis not present

## 2015-02-01 ENCOUNTER — Encounter: Payer: Medicare Other | Admitting: Surgery

## 2015-02-01 DIAGNOSIS — L97319 Non-pressure chronic ulcer of right ankle with unspecified severity: Secondary | ICD-10-CM | POA: Diagnosis not present

## 2015-02-01 DIAGNOSIS — I87311 Chronic venous hypertension (idiopathic) with ulcer of right lower extremity: Secondary | ICD-10-CM | POA: Diagnosis not present

## 2015-02-01 DIAGNOSIS — I83013 Varicose veins of right lower extremity with ulcer of ankle: Secondary | ICD-10-CM | POA: Diagnosis not present

## 2015-02-01 DIAGNOSIS — E11620 Type 2 diabetes mellitus with diabetic dermatitis: Secondary | ICD-10-CM | POA: Diagnosis not present

## 2015-02-01 NOTE — Progress Notes (Signed)
TARELL, SCHOLLMEYER ([id number]) Visit Report for 02/01/2015 Chief Complaint Document Details Patient Name: Maurice Sanders Date of Service: 02/01/2015 8:00 AM Medical Record Number: [id number] Patient Account Number: [id number] Date of Birth/Sex: 20-Apr-1946 (69 y.o. Male) Treating RN: Primary Care Physician: Beverely Risen Other Clinician: Referring Physician: Beverely Risen Treating Physician/Extender: Rudene Re in Treatment: 29 Information Obtained from: Patient Chief Complaint Maurice Sanders has been coming for dressing changes twice a week, on Monday and Thursday. Over all he is doing fine he has his blood sugar under control and is managing his diuretics well. after changing over from calamine to zinc oxide he said his own Unna's boot still gave him a bit of itching and problems and he had significant swelling which made the Unna's boot fairly tight. Electronic Signature(s) Signed: 02/01/2015 1:47:03 PM By: Evlyn Kanner MD, FACS Entered By: Evlyn Kanner on 02/01/2015 08:56:30 Maurice Sanders ([id number]) -------------------------------------------------------------------------------- Cellular or Tissue Based Product Details Patient Name: Maurice Sanders Date of Service: 02/01/2015 8:00 AM Medical Record Number: [id number] Patient Account Number: [id number] Date of Birth/Sex: 1946-07-13 (68 y.o. Male) Treating RN: Primary Care Physician: Beverely Risen Other Clinician: Referring Physician: Beverely Risen Treating Physician/Extender: Rudene Re in Treatment: 29 Cellular or Tissue Based Wound #2 Right,Proximal,Medial Malleolus Product Type Applied to: Performed By: Physician Tristan Schroeder., MD Cellular or Tissue Based Apligraf Product Type: Time-Out Taken: Yes Location: trunk / arms / legs Wound Size (sq cm): 6.6 Product Size (sq cm): 44 Waste Size (sq cm): 37 Waste Reason: extra Amount of Product Applied (sq cm): 7 Lot #: [id number] Expiration Date: 02/08/2015 Fenestrated:  Yes Instrument: Blade Reconstituted: Yes Solution Type: Normal Saline Solution Amount: 5ml Lot #: [id number] Solution Expiration 11/02/2016 Date: Secured: Yes Secured With: Steri-Strips Dressing Applied: Yes Primary Dressing: adaptic/mepitel Procedural Pain: 0 Post Procedural Pain: 0 Response to Treatment: Procedure was tolerated well Electronic Signature(s) Signed: 02/01/2015 1:47:03 PM By: Evlyn Kanner MD, FACS Entered By: Evlyn Kanner on 02/01/2015 08:56:22 Maurice Sanders ([id number]) -------------------------------------------------------------------------------- Debridement Details Patient Name: Maurice Sanders Date of Service: 02/01/2015 8:00 AM Medical Record Number: [id number] Patient Account Number: [id number] Date of Birth/Sex: 1946/03/10 (69 y.o. Male) Treating RN: Primary Care Physician: Beverely Risen Other Clinician: Referring Physician: Beverely Risen Treating Physician/Extender: Rudene Re in Treatment: 29 Debridement Performed for Wound #2 Right,Proximal,Medial Malleolus Assessment: Performed By: Physician Tristan Schroeder., MD Debridement: Debridement Pre-procedure Yes Verification/Time Out Taken: Start Time: 08:40 Pain Control: Lidocaine 4% Topical Solution Level: Skin/Subcutaneous Tissue Total Area Debrided (L x 3 (cm) x 2.2 (cm) = 6.6 (cm) W): Tissue and other Viable, Non-Viable, Exudate, Fibrin/Slough, Subcutaneous material debrided: Instrument: Curette Bleeding: Minimum Hemostasis Achieved: Pressure End Time: 08:42 Procedural Pain: 0 Post Procedural Pain: 0 Response to Treatment: Procedure was tolerated well Post Debridement Measurements of Total Wound Length: (cm) 3 Width: (cm) 2.2 Depth: (cm) 0.2 Volume: (cm) 1.037 Electronic Signature(s) Signed: 02/01/2015 1:47:03 PM By: Evlyn Kanner MD, FACS Entered By: Evlyn Kanner on 02/01/2015 08:55:46 Maurice Sanders  ([id number]) -------------------------------------------------------------------------------- HPI Details Patient Name: Maurice Sanders Date of Service: 02/01/2015 8:00 AM Medical Record Number: [id number] Patient Account Number: [id number] Date of Birth/Sex: Jul 21, 1946 (69 y.o. Male) Treating RN: Primary Care Physician: Beverely Risen Other Clinician: Referring Physician: Beverely Risen Treating Physician/Extender: Rudene Re in Treatment: 46 History of Present Illness Location: right lower extremity Quality: Patient reports experiencing a dull pain to affected area(s). Severity: patient denies any severe pain. Duration: he has had this wound for a few weeks now. Timing: Pain in wound is Intermittent (comes  and goes Context: the patient has a long history of venous insufficiency. He has been very compliant with wearing his compression stockings. Modifying Factors: the patient normally wears compression stockings. Recently he had placed a fair amount of Vaseline which cause skin irritation. Associated Signs and Symptoms: Patient denies any fevers. He has had a moderate amount of drainage. HPI Description: The patient is 69 year-old male with a history of diabetes who presents with right lower extremity wound. He first presented to our clinic on 07/10/2014. He does have a history of venous insufficiency in the past. He has been very compliant with wearing his compression stockings. He recently Vaseline on his legs which caused skin irritation and breakdown. The patient returns for follow-up today. he denies any fevers at home. However due to the fact that he had stopped his diuretic he has had a lot of edema of the lower extremity. He also has a lot of scaly skin on his right lower extremity. 10/02/14 -- he has resumed his diuretics on a regular basis and has been walking around adequately and has overall improved. No pain or no discharge from the wounds. 10/09/14 -- the patient has been  having some pain at night while sleeping and was requesting pain medications. He also says that due to taking his diuretics he's been getting some cramps and I have asked him to please see his PCP regarding both these problems. Also awaiting insurance clearance regarding his Apligraf. addendum: I have now got a clear copy of his vascular workup studies and have reviewed the entire details dated encounter 08/17/2014. the lower extremity venous duplex study done on 08/17/2014 revealed no evidence of deep vein thrombosis or superficial thrombophlebitis bilaterally, no incompetence of the greater small saphenous veins bilaterally, enlarged lymph nodes in the groin bilaterally. As seen by Dr. Gilda Crease on the same day. Dr. Gilda Crease recommended that he should continue wearing graduated compression stockings of class I which is 20-30 mmHg pressure on a daily basis and a prescription was given. He was to be reassessed in 6 months and the question of getting him lymphedema pumps were also discussed. 10/16/2014. Vascular note reviewed in detail. I have been able to review his notes from Dr. Gilda Crease on 08/17/2014. 1.The venous duplex study done on 08/17/2014 showed that all veins visualized showed no evidence of DVT. 2. no incompetence of the greater small saphenous veins bilaterally. DEE, MADAY ([id number]) The plan was that he had no surgery or intervention needed at this point. The long discussion had showed that the patient had chronic skin changes accompanied by venous insufficiency and at this stage he would benefit from wearing graduated compression stockings of the 20-30 mmHg on a daily basis and review would be done in his office back again in 6 months. At that time the patient could have a lymph pump. except for the new abrasion on the right lower extremity just below his tibial tuberosity he is doing fine and his ulcers actually look much better. 10/23/14 -- Culture report on Maurice Sanders  --- His wound culture has grown Citrobacter koseri and Enterococcus faecalis. Final report is back but so far sensitive to Bactrim and ampicillin and ciprofloxacin. Bactrim DS was called in for 14 days. 11/13/2014 last week we changed his Unna boot on Monday and Thursday and this has helped him a lot. The edema has been much better the Unna boot hasn't slipped down and he has not had any skin abrasions. 11/20/2014 -- he saw the vascular surgeon Dr.  Schnier this morning and he was pleased with his progress and has said he would be ordering him some lymphedema pumps. Other than that the patient is coming to change his Unna's boots twice a week and he is doing well with this. 11/27/2014 -- over Saturday and Sunday he started having more swelling in his leg and some pain and I believe his own Unna's boot caused him some discomfort and excoriation of skin. He does admit he took some more juices orally but he has continued his diuretic. 12/11/2014 -- he has completed his course of antibiotics and has had no fresh issues. He has had no pain no swelling and is managing well with his blood sugars and diuretics. 12/14/2014 between Monday and today he has had some dietary indiscretions and had some [place] food which may have made him retain a lot of fluid. His edema has increased markedly and a lot of more excoriation. 12/25/2014 -- This week he has really done well and his edema is under control and these had no pain or problems. His leg is feeling good. 01/04/2015 -- He has done great along the way and he has managed to keep his wrap for almost her entire week. No issues or problems. 02/01/2015 -- he is doing fine and easier for his first application of the Apligraf skin substitute Electronic Signature(s) Signed: 02/01/2015 1:47:03 PM By: Evlyn Kanner MD, FACS Entered By: Evlyn Kanner on 02/01/2015 08:57:05 Maurice Sanders  ([id number]) -------------------------------------------------------------------------------- Physical Exam Details Patient Name: Maurice Sanders Date of Service: 02/01/2015 8:00 AM Medical Record Number: [id number] Patient Account Number: [id number] Date of Birth/Sex: Sep 03, 1945 (69 y.o. Male) Treating RN: Primary Care Physician: Beverely Risen Other Clinician: Referring Physician: Beverely Risen Treating Physician/Extender: Rudene Re in Treatment: 29 Constitutional . Pulse regular. Respirations normal and unlabored. Afebrile. . Eyes Nonicteric. Reactive to light. Ears, Nose, Mouth, and Throat Lips, teeth, and gums WNL.Marland Kitchen Moist mucosa without lesions . Neck supple and nontender. No palpable supraclavicular or cervical adenopathy. Normal sized without goiter. Respiratory WNL. No retractions.. Cardiovascular Pedal Pulses WNL. No clubbing, cyanosis or edema. Musculoskeletal Adexa without tenderness or enlargement.. Digits and nails w/o clubbing, cyanosis, infection, petechiae, ischemia, or inflammatory conditions.. Integumentary (Hair, Skin) wound has minimal debris and will need sharp debridement prior to application of the skin substitute.. No crepitus or fluctuance. No peri-wound warmth or erythema. No masses.Marland Kitchen Psychiatric Judgement and insight Intact.. No evidence of depression, anxiety, or agitation.. Electronic Signature(s) Signed: 02/01/2015 1:47:03 PM By: Evlyn Kanner MD, FACS Entered By: Evlyn Kanner on 02/01/2015 08:57:47 Maurice Sanders ([id number]) -------------------------------------------------------------------------------- Physician Orders Details Patient Name: Maurice Sanders Date of Service: 02/01/2015 8:00 AM Medical Record Number: [id number] Patient Account Number: [id number] Date of Birth/Sex: 01-30-1946 (69 y.o. Male) Treating RN: Clover Mealy, RN, BSN, Courtland Sink Primary Care Physician: Beverely Risen Other Clinician: Referring Physician: Beverely Risen Treating  Physician/Extender: Rudene Re in Treatment: 93 Verbal / Phone Orders: Yes Clinician: Afful, RN, BSN, Rita Read Back and Verified: Yes Diagnosis Coding Skin Barriers/Peri-Wound Care Wound #2 Right,Proximal,Medial Malleolus o Skin Prep Dressing Change Frequency Wound #2 Right,Proximal,Medial Malleolus o Change dressing every week Follow-up Appointments Wound #2 Right,Proximal,Medial Malleolus o Return Appointment in 1 week. Edema Control Wound #2 Right,Proximal,Medial Malleolus o 4-Layer Compression System - Right Lower Extremity Advanced Therapies Wound #2 Right,Proximal,Medial Malleolus o Apligraf application in clinic; including contact layer, fixation with steri strips, dry gauze and cover dressing. Electronic Signature(s) Signed: 02/01/2015 8:57:46 AM By: Elpidio Eric BSN, RN Signed: 02/01/2015 1:47:03 PM By: Evlyn Kanner  MD, FACS Previous Signature: 02/01/2015 8:56:44 AM Version By: Elpidio Eric BSN, RN Entered By: Elpidio Eric on 02/01/2015 08:57:46 Maurice Sanders ([id number]) -------------------------------------------------------------------------------- Problem List Details Patient Name: Maurice Sanders Date of Service: 02/01/2015 8:00 AM Medical Record Number: [id number] Patient Account Number: [id number] Date of Birth/Sex: 12/27/1945 (68 y.o. 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 Male) Treating RN: Primary Care Physician: Beverely Risen Other Clinician: Referring Physician: Beverely Risen Treating Physician/Extender: Rudene Re in Treatment: 13 Subjective Chief Complaint Information obtained from Patient Adryan has been coming for dressing changes twice a week, on Monday and Thursday. Over all he is doing fine he has his blood sugar under control and is managing his diuretics well. after changing over from calamine to zinc oxide he said his own Unna's boot still gave him a bit of itching and problems and he had significant swelling which made the Unna's boot fairly tight. History of Present Illness (HPI) The following HPI elements were documented for the patient's wound: Location: right lower extremity Quality: Patient reports experiencing a dull pain to affected area(s). Severity: patient denies any severe pain. Duration: he has had this wound for a few weeks now. Timing: Pain in wound is Intermittent (comes and goes Context: the patient has a long history of venous insufficiency. He has been very compliant with wearing his compression stockings. Modifying Factors: the patient normally wears compression stockings. Recently he had placed a fair amount of Vaseline which cause skin irritation. Associated Signs and Symptoms: Patient denies any fevers. He has had a moderate amount of drainage. The patient is 69 year-old male with a history of diabetes who presents with right lower extremity wound. He first presented to our clinic on 07/10/2014. He does have a history of venous insufficiency in the past. He has  been very compliant with wearing his compression stockings. He recently Vaseline on his legs which caused skin irritation and breakdown. The patient returns for follow-up today. he denies any fevers at home. However due to the fact that he had stopped his diuretic he has had a lot of edema of the lower extremity. He also has a lot of scaly skin on his right lower extremity. 10/02/14 -- he has resumed his diuretics on a regular basis and has been walking around adequately and has overall improved. No pain or no discharge from the wounds. 10/09/14 -- the patient has been having some pain at night while sleeping and was requesting pain medications. He also says that due to taking his diuretics he's been getting some cramps and I have asked him to please see his PCP regarding both these problems. Also awaiting insurance clearance regarding his Apligraf. addendum: I have now got a clear copy of his vascular workup studies and have reviewed the entire details dated encounter 08/17/2014. the lower extremity venous duplex study done on  08/17/2014 revealed no evidence of deep vein thrombosis or superficial thrombophlebitis bilaterally, no incompetence of the greater small saphenous veins bilaterally, enlarged lymph nodes in the groin bilaterally. LEANORD, THIBEAU ([id number]) As seen by Dr. Gilda Crease on the same day. Dr. Gilda Crease recommended that he should continue wearing graduated compression stockings of class I which is 20-30 mmHg pressure on a daily basis and a prescription was given. He was to be reassessed in 6 months and the question of getting him lymphedema pumps were also discussed. 10/16/2014. Vascular note reviewed in detail. I have been able to review his notes from Dr. Gilda Crease on 08/17/2014. 1.The venous duplex study done on 08/17/2014 showed that all veins visualized showed no evidence of DVT. 2. no incompetence of the greater small saphenous veins bilaterally. The plan was that he had no  surgery or intervention needed at this point. The long discussion had showed that the patient had chronic skin changes accompanied by venous insufficiency and at this stage he would benefit from wearing graduated compression stockings of the 20-30 mmHg on a daily basis and review would be done in his office back again in 6 months. At that time the patient could have a lymph pump. except for the new abrasion on the right lower extremity just below his tibial tuberosity he is doing fine and his ulcers actually look much better. 10/23/14 -- Culture report on Maurice Sanders --- His wound culture has grown Citrobacter koseri and Enterococcus faecalis. Final report is back but so far sensitive to Bactrim and ampicillin and ciprofloxacin. Bactrim DS was called in for 14 days. 11/13/2014 last week we changed his Unna boot on Monday and Thursday and this has helped him a lot. The edema has been much better the Unna boot hasn't slipped down and he has not had any skin abrasions. 11/20/2014 -- he saw the vascular surgeon Dr. Gilda Crease this morning and he was pleased with his progress and has said he would be ordering him some lymphedema pumps. Other than that the patient is coming to change his Unna's boots twice a week and he is doing well with this. 11/27/2014 -- over Saturday and Sunday he started having more swelling in his leg and some pain and I believe his own Unna's boot caused him some discomfort and excoriation of skin. He does admit he took some more juices orally but he has continued his diuretic. 12/11/2014 -- he has completed his course of antibiotics and has had no fresh issues. He has had no pain no swelling and is managing well with his blood sugars and diuretics. 12/14/2014 between Monday and today he has had some dietary indiscretions and had some [place] food which may have made him retain a lot of fluid. His edema has increased markedly and a lot of more excoriation. 12/25/2014 -- This  week he has really done well and his edema is under control and these had no pain or problems. His leg is feeling good. 01/04/2015 -- He has done great along the way and he has managed to keep his wrap for almost her entire week. No issues or problems. 02/01/2015 -- he is doing fine and easier for his first application of the Apligraf skin substitute Wigen, Tico ([id number]) Objective Constitutional Pulse regular. Respirations normal and unlabored. Afebrile. Vitals Time Taken: 8:13 AM, Height: 71 in, Weight: 291.7 lbs, BMI: 40.7, Temperature: 98.2 F, Pulse: 68 bpm, Respiratory Rate: 18 breaths/min, Blood Pressure: 146/76 mmHg. Eyes Nonicteric. Reactive to light. Ears, Nose, Mouth, and Throat Lips,  teeth, and gums WNL.Marland Kitchen. Moist mucosa without lesions . Neck supple and nontender. No palpable supraclavicular or cervical adenopathy. Normal sized without goiter. Respiratory WNL. No retractions.. Cardiovascular Pedal Pulses WNL. No clubbing, cyanosis or edema. Musculoskeletal Adexa without tenderness or enlargement.. Digits and nails w/o clubbing, cyanosis, infection, petechiae, ischemia, or inflammatory conditions.Marland Kitchen. Psychiatric Judgement and insight Intact.. No evidence of depression, anxiety, or agitation.. Integumentary (Hair, Skin) wound has minimal debris and will need sharp debridement prior to application of the skin substitute.. No crepitus or fluctuance. No peri-wound warmth or erythema. No masses.. Wound #2 status is Open. Original cause of wound was Gradually Appeared. The wound is located on the Right,Proximal,Medial Malleolus. The wound measures 3cm length x 2.2cm width x 0.2cm depth; 5.184cm^2 area and 1.037cm^3 volume. The wound is limited to skin breakdown. There is no tunneling or undermining noted. There is a medium amount of serosanguineous drainage noted. The wound margin is distinct with the outline attached to the wound base. There is medium (34-66%) red, pink  granulation within the wound bed. There is a medium (34-66%) amount of necrotic tissue within the wound bed including Adherent Slough. The periwound skin appearance exhibited: Scarring, Dry/Scaly, Moist, Hemosiderin Staining. The periwound skin appearance did not exhibit: Callus, Crepitus, Excoriation, Fluctuance, Friable, Induration, Localized Edema, Rash, Maceration, Atrophie Blanche, Cyanosis, Ecchymosis, Mottled, Pallor, Gadson, Reyes ([id number]) Rubor, Erythema. Periwound temperature was noted as No Abnormality. 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 We will continue with the 4-layer Profore wrap and he will continue with all his usual precautions. We will see him back for a wound check next week. Procedures Wound #2 Wound #2 is a Venous Leg Ulcer located on the Right,Proximal,Medial Malleolus . There was a Skin/Subcutaneous Tissue Debridement ([id number]) debridement with total area of 6.6 sq cm performed by Tristan SchroederBritto, Lenon Kuennen J., MD. with the following instrument(s): Curette to remove Viable and Non-Viable tissue/material including Exudate, Fibrin/Slough, and Subcutaneous after achieving pain control using Lidocaine 4% Topical Solution. A time out was conducted prior to the start of the procedure. A Minimum amount of bleeding was controlled with Pressure. The procedure was tolerated well with a pain level of 0 throughout and a pain level of 0 following the procedure. Post Debridement Measurements: 3cm length x 2.2cm width x 0.2cm depth; 1.037cm^3 volume. Wound #2 is a Venous Leg Ulcer located on the Right,Proximal,Medial Malleolus. A skin graft procedure using a bioengineered skin  substitute/cellular or tissue based product was performed by Ayaan Shutes, Ignacia FellingErrol J., MD. Apligraf was applied and secured with Steri-Strips. 7 sq cm of product was utilized and 37 sq cm was wasted due to extra. Post Application, adaptic/mepitel was applied. A Time Out was conducted prior to the start of the procedure. The procedure was tolerated well with a pain level of 0 throughout and a pain level of 0 following the procedure. Plan Maurice BayleySILER, Elward ([id number]) Skin Barriers/Peri-Wound Care: Wound #2 Right,Proximal,Medial Malleolus: Skin Prep Dressing Change Frequency: Wound #2 Right,Proximal,Medial Malleolus: Change dressing every week Follow-up Appointments: Wound #2 Right,Proximal,Medial Malleolus: Return Appointment in 1 week. Edema Control: Wound #2 Right,Proximal,Medial Malleolus: 4-Layer Compression System - Right Lower Extremity Advanced Therapies: Wound #2 Right,Proximal,Medial Malleolus: Apligraf application in clinic; including contact layer, fixation with steri strips, dry gauze and  cover dressing. The wound looks excellent and is ready for the first application of Apligraf skin substitute. We will continue with the 4-layer Profore wrap and he will continue with all his usual precautions. We will see him back for a wound check next week Electronic Signature(s) Signed: 02/01/2015 1:47:03 PM By: Evlyn Kanner MD, FACS Entered By: Evlyn Kanner on 02/01/2015 08:58:39 Maurice Sanders ([id number]) -------------------------------------------------------------------------------- SuperBill Details Patient Name: Maurice Sanders Date of Service: 02/01/2015 Medical Record Number: [id number] Patient Account Number: [id number] Date of Birth/Sex: Dec 19, 1945 (69 y.o. 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 ulcer of ankle Facility Procedures CPT4 Code Description: 08657846 (Facility Use Only) Apligraf 1 SQ CM Modifier: Quantity: 44 CPT4 Code Description: 96295284 15271 - SKIN SUB GRAFT TRNK/ARM/LEG ICD-10 Description Diagnosis 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 I83.013 Varicose veins of right lower  extremity with ulcer o Modifier: of right lowe f ankle Quantity: 1 r extremity Physician Procedures CPT4 Code Description: 1324401 15271 - WC PHYS SKIN SUB GRAFT TRNK/ARM/LEG ICD-10 Description Diagnosis I87.311 Chronic venous hypertension (idiopathic) with ulcer o 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

## 2015-02-02 NOTE — Progress Notes (Signed)
Maurice Sanders, Maurice Sanders ([id number]) Visit Report for 02/01/2015 Arrival Information Details Patient Name: Maurice Sanders Date of Service: 02/01/2015 8:00 AM Medical Record Number: [id number] Patient Account Number: [id number] Date of Birth/Sex: [birth year] (69 y.o. Male) Treating RN: Afful, RN, BSN, [place] Sink Primary Care Physician: Beverely Risen Other Clinician: Referring Physician: Beverely Risen Treating Physician/Extender: Rudene Re in Treatment: 29 Visit Information History Since Last Visit Any new allergies or adverse reactions: No Patient Arrived: Ambulatory Had a fall or experienced change in No Arrival Time: 08:06 activities of daily living that may affect Accompanied By: self risk of falls: Transfer Assistance: None Signs or symptoms of abuse/neglect since last No Patient Identification Verified: Yes visito Secondary Verification Process Yes Hospitalized since last visit: No Completed: Has Dressing in Place as Prescribed: Yes Patient Requires Transmission- No Has Compression in Place as Prescribed: Yes Based Precautions: Pain Present Now: No Patient Has Alerts: Yes Patient Alerts: Patient on Blood Thinner DM II ABI L 1.06; R 1.13 Electronic Signature(s) Signed: 02/01/2015 4:58:02 PM By: Elpidio Eric BSN, RN Entered By: Elpidio Eric on 02/01/2015 08:12:36 Maurice Sanders ([id number]) -------------------------------------------------------------------------------- Encounter Discharge Information Details Patient Name: Maurice Sanders Date of Service: 02/01/2015 8:00 AM Medical Record Number: [id number] Patient Account Number: [id number] Date of Birth/Sex: 13-Nov-1945 (69 y.o. Male) Treating RN: Afful, RN, BSN, [place] Sink Primary Care Physician: Beverely Risen Other Clinician: Referring Physician: Beverely Risen Treating Physician/Extender: Rudene Re in Treatment: 74 Encounter Discharge Information Items Discharge Pain Level: 0 Discharge Condition: Stable Ambulatory Status:  Ambulatory Discharge Destination: Home Transportation: Private Auto Accompanied By: self Schedule Follow-up Appointment: No Medication Reconciliation completed No and provided to Patient/Care Dezyrae Kensinger: Patient Clinical Summary of Care: Declined Electronic Signature(s) Signed: 02/01/2015 10:41:43 AM By: Gwenlyn Perking Entered By: Gwenlyn Perking on 02/01/2015 10:41:43 Maurice Sanders ([id number]) -------------------------------------------------------------------------------- Lower Extremity Assessment Details Patient Name: Maurice Sanders Date of Service: 02/01/2015 8:00 AM Medical Record Number: [id number] Patient Account Number: [id number] Date of Birth/Sex: 1945-09-14 (68 y.o. Male) Treating RN: Afful, RN, BSN, [place] Sink Primary Care Physician: Beverely Risen Other Clinician: Referring Physician: Beverely Risen Treating Physician/Extender: Rudene Re in Treatment: 29 Edema Assessment Assessed: [Left: No] [Right: Yes] E[Left: dema] [Right: :] Calf Left: Right: Point of Measurement: 33 cm From Medial Instep cm 38.2 cm Ankle Left: Right: Point of Measurement: 11 cm From Medial Instep cm 24.2 cm Vascular Assessment Pulses: Posterior Tibial Dorsalis Pedis Palpable: [Right:Yes] Extremity colors, hair growth, and conditions: Extremity Color: [Right:Hyperpigmented] Hair Growth on Extremity: [Right:No] Temperature of Extremity: [Right:Warm] Capillary Refill: [Right:< 3 seconds] Dependent Rubor: [Right:No] Blanched when Elevated: [Right:No] Lipodermatosclerosis: [Right:No] Toe Nail Assessment Left: Right: Thick: Yes Discolored: Yes Deformed: No Improper Length and Hygiene: No Electronic Signature(s) Signed: 02/01/2015 4:58:02 PM By: Elpidio Eric BSN, RN Maurice Sanders, Maurice Sanders ([id number]) Entered By: Elpidio Eric on 02/01/2015 08:16:20 Maurice Sanders ([id number]) -------------------------------------------------------------------------------- Multi Wound Chart Details Patient Name:  Maurice Sanders Date of Service: 02/01/2015 8:00 AM Medical Record Number: [id number] Patient Account Number: [id number] Date of Birth/Sex: 1945-10-13 (68 y.o. Male) Treating RN: Clover Mealy, RN, BSN, [place] Sink Primary Care Physician: Beverely Risen Other Clinician: Referring Physician: Beverely Risen Treating Physician/Extender: Rudene Re in Treatment: 29 Vital Signs Height(in): 71 Pulse(bpm): 68 Weight(lbs): 291.7 Blood Pressure 146/76 (mmHg): Body Mass Index(BMI): 41 Temperature(F): 98.2 Respiratory Rate 18 (breaths/min): Photos: [2:No Photos] [N/A:N/A] Wound Location: [2:Right Malleolus - Medial, Proximal] [N/A:N/A] Wounding Event: [2:Gradually Appeared] [N/A:N/A] Primary Etiology: [2:Venous Leg Ulcer] [N/A:N/A] Comorbid History: [2:Sleep Apnea, Congestive Heart Failure, Deep Vein Thrombosis, Hypertension, Type II Diabetes, Gout] [N/A:N/A] Date Acquired: [2:07/31/2014] [  N/A:N/A] Weeks of Treatment: [2:26] [N/A:N/A] Wound Status: [2:Open] [N/A:N/A] Measurements L x W x D 3x2.2x0.2 [N/A:N/A] (cm) Area (cm) : [2:5.184] [N/A:N/A] Volume (cm) : [2:1.037] [N/A:N/A] % Reduction in Area: [2:-633.20%] [N/A:N/A] % Reduction in Volume: -1360.60% [N/A:N/A] Classification: [2:Full Thickness Without Exposed Support Structures] [N/A:N/A] HBO Classification: [2:Grade 1] [N/A:N/A] Exudate Amount: [2:Medium] [N/A:N/A] Exudate Type: [2:Serosanguineous] [N/A:N/A] Exudate Color: [2:red, brown] [N/A:N/A] Wound Margin: [2:Distinct, outline attached] [N/A:N/A] Granulation Amount: [2:Medium (34-66%)] [N/A:N/A] Granulation Quality: [2:Red, Pink] [N/A:N/A] Necrotic Amount: [2:Medium (34-66%)] [N/A:N/A] Exposed Structures: Fascia: No N/A N/A Fat: No Tendon: No Muscle: No Joint: No Bone: No Limited to Skin Breakdown Epithelialization: Medium (34-66%) N/A N/A Debridement: Debridement (40981(11042- N/A N/A 11047) Time-Out Taken: Yes N/A N/A Pain Control: Lidocaine 4% Topical N/A  N/A Solution Tissue Debrided: Fibrin/Slough, Exudates, N/A N/A Subcutaneous Level: Skin/Subcutaneous N/A N/A Tissue Debridement Area (sq 6.6 N/A N/A cm): Instrument: Curette N/A N/A Bleeding: Minimum N/A N/A Hemostasis Achieved: Pressure N/A N/A Procedural Pain: 0 N/A N/A Post Procedural Pain: 0 N/A N/A Debridement Treatment Procedure was tolerated N/A N/A Response: well Post Debridement 3x2.2x0.2 N/A N/A Measurements L x W x D (cm) Post Debridement 1.037 N/A N/A Volume: (cm) Periwound Skin Texture: Scarring: Yes N/A N/A Edema: No Excoriation: No Induration: No Callus: No Crepitus: No Fluctuance: No Friable: No Rash: No Periwound Skin Moist: Yes N/A N/A Moisture: Dry/Scaly: Yes Maceration: No Periwound Skin Color: Hemosiderin Staining: Yes N/A N/A Atrophie Blanche: No Cyanosis: No Ecchymosis: No Erythema: No Mottled: No Belloso, Dmonte ([id number]) Pallor: No Rubor: No Temperature: No Abnormality N/A N/A Tenderness on No N/A N/A Palpation: Wound Preparation: Ulcer Cleansing: N/A N/A Rinsed/Irrigated with Saline, Other: water and soap Topical Anesthetic Applied: Other: lidocaine 4% Procedures Performed: Cellular or Tissue Based N/A N/A Product Debridement Treatment Notes Electronic Signature(s) Signed: 02/01/2015 4:58:02 PM By: Elpidio EricAfful, Rita BSN, RN Entered By: Elpidio EricAfful, Rita on 02/01/2015 08:54:23 Maurice Sanders, Maurice Sanders ([id number]) -------------------------------------------------------------------------------- Multi-Disciplinary Care Plan Details Patient Name: Maurice Sanders, Maurice Sanders Date of Service: 02/01/2015 8:00 AM Medical Record Number: [id number] Patient Account Number: [id number] Date of Birth/Sex: 12/13/1945 (68 y.o. Male) Treating RN: Afful, RN, BSN, [place]inkita Primary Care Physician: Beverely RisenKHAN, FOZIA Other Clinician: Referring Physician: Beverely RisenKHAN, FOZIA Treating Physician/Extender: Rudene ReBritto, Errol Weeks in Treatment: 4029 Active Inactive Abuse / Safety / Falls / Self Care  Management Nursing Diagnoses: Potential for falls Goals: Patient will remain injury free Date Initiated: 07/10/2014 Goal Status: Active Interventions: Assess fall risk on admission and as needed Notes: Nutrition Nursing Diagnoses: Potential for alteratiion in Nutrition/Potential for imbalanced nutrition Goals: Patient/caregiver agrees to and verbalizes understanding of need to use nutritional supplements and/or vitamins as prescribed Date Initiated: 07/10/2014 Goal Status: Active Interventions: Assess patient nutrition upon admission and as needed per policy Notes: Orientation to the Wound Care Program Nursing Diagnoses: Knowledge deficit related to the wound healing center program Goals: Patient/caregiver will verbalize understanding of the Wound Healing Center Program Maurice Sanders, Maurice Sanders ([id number]) Date Initiated: 07/10/2014 Goal Status: Active Interventions: Provide education on orientation to the wound center Notes: Wound/Skin Impairment Nursing Diagnoses: Impaired tissue integrity Goals: Ulcer/skin breakdown will have a volume reduction of 30% by week 4 Date Initiated: 07/10/2014 Goal Status: Active Interventions: Assess ulceration(s) every visit Notes: Electronic Signature(s) Signed: 02/01/2015 4:58:02 PM By: Elpidio EricAfful, Rita BSN, RN Entered By: Elpidio EricAfful, Rita on 02/01/2015 08:54:09 Maurice Sanders, Maurice Sanders ([id number]) -------------------------------------------------------------------------------- Pain Assessment Details Patient Name: Maurice Sanders, Maurice Sanders Date of Service: 02/01/2015 8:00 AM Medical Record Number: [id number] Patient Account Number: [id number] Date of Birth/Sex: 09/17/1945 1(68 y.o. Male) Treating RN: Afful, RN,  BSN, [employer] Sink Primary Care Physician: Beverely Risen Other Clinician: Referring Physician: Beverely Risen Treating Physician/Extender: Rudene Re in Treatment: 29 Active Problems Location of Pain Severity and Description of Pain Patient Has Paino No Site  Locations Pain Management and Medication Current Pain Management: Electronic Signature(s) Signed: 02/01/2015 4:58:02 PM By: Elpidio Eric BSN, RN Entered By: Elpidio Eric on 02/01/2015 08:12:40 Maurice Sanders ([id number]) -------------------------------------------------------------------------------- Patient/Caregiver Education Details Patient Name: Maurice Sanders Date of Service: 02/01/2015 8:00 AM Medical Record Number: [id number] Patient Account Number: [id number] Date of Birth/Gender: [birth year] (68 y.o. Male) Treating RN: Clover Mealy, RN, BSN, [place] Sink Primary Care Physician: Beverely Risen Other Clinician: Referring Physician: Beverely Risen Treating Physician/Extender: Rudene Re in Treatment: 71 Education Assessment Education Provided To: Patient Education Topics Provided Welcome To The Wound Care Center: Methods: Explain/Verbal Responses: State content correctly Electronic Signature(s) Signed: 02/01/2015 4:58:02 PM By: Elpidio Eric BSN, RN Entered By: Elpidio Eric on 02/01/2015 09:11:54 Maurice Sanders ([id number]) -------------------------------------------------------------------------------- Wound Assessment Details Patient Name: Maurice Sanders Date of Service: 02/01/2015 8:00 AM Medical Record Number: [id number] Patient Account Number: [id number] Date of Birth/Sex: Aug 17, 1945 (68 y.o. Male) Treating RN: Afful, RN, BSN, Rita Primary Care Physician: Beverely Risen Other Clinician: Referring Physician: Beverely Risen Treating Physician/Extender: Rudene Re in Treatment: 29 Wound Status Wound Number: 2 Primary Venous Leg Ulcer Etiology: Wound Location: Right Malleolus - Medial, Proximal Wound Open Status: Wounding Event: Gradually Appeared Comorbid Sleep Apnea, Congestive Heart Failure, Date Acquired: 07/31/2014 History: Deep Vein Thrombosis, Hypertension, Weeks Of Treatment: 26 Type II Diabetes, Gout Clustered Wound: No Photos Photo Uploaded By: Elpidio Eric on 02/01/2015  16:56:29 Wound Measurements Length: (cm) 3 Width: (cm) 2.2 Depth: (cm) 0.2 Area: (cm) 5.184 Volume: (cm) 1.037 % Reduction in Area: -633.2% % Reduction in Volume: -1360.6% Epithelialization: Medium (34-66%) Tunneling: No Undermining: No Wound Description Full Thickness Without Exposed Foul Odor A Classification: Support Structures Diabetic Severity Grade 1 (Wagner): Wound Margin: Distinct, outline attached Exudate Amount: Medium Exudate Type: Serosanguineous Exudate Color: red, brown fter Cleansing: No Wound Bed Granulation Amount: Medium (34-66%) Exposed Structure Kath, Maurice Sanders ([id number]) Granulation Quality: Red, Pink Fascia Exposed: No Necrotic Amount: Medium (34-66%) Fat Layer Exposed: No Necrotic Quality: Adherent Slough Tendon Exposed: No Muscle Exposed: No Joint Exposed: No Bone Exposed: No Limited to Skin Breakdown Periwound Skin Texture Texture Color No Abnormalities Noted: No No Abnormalities Noted: No Callus: No Atrophie Blanche: No Crepitus: No Cyanosis: No Excoriation: No Ecchymosis: No Fluctuance: No Erythema: No Friable: No Hemosiderin Staining: Yes Induration: No Mottled: No Localized Edema: No Pallor: No Rash: No Rubor: No Scarring: Yes Temperature / Pain Moisture Temperature: No Abnormality No Abnormalities Noted: No Dry / Scaly: Yes Maceration: No Moist: Yes Wound Preparation Ulcer Cleansing: Rinsed/Irrigated with Saline, Other: water and soap, Topical Anesthetic Applied: Other: lidocaine 4%, Treatment Notes Wound #2 (Right, Proximal, Medial Malleolus) 1. Cleansed with: Cleanse wound with antibacterial soap and water 3. Peri-wound Care: Moisturizing lotion Skin Prep 4. Dressing Applied: Other dressing (specify in notes) 7. Secured with 4-Layer Compression System - Right Lower Extremity Notes contact layer applied on non wounded areas. Apligraf applied and bolstered over with gauze Electronic  Signature(s) Signed: 02/01/2015 4:58:02 PM By: Elpidio Eric BSN, RN Maurice Sanders, Maurice Sanders ([id number]) Entered By: Elpidio Eric on 02/01/2015 08:24:03 Maurice Sanders ([id number]) -------------------------------------------------------------------------------- Vitals Details Patient Name: Maurice Sanders Date of Service: 02/01/2015 8:00 AM Medical Record Number: [id number] Patient Account Number: [id number] Date of Birth/Sex: 07-Jun-1946 (68 y.o. Male) Treating RN: Afful, RN, BSN, [place] Sink Primary Care Physician: Beverely Risen Other Clinician:  Referring Physician: Beverely Risen Treating Physician/Extender: Rudene Re in Treatment: 29 Vital Signs Time Taken: 08:13 Temperature (F): 98.2 Height (in): 71 Pulse (bpm): 68 Weight (lbs): 291.7 Respiratory Rate (breaths/min): 18 Body Mass Index (BMI): 40.7 Blood Pressure (mmHg): 146/76 Reference Range: 80 - 120 mg / dl Electronic Signature(s) Signed: 02/01/2015 4:58:02 PM By: Elpidio Eric BSN, RN Entered By: Elpidio Eric on 02/01/2015 08:13:35

## 2015-02-08 ENCOUNTER — Encounter: Payer: Medicare Other | Attending: Surgery | Admitting: Surgery

## 2015-02-08 DIAGNOSIS — E11620 Type 2 diabetes mellitus with diabetic dermatitis: Secondary | ICD-10-CM | POA: Insufficient documentation

## 2015-02-08 DIAGNOSIS — I87311 Chronic venous hypertension (idiopathic) with ulcer of right lower extremity: Secondary | ICD-10-CM | POA: Insufficient documentation

## 2015-02-08 DIAGNOSIS — L97311 Non-pressure chronic ulcer of right ankle limited to breakdown of skin: Secondary | ICD-10-CM | POA: Diagnosis not present

## 2015-02-08 DIAGNOSIS — I83013 Varicose veins of right lower extremity with ulcer of ankle: Secondary | ICD-10-CM | POA: Insufficient documentation

## 2015-02-08 NOTE — Progress Notes (Addendum)
CLINT, BIELLO ([id number]) Visit Report for 02/08/2015 Chief Complaint Document Details Patient Name: Maurice Sanders, Maurice Sanders Date of Service: 02/08/2015 8:00 AM Medical Record Number: [id number] Patient Account Number: [id number] Date of Birth/Sex: Jan 04, 1946 (69 y.o. Male) Treating RN: Primary Care Physician: Beverely Risen Other Clinician: Referring Physician: Beverely Risen Treating Physician/Extender: Rudene Re in Treatment: 30 Information Obtained from: Patient Chief Complaint [name] has been coming for dressing changes twice a week, on Monday and Thursday. Over all he is doing fine he has his blood sugar under control and is managing his diuretics well. after changing over from calamine to zinc oxide he said his own Unna's boot still gave him a bit of itching and problems and he had significant swelling which made the Unna's boot fairly tight. Electronic Signature(s) Signed: 02/08/2015 8:42:18 AM By: Evlyn Kanner MD, FACS Entered By: Evlyn Kanner on 02/08/2015 08:42:18 Maurice Sanders ([id number]) -------------------------------------------------------------------------------- HPI Details Patient Name: Maurice Sanders Date of Service: 02/08/2015 8:00 AM Medical Record Number: [id number] Patient Account Number: [id number] Date of Birth/Sex: 1946-06-25 (69 y.o. Male) Treating RN: Primary Care Physician: Beverely Risen Other Clinician: Referring Physician: Beverely Risen Treating Physician/Extender: Rudene Re in Treatment: 30 History of Present Illness Location: right lower extremity Quality: Patient reports experiencing a dull pain to affected area(s). Severity: patient denies any severe pain. Duration: he has had this wound for a few weeks now. Timing: Pain in wound is Intermittent (comes and goes Context: the patient has a long history of venous insufficiency. He has been very compliant with wearing his compression stockings. Modifying Factors: the patient normally wears  compression stockings. Recently he had placed a fair amount of Vaseline which cause skin irritation. Associated Signs and Symptoms: Patient denies any fevers. He has had a moderate amount of drainage. HPI Description: The patient is 69 year-old male with a history of diabetes who presents with right lower extremity wound. He first presented to our clinic on 07/10/2014. He does have a history of venous insufficiency in the past. He has been very compliant with wearing his compression stockings. He recently Vaseline on his legs which caused skin irritation and breakdown. The patient returns for follow-up today. he denies any fevers at home. However due to the fact that he had stopped his diuretic he has had a lot of edema of the lower extremity. He also has a lot of scaly skin on his right lower extremity. 10/02/14 -- he has resumed his diuretics on a regular basis and has been walking around adequately and has overall improved. No pain or no discharge from the wounds. 10/09/14 -- the patient has been having some pain at night while sleeping and was requesting pain medications. He also says that due to taking his diuretics he's been getting some cramps and I have asked him to please see his PCP regarding both these problems. Also awaiting insurance clearance regarding his Apligraf. addendum: I have now got a clear copy of his vascular workup studies and have reviewed the entire details dated encounter 08/17/2014. the lower extremity venous duplex study done on 08/17/2014 revealed no evidence of deep vein thrombosis or superficial thrombophlebitis bilaterally, no incompetence of the greater small saphenous veins bilaterally, enlarged lymph nodes in the groin bilaterally. As seen by Dr. Gilda Crease on the same day. Dr. Gilda Crease recommended that he should continue wearing graduated compression stockings of class I which is 20-30 mmHg pressure on a daily basis and a prescription was given. He was to be  reassessed in 6 months and the question  of getting him lymphedema pumps were also discussed. 10/16/2014. Vascular note reviewed in detail. I have been able to review his notes from Dr. Gilda CreaseSchnier on 08/17/2014. 1.The venous duplex study done on 08/17/2014 showed that all veins visualized showed no evidence of DVT. 2. no incompetence of the greater small saphenous veins bilaterally. Maurice Sanders, Maurice ([id number]) The plan was that he had no surgery or intervention needed at this point. The long discussion had showed that the patient had chronic skin changes accompanied by venous insufficiency and at this stage he would benefit from wearing graduated compression stockings of the 20-30 mmHg on a daily basis and review would be done in his office back again in 6 months. At that time the patient could have a lymph pump. except for the new abrasion on the right lower extremity just below his tibial tuberosity he is doing fine and his ulcers actually look much better. 10/23/14 -- Culture report on Maurice BayleyAnthony Sanders --- His wound culture has grown Citrobacter koseri and Enterococcus faecalis. Final report is back but so far sensitive to Bactrim and ampicillin and ciprofloxacin. Bactrim DS was called in for 14 days. 11/13/2014 last week we changed his Unna boot on Monday and Thursday and this has helped him a lot. The edema has been much better the Unna boot hasn't slipped down and he has not had any skin abrasions. 11/20/2014 -- he saw the vascular surgeon Dr. Gilda CreaseSchnier this morning and he was pleased with his progress and has said he would be ordering him some lymphedema pumps. Other than that the patient is coming to change his Unna's boots twice a week and he is doing well with this. 11/27/2014 -- over Saturday and Sunday he started having more swelling in his leg and some pain and I believe his own Unna's boot caused him some discomfort and excoriation of skin. He does admit he took some more juices orally  but he has continued his diuretic. 12/11/2014 -- he has completed his course of antibiotics and has had no fresh issues. He has had no pain no swelling and is managing well with his blood sugars and diuretics. 12/14/2014 between Monday and today he has had some dietary indiscretions and had some [place] food which may have made him retain a lot of fluid. His edema has increased markedly and a lot of more excoriation. 12/25/2014 -- This week he has really done well and his edema is under control and these had no pain or problems. His leg is feeling good. 01/04/2015 -- He has done great along the way and he has managed to keep his wrap for almost her entire week. No issues or problems. 02/01/2015 -- he is doing fine and easier for his first application of the Apligraf skin substitute. 02/08/2015 -- he had a lot of drainage to the dressing over the Apligraf and this will be addressed today. Electronic Signature(s) Signed: 02/08/2015 8:42:43 AM By: Evlyn KannerBritto, Melane Windholz MD, FACS Entered By: Evlyn KannerBritto, Aurea Aronov on 02/08/2015 08:42:43 Maurice Sanders, Kalum ([id number]) -------------------------------------------------------------------------------- Physical Exam Details Patient Name: Maurice Sanders, Les Date of Service: 02/08/2015 8:00 AM Medical Record Number: [id number] Patient Account Number: [id number] Date of Birth/Sex: 11/16/1945 [age] y.o. Male) Treating RN: Primary Care Physician: Beverely RisenKHAN, FOZIA Other Clinician: Referring Physician: Beverely RisenKHAN, FOZIA Treating Physician/Extender: Rudene ReBritto, Kaydance Bowie Weeks in Treatment: 30 Constitutional . Pulse regular. Respirations normal and unlabored. Afebrile. . Eyes Nonicteric. Reactive to light. Ears, Nose, Mouth, and Throat Lips, teeth, and gums WNL.Marland Kitchen. Moist mucosa without lesions . Neck supple and nontender. No palpable  supraclavicular or cervical adenopathy. Normal sized without goiter. Respiratory WNL. No retractions.. Cardiovascular Pedal Pulses WNL. No clubbing, cyanosis or  edema. Chest Breasts symmetical and no nipple discharge.. Breast tissue WNL, no masses, lumps, or tenderness.. Musculoskeletal Adexa without tenderness or enlargement.. Digits and nails w/o clubbing, cyanosis, infection, petechiae, ischemia, or inflammatory conditions.. Integumentary (Hair, Skin) No suspicious lesions. No crepitus or fluctuance. No peri-wound warmth or erythema. No masses.Marland Kitchen Psychiatric Judgement and insight Intact.. No evidence of depression, anxiety, or agitation.. Notes the wound looks fairly good and has the usual post-Apligraf look. No debridement will be done today. Electronic Signature(s) Signed: 02/08/2015 8:43:25 AM By: Evlyn Kanner MD, FACS Entered By: Evlyn Kanner on 02/08/2015 08:43:25 Maurice Sanders ([id number]) -------------------------------------------------------------------------------- Physician Orders Details Patient Name: Maurice Sanders Date of Service: 02/08/2015 8:00 AM Medical Record Number: [id number] Patient Account Number: [id number] Date of Birth/Sex: [birth year] (69 y.o. 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 Primary Wound Dressing Wound #2 Right,Proximal,Medial Malleolus o Other: - Mepitel secured by steri-strips Secondary Dressing Wound #2 Right,Proximal,Medial Malleolus o ABD pad o Drawtex Dressing Change Frequency Wound #2 Right,Proximal,Medial Malleolus o  Change dressing every week Follow-up Appointments Wound #2 Right,Proximal,Medial Malleolus o Return Appointment in 1 week. Edema Control o 4-Layer Compression System - Right Lower Extremity Notes Meitel, Drawtex and ABD under 4 layer wrap Electronic Signature(s) Signed: 02/08/2015 12:23:30 PM By: Evlyn Kanner MD, FACS [place], [name] ([id number]) Signed: 02/08/2015 5:13:08 PM By: Elliot Gurney, RN, BSN, Kim RN, BSN Entered By: Elliot Gurney, RN, BSN, Kim on 02/08/2015 08:48:24 Maurice Sanders ([id number]) -------------------------------------------------------------------------------- Problem List Details Patient Name: Maurice Sanders Date of Service: 02/08/2015 8:00 AM Medical Record Number: [id number] Patient Account Number: [id number] Date of Birth/Sex: 07-Mar-1946 (69 y.o. 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 Male) Treating RN: Primary Care Physician: Beverely Risen Other Clinician: Referring Physician: Beverely Risen Treating Physician/Extender: Rudene Re in Treatment:  30 Subjective Chief Complaint Information obtained from Patient Draiden has been coming for dressing changes twice a week, on Monday and Thursday. Over all he is doing fine he has his blood sugar under control and is managing his diuretics well. after changing over from calamine to zinc oxide he said his own Unna's boot still gave him a bit of itching and problems and he had significant swelling which made the Unna's boot fairly tight. History of Present Illness (HPI) The following HPI elements were documented for  the patient's wound: Location: right lower extremity Quality: Patient reports experiencing a dull pain to affected area(s). Severity: patient denies any severe pain. Duration: he has had this wound for a few weeks now. Timing: Pain in wound is Intermittent (comes and goes Context: the patient has a long history of venous insufficiency. He has been very compliant with wearing his compression stockings. Modifying Factors: the patient normally wears compression stockings. Recently he had placed a fair amount of Vaseline which cause skin irritation. Associated Signs and Symptoms: Patient denies any fevers. He has had a moderate amount of drainage. The patient is 69 year-old male with a history of diabetes who presents with right lower extremity wound. He first presented to our clinic on 07/10/2014. He does have a history of venous insufficiency in the past. He has been very compliant with wearing his compression stockings. He recently Vaseline on his legs which caused skin irritation and breakdown. The patient returns for follow-up today. he denies any fevers at home. However due to the fact that he had stopped his diuretic he has had a lot of edema of the lower extremity. He also has a lot of scaly skin on his right lower extremity. 10/02/14 -- he has resumed his diuretics on a regular basis and has been walking around adequately and has overall improved. No pain or no discharge  from the wounds. 10/09/14 -- the patient has been having some pain at night while sleeping and was requesting pain medications. He also says that due to taking his diuretics he's been getting some cramps and I have asked him to please see his PCP regarding both these problems. Also awaiting insurance clearance regarding his Apligraf. addendum: I have now got a clear copy of his vascular workup studies and have reviewed the entire details dated encounter 08/17/2014. the lower extremity venous duplex study done on 08/17/2014 revealed no evidence of deep vein thrombosis or superficial thrombophlebitis bilaterally, no incompetence of the greater small saphenous veins bilaterally, enlarged lymph nodes in the groin bilaterally. LANKFORD, GUTZMER ([id number]) As seen by Dr. Gilda Crease on the same day. Dr. Gilda Crease recommended that he should continue wearing graduated compression stockings of class I which is 20-30 mmHg pressure on a daily basis and a prescription was given. He was to be reassessed in 6 months and the question of getting him lymphedema pumps were also discussed. 10/16/2014. Vascular note reviewed in detail. I have been able to review his notes from Dr. Gilda Crease on 08/17/2014. 1.The venous duplex study done on 08/17/2014 showed that all veins visualized showed no evidence of DVT. 2. no incompetence of the greater small saphenous veins bilaterally. The plan was that he had no surgery or intervention needed at this point. The long discussion had showed that the patient had chronic skin changes accompanied by venous insufficiency and at this stage he would benefit from wearing graduated compression stockings of the 20-30 mmHg on a daily basis and review would be done in his office back again in 6 months. At that time the patient could have a lymph pump. except for the new abrasion on the right lower extremity just below his tibial tuberosity he is doing fine and his ulcers actually look much  better. 10/23/14 -- Culture report on Maurice Sanders --- His wound culture has grown Citrobacter koseri and Enterococcus faecalis. Final report is back but so far sensitive to Bactrim and ampicillin and ciprofloxacin. Bactrim DS was called in for 14 days. 11/13/2014 last week we changed his Radio broadcast assistant  on Monday and Thursday and this has helped him a lot. The edema has been much better the Unna boot hasn't slipped down and he has not had any skin abrasions. 11/20/2014 -- he saw the vascular surgeon Dr. Gilda Crease this morning and he was pleased with his progress and has said he would be ordering him some lymphedema pumps. Other than that the patient is coming to change his Unna's boots twice a week and he is doing well with this. 11/27/2014 -- over Saturday and Sunday he started having more swelling in his leg and some pain and I believe his own Unna's boot caused him some discomfort and excoriation of skin. He does admit he took some more juices orally but he has continued his diuretic. 12/11/2014 -- he has completed his course of antibiotics and has had no fresh issues. He has had no pain no swelling and is managing well with his blood sugars and diuretics. 12/14/2014 between Monday and today he has had some dietary indiscretions and had some [place] food which may have made him retain a lot of fluid. His edema has increased markedly and a lot of more excoriation. 12/25/2014 -- This week he has really done well and his edema is under control and these had no pain or problems. His leg is feeling good. 01/04/2015 -- He has done great along the way and he has managed to keep his wrap for almost her entire week. No issues or problems. 02/01/2015 -- he is doing fine and easier for his first application of the Apligraf skin substitute. 02/08/2015 -- he had a lot of drainage to the dressing over the Apligraf and this will be addressed today. HAIDYN, CHADDERDON ([id number]) Objective Constitutional Pulse  regular. Respirations normal and unlabored. Afebrile. Vitals Time Taken: 8:15 AM, Height: 71 in, Weight: 291.7 lbs, BMI: 40.7, Temperature: 98.1 F, Pulse: 78 bpm, Respiratory Rate: 18 breaths/min, Blood Pressure: 148/78 mmHg. Eyes Nonicteric. Reactive to light. Ears, Nose, Mouth, and Throat Lips, teeth, and gums WNL.Marland Kitchen Moist mucosa without lesions . Neck supple and nontender. No palpable supraclavicular or cervical adenopathy. Normal sized without goiter. Respiratory WNL. No retractions.. Cardiovascular Pedal Pulses WNL. No clubbing, cyanosis or edema. Chest Breasts symmetical and no nipple discharge.. Breast tissue WNL, no masses, lumps, or tenderness.. Musculoskeletal Adexa without tenderness or enlargement.. Digits and nails w/o clubbing, cyanosis, infection, petechiae, ischemia, or inflammatory conditions.Marland Kitchen Psychiatric Judgement and insight Intact.. No evidence of depression, anxiety, or agitation.. General Notes: the wound looks fairly good and has the usual post-Apligraf look. No debridement will be done today. Integumentary (Hair, Skin) No suspicious lesions. No crepitus or fluctuance. No peri-wound warmth or erythema. No masses.. Wound #2 status is Open. Original cause of wound was Gradually Appeared. The wound is located on the Right,Proximal,Medial Malleolus. The wound measures 3cm length x 2cm width x 0.2cm depth; 4.712cm^2 area and 0.942cm^3 volume. The wound is limited to skin breakdown. There is a medium amount of [place], Jaycee ([id number]) serosanguineous drainage noted. The wound margin is distinct with the outline attached to the wound base. There is medium (34-66%) red, pink granulation within the wound bed. There is a medium (34-66%) amount of necrotic tissue within the wound bed including Adherent Slough. The periwound skin appearance exhibited: Scarring, Dry/Scaly, Moist, Hemosiderin Staining. The periwound skin appearance did not exhibit: Callus, Crepitus,  Excoriation, Fluctuance, Friable, Induration, Localized Edema, Rash, Maceration, Atrophie Blanche, Cyanosis, Ecchymosis, Mottled, Pallor, Rubor, Erythema. Periwound temperature was noted as No Abnormality. 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 will then have his Profore compression and come back and see me next week for an application of Apligraf. Plan Wound Cleansing: Wound #2 Right,Proximal,Medial Malleolus: Clean wound with Normal Saline. Primary Wound Dressing: Wound #2 Right,Proximal,Medial Malleolus: Other: - Mepitel secured by steri-strips Secondary Dressing: Wound #2 Right,Proximal,Medial Malleolus: ABD pad Drawtex Dressing Change Frequency: Wound #2 Right,Proximal,Medial Malleolus: Change dressing every week Follow-up Appointments: Wound #2 Right,Proximal,Medial Malleolus: Return Appointment in 1 week. Edema Control: 4-Layer Compression System - Right Lower Extremity HASSON, GASPARD ([id number]) General Notes: Meitel, Drawtex and ABD under 4 layer wrap I have recommended Mepitel with Steri-Strips and a drawTex and appropriate padding over this. will then have his Profore compression and come back and see me next week for an application of Apligraf. Electronic Signature(s) Signed: 02/12/2015 12:43:37 PM By: Evlyn Kanner MD, FACS Previous Signature: 02/08/2015 8:44:18 AM Version By: Evlyn Kanner MD, FACS Entered By: Evlyn Kanner on 02/12/2015 12:43:37 Maurice Sanders ([id number]) -------------------------------------------------------------------------------- SuperBill Details Patient Name: Maurice Sanders Date of Service: 02/08/2015 Medical Record Number:  [id number] Patient Account Number: [id number] Date of Birth/Sex: 1946/06/18 (69 y.o. 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 ulcer of ankle Facility Procedures CPT4: Description Modifier Quantity Code [id number] (Facility Use Only) [phone] - APPLY MULTLAY COMPRS LWR RT 1 LEG Physician Procedures CPT4 Code Description: [id number] - WC PHYS LEVEL 3 - EST PT ICD-10 Description Diagnosis 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

## 2015-02-09 NOTE — Progress Notes (Signed)
ATARI, NOVICK ([id number]) Visit Report for 02/08/2015 Arrival Information Details Patient Name: Maurice Sanders, Maurice Sanders Date of Service: 02/08/2015 8:00 AM Medical Record Number: [id number] Patient Account Number: [id number] Date of Birth/Sex: [birth year] (69 y.o. Male) Treating RN: Huel Coventry Primary Care Physician: Beverely Risen Other Clinician: Referring Physician: Beverely Risen Treating Physician/Extender: Rudene Re in Treatment: 30 Visit Information History Since Last Visit Added or deleted any medications: No Patient Arrived: Ambulatory Any new allergies or adverse reactions: No Arrival Time: 08:09 Had a fall or experienced change in No Accompanied By: self activities of daily living that may affect Transfer Assistance: None risk of falls: Patient Identification Verified: Yes Signs or symptoms of abuse/neglect since last No Secondary Verification Process Yes visito Completed: Hospitalized since last visit: No Patient Requires Transmission- No Has Dressing in Place as Prescribed: Yes Based Precautions: Has Compression in Place as Prescribed: Yes Patient Has Alerts: Yes Pain Present Now: No Patient Alerts: Patient on Blood Thinner DM II ABI L 1.06; R 1.13 Electronic Signature(s) Signed: 02/08/2015 5:13:08 PM By: Elliot Gurney, RN, BSN, Kim RN, BSN Entered By: Elliot Gurney, RN, BSN, Kim on 02/08/2015 08:20:56 Maurice Sanders ([id number]) -------------------------------------------------------------------------------- Encounter Discharge Information Details Patient Name: Maurice Sanders Date of Service: 02/08/2015 8:00 AM Medical Record Number: [id number] Patient Account Number: [id number] Date of Birth/Sex: Oct 28, 1945 (69 y.o. Male) Treating RN: Huel Coventry Primary Care Physician: Beverely Risen Other Clinician: Referring Physician: Beverely Risen Treating Physician/Extender: Rudene Re in Treatment: 30 Encounter Discharge Information Items Discharge Pain Level: 0 Discharge Condition:  Stable Ambulatory Status: Ambulatory Discharge Destination: Home Private Transportation: Auto Accompanied By: self Schedule Follow-up Appointment: Yes Medication Reconciliation completed and Yes provided to Patient/Care Edwing Figley: Clinical Summary of Care: Electronic Signature(s) Signed: 02/08/2015 5:13:08 PM By: Elliot Gurney, RN, BSN, Kim RN, BSN Entered By: Elliot Gurney, RN, BSN, Kim on 02/08/2015 08:50:07 Maurice Sanders ([id number]) -------------------------------------------------------------------------------- Lower Extremity Assessment Details Patient Name: Maurice Sanders Date of Service: 02/08/2015 8:00 AM Medical Record Number: [id number] Patient Account Number: [id number] Date of Birth/Sex: 05-28-1946 (69 y.o. Male) Treating RN: Huel Coventry Primary Care Physician: Beverely Risen Other Clinician: Referring Physician: Beverely Risen Treating Physician/Extender: Rudene Re in Treatment: 30 Edema Assessment Assessed: [Left: No] [Right: No] E[Left: dema] [Right: :] Calf Left: Right: Point of Measurement: 33 cm From Medial Instep cm 37.2 cm Ankle Left: Right: Point of Measurement: 11 cm From Medial Instep cm 25.5 cm Vascular Assessment Pulses: Posterior Tibial Dorsalis Pedis Palpable: [Right:Yes] Extremity colors, hair growth, and conditions: Extremity Color: [Right:Hyperpigmented] Hair Growth on Extremity: [Right:No] Temperature of Extremity: [Right:Warm] Capillary Refill: [Right:< 3 seconds] Toe Nail Assessment Left: Right: Thick: Yes Discolored: Yes Deformed: Yes Improper Length and Hygiene: No Electronic Signature(s) Signed: 02/08/2015 5:13:08 PM By: Elliot Gurney, RN, BSN, Kim RN, BSN Entered By: Elliot Gurney, RN, BSN, Kim on 02/08/2015 08:22:43 Maurice Sanders ([id number]) -------------------------------------------------------------------------------- Multi Wound Chart Details Patient Name: Maurice Sanders Date of Service: 02/08/2015 8:00 AM Medical Record Number: [id number] Patient  Account Number: [id number] Date of Birth/Sex: October 16, 1945 (69 y.o. Male) Treating RN: Huel Coventry Primary Care Physician: Beverely Risen Other Clinician: Referring Physician: Beverely Risen Treating Physician/Extender: Rudene Re in Treatment: 30 Vital Signs Height(in): 71 Pulse(bpm): 78 Weight(lbs): 291.7 Blood Pressure 148/78 (mmHg): Body Mass Index(BMI): 41 Temperature(F): 98.1 Respiratory Rate 18 (breaths/min): Photos: [2:No Photos] [N/A:N/A] Wound Location: [2:Right Malleolus - Medial, Proximal] [N/A:N/A] Wounding Event: [2:Gradually Appeared] [N/A:N/A] Primary Etiology: [2:Venous Leg Ulcer] [N/A:N/A] Comorbid History: [2:Sleep Apnea, Congestive Heart Failure, Deep Vein Thrombosis, Hypertension, Type II Diabetes, Gout] [N/A:N/A] Date Acquired: [2:07/31/2014] [N/A:N/A] Weeks  of Treatment: [2:27] [N/A:N/A] Wound Status: [2:Open] [N/A:N/A] Measurements L x W x D 3x2x0.2 [N/A:N/A] (cm) Area (cm) : [2:4.712] [N/A:N/A] Volume (cm) : [2:0.942] [N/A:N/A] % Reduction in Area: [2:-566.50%] [N/A:N/A] % Reduction in Volume: -1226.80% [N/A:N/A] Classification: [2:Full Thickness Without Exposed Support Structures] [N/A:N/A] HBO Classification: [2:Grade 1] [N/A:N/A] Exudate Amount: [2:Medium] [N/A:N/A] Exudate Type: [2:Serosanguineous] [N/A:N/A] Exudate Color: [2:red, brown] [N/A:N/A] Wound Margin: [2:Distinct, outline attached] [N/A:N/A] Granulation Amount: [2:Medium (34-66%)] [N/A:N/A] Granulation Quality: [2:Red, Pink] [N/A:N/A] Necrotic Amount: [2:Medium (34-66%)] [N/A:N/A] Exposed Structures: Fascia: No N/A N/A Fat: No Tendon: No Muscle: No Joint: No Bone: No Limited to Skin Breakdown Epithelialization: Medium (34-66%) N/A N/A Periwound Skin Texture: Scarring: Yes N/A N/A Edema: No Excoriation: No Induration: No Callus: No Crepitus: No Fluctuance: No Friable: No Rash: No Periwound Skin Moist: Yes N/A N/A Moisture: Dry/Scaly: Yes Maceration: No Periwound  Skin Color: Hemosiderin Staining: Yes N/A N/A Atrophie Blanche: No Cyanosis: No Ecchymosis: No Erythema: No Mottled: No Pallor: No Rubor: No Temperature: No Abnormality N/A N/A Tenderness on No N/A N/A Palpation: Wound Preparation: Ulcer Cleansing: N/A N/A Rinsed/Irrigated with Saline, Other: water and soap Topical Anesthetic Applied: Other: lidocaine 4% Treatment Notes Electronic Signature(s) Signed: 02/08/2015 5:13:08 PM By: Elliot Gurney, RN, BSN, Kim RN, BSN Entered By: Elliot Gurney, RN, BSN, Kim on 02/08/2015 08:46:12 Maurice Sanders ([id number]) -------------------------------------------------------------------------------- Multi-Disciplinary Care Plan Details Patient Name: Maurice Sanders Date of Service: 02/08/2015 8:00 AM Medical Record Number: [id number] Patient Account Number: [id number] Date of Birth/Sex: 07-Mar-1946 (69 y.o. Male) Treating RN: Huel Coventry Primary Care Physician: Beverely Risen Other Clinician: Referring Physician: Beverely Risen Treating Physician/Extender: Rudene Re in Treatment: 30 Active Inactive Abuse / Safety / Falls / Self Care Management Nursing Diagnoses: Potential for falls Goals: Patient will remain injury free Date Initiated: 07/10/2014 Goal Status: Active Interventions: Assess fall risk on admission and as needed Notes: Nutrition Nursing Diagnoses: Potential for alteratiion in Nutrition/Potential for imbalanced nutrition Goals: Patient/caregiver agrees to and verbalizes understanding of need to use nutritional supplements and/or vitamins as prescribed Date Initiated: 07/10/2014 Goal Status: Active Interventions: Assess patient nutrition upon admission and as needed per policy Notes: Orientation to the Wound Care Program Nursing Diagnoses: Knowledge deficit related to the wound healing center program Goals: Patient/caregiver will verbalize understanding of the Wound Healing Center Program [place], [place] ([id number]) Date Initiated:  07/10/2014 Goal Status: Active Interventions: Provide education on orientation to the wound center Notes: Wound/Skin Impairment Nursing Diagnoses: Impaired tissue integrity Goals: Ulcer/skin breakdown will have a volume reduction of 30% by week 4 Date Initiated: 07/10/2014 Goal Status: Active Interventions: Assess ulceration(s) every visit Notes: Electronic Signature(s) Signed: 02/08/2015 5:13:08 PM By: Elliot Gurney, RN, BSN, Kim RN, BSN Entered By: Elliot Gurney, RN, BSN, Kim on 02/08/2015 08:46:03 Maurice Sanders ([id number]) -------------------------------------------------------------------------------- Pain Assessment Details Patient Name: Maurice Sanders Date of Service: 02/08/2015 8:00 AM Medical Record Number: [id number] Patient Account Number: [id number] Date of Birth/Sex: [birth year] (69 y.o. Male) Treating RN: Huel Coventry Primary Care Physician: Beverely Risen Other Clinician: Referring Physician: Beverely Risen Treating Physician/Extender: Rudene Re in Treatment: 30 Active Problems Location of Pain Severity and Description of Pain Patient Has Paino No Site Locations Pain Management and Medication Current Pain Management: Electronic Signature(s) Signed: 02/08/2015 5:13:08 PM By: Elliot Gurney, RN, BSN, Kim RN, BSN Entered By: Elliot Gurney, RN, BSN, Kim on 02/08/2015 08:21:02 Maurice Sanders ([id number]) -------------------------------------------------------------------------------- Patient/Caregiver Education Details Patient Name: Maurice Sanders Date of Service: 02/08/2015 8:00 AM Medical Record Number: [id number] Patient Account Number: [id number] Date of Birth/Gender: July 16, 1946 (69 y.o. Male) Treating RN: Elliot Gurney,  Kim Primary Care Physician: Beverely RisenKHAN, FOZIA Other Clinician: Referring Physician: Beverely RisenKHAN, FOZIA Treating Physician/Extender: Rudene ReBritto, Errol Weeks in Treatment: 30 Education Assessment Education Provided To: Patient Education Topics Provided Venous: Handouts: Controlling Swelling with  Multilayered Compression Wraps, Other: Do not get wet Electronic Signature(s) Signed: 02/08/2015 5:13:08 PM By: Elliot GurneyWoody, RN, BSN, Kim RN, BSN Entered By: Elliot GurneyWoody, RN, BSN, Kim on 02/08/2015 08:50:34 Maurice BayleySILER, Juel ([id number]) -------------------------------------------------------------------------------- Wound Assessment Details Patient Name: Maurice BayleySILER, [place] Date of Service: 02/08/2015 8:00 AM Medical Record Number: [id number] Patient Account Number: [id number] Date of Birth/Sex: 04/25/1946 16(69 y.o. Male) Treating RN: Huel CoventryWoody, Kim Primary Care Physician: Beverely RisenKHAN, FOZIA Other Clinician: Referring Physician: Beverely RisenKHAN, FOZIA Treating Physician/Extender: Rudene ReBritto, Errol Weeks in Treatment: 30 Wound Status Wound Number: 2 Primary Venous Leg Ulcer Etiology: Wound Location: Right Malleolus - Medial, Proximal Wound Open Status: Wounding Event: Gradually Appeared Comorbid Sleep Apnea, Congestive Heart Failure, Date Acquired: 07/31/2014 History: Deep Vein Thrombosis, Hypertension, Weeks Of Treatment: 27 Type II Diabetes, Gout Clustered Wound: No Photos Photo Uploaded By: Elliot GurneyWoody, RN, BSN, Kim on 02/08/2015 14:08:15 Wound Measurements Length: (cm) 3 Width: (cm) 2 Depth: (cm) 0.2 Area: (cm) 4.712 Volume: (cm) 0.942 % Reduction in Area: -566.5% % Reduction in Volume: -1226.8% Epithelialization: Medium (34-66%) Wound Description Full Thickness Without Exposed Foul Odor A Classification: Support Structures Diabetic Severity Grade 1 (Wagner): Wound Margin: Distinct, outline attached Exudate Amount: Medium Exudate Type: Serosanguineous Exudate Color: red, brown fter Cleansing: No Wound Bed Granulation Amount: Medium (34-66%) Exposed Structure Soderlund, Jacen ([id number]) Granulation Quality: Red, Pink Fascia Exposed: No Necrotic Amount: Medium (34-66%) Fat Layer Exposed: No Necrotic Quality: Adherent Slough Tendon Exposed: No Muscle Exposed: No Joint Exposed: No Bone Exposed: No Limited to  Skin Breakdown Periwound Skin Texture Texture Color No Abnormalities Noted: No No Abnormalities Noted: No Callus: No Atrophie Blanche: No Crepitus: No Cyanosis: No Excoriation: No Ecchymosis: No Fluctuance: No Erythema: No Friable: No Hemosiderin Staining: Yes Induration: No Mottled: No Localized Edema: No Pallor: No Rash: No Rubor: No Scarring: Yes Temperature / Pain Moisture Temperature: No Abnormality No Abnormalities Noted: No Dry / Scaly: Yes Maceration: No Moist: Yes Wound Preparation Ulcer Cleansing: Rinsed/Irrigated with Saline, Other: water and soap, Topical Anesthetic Applied: Other: lidocaine 4%, Treatment Notes Wound #2 (Right, Proximal, Medial Malleolus) 1. Cleansed with: Clean wound with Normal Saline 2. Anesthetic Topical Lidocaine 4% cream to wound bed prior to debridement 4. Dressing Applied: Other dressing (specify in notes) 7. Secured with 4-Layer Compression System - Right Lower Extremity Notes contact layer applied on non wounded areas. mepitel secured with steri-strips, drawtex, ABD, under 4 layer wrap Electronic Signature(s) Signed: 02/08/2015 5:13:08 PM By: Elliot GurneyWoody, RN, BSN, Kim RN, BSN [place]IL[place], [place] ([id number]) Entered By: Elliot GurneyWoody, RN, BSN, Kim on 02/08/2015 08:45:50 Maurice BayleySILER, Sanjit ([id number]) -------------------------------------------------------------------------------- Vitals Details Patient Name: Maurice BayleySILER, [place] Date of Service: 02/08/2015 8:00 AM Medical Record Number: [id number] Patient Account Number: [id number] Date of Birth/Sex: 06/05/1946 34(69 y.o. Male) Treating RN: Huel CoventryWoody, Kim Primary Care Physician: Beverely RisenKHAN, FOZIA Other Clinician: Referring Physician: Beverely RisenKHAN, FOZIA Treating Physician/Extender: Rudene ReBritto, Errol Weeks in Treatment: 30 Vital Signs Time Taken: 08:15 Temperature (F): 98.1 Height (in): 71 Pulse (bpm): 78 Weight (lbs): 291.7 Respiratory Rate (breaths/min): 18 Body Mass Index (BMI): 40.7 Blood Pressure (mmHg):  148/78 Reference Range: 80 - 120 mg / dl Electronic Signature(s) Signed: 02/08/2015 5:13:08 PM By: Elliot GurneyWoody, RN, BSN, Kim RN, BSN Entered By: Elliot GurneyWoody, RN, BSN, Kim on 02/08/2015 53:[id number]:4408:21:21

## 2015-02-12 DIAGNOSIS — Z0001 Encounter for general adult medical examination with abnormal findings: Secondary | ICD-10-CM | POA: Diagnosis not present

## 2015-02-12 DIAGNOSIS — E119 Type 2 diabetes mellitus without complications: Secondary | ICD-10-CM | POA: Diagnosis not present

## 2015-02-12 DIAGNOSIS — E782 Mixed hyperlipidemia: Secondary | ICD-10-CM | POA: Diagnosis not present

## 2015-02-15 ENCOUNTER — Encounter: Payer: Medicare Other | Admitting: Surgery

## 2015-02-15 DIAGNOSIS — E11620 Type 2 diabetes mellitus with diabetic dermatitis: Secondary | ICD-10-CM | POA: Diagnosis not present

## 2015-02-15 DIAGNOSIS — I83013 Varicose veins of right lower extremity with ulcer of ankle: Secondary | ICD-10-CM | POA: Diagnosis not present

## 2015-02-15 DIAGNOSIS — I87311 Chronic venous hypertension (idiopathic) with ulcer of right lower extremity: Secondary | ICD-10-CM | POA: Diagnosis not present

## 2015-02-15 DIAGNOSIS — L97311 Non-pressure chronic ulcer of right ankle limited to breakdown of skin: Secondary | ICD-10-CM | POA: Diagnosis not present

## 2015-02-15 NOTE — Progress Notes (Addendum)
RITA, PROM ([id number]) Visit Report for 02/15/2015 Chief Complaint Document Details Patient Name: Maurice Sanders, Maurice Sanders Date of Service: 02/15/2015 8:00 AM Medical Record Number: [id number] Patient Account Number: [id number] Date of Birth/Sex: 21-May-1946 (69 y.o. Male) Treating RN: Primary Care Physician: Beverely Risen Other Clinician: Referring Physician: Beverely Risen Treating Physician/Extender: Rudene Re in Treatment: 31 Information Obtained from: Patient Chief Complaint Varun has been coming for dressing changes twice a week, on Monday and Thursday. Over all he is doing fine he has his blood sugar under control and is managing his diuretics well. after changing over from calamine to zinc oxide he said his own Unna's boot still gave him a bit of itching and problems and he had significant swelling which made the Unna's boot fairly tight. Electronic Signature(s) Signed: 02/15/2015 9:04:43 AM By: Evlyn Kanner MD, FACS Entered By: Evlyn Kanner on 02/15/2015 09:04:42 Maurice Sanders ([id number]) -------------------------------------------------------------------------------- Cellular or Tissue Based Product Details Patient Name: Maurice Sanders Date of Service: 02/15/2015 8:00 AM Medical Record Number: [id number] Patient Account Number: [id number] Date of Birth/Sex: 1946-03-01 (69 y.o. Male) Treating RN: Primary Care Physician: Beverely Risen Other Clinician: Referring Physician: Beverely Risen Treating Physician/Extender: Rudene Re in Treatment: 31 Cellular or Tissue Based Wound #2 Right,Proximal,Medial Malleolus Product Type Applied to: Performed By: Physician Tristan Schroeder., MD Cellular or Tissue Based Apligraf Product Type: Time-Out Taken: Yes Location: trunk / arms / legs Wound Size (sq cm): 4.4 Product Size (sq cm): 44 Waste Size (sq cm): 39 Waste Reason: wound sizs Amount of Product Applied (sq cm): 5 Lot #: [id number] Expiration Date:  02/22/2015 Fenestrated: Yes Instrument: Blade Reconstituted: Yes Solution Type: saline Solution Amount: 3ml Lot #: [id number] Solution Expiration 10/02/2016 Date: Secured: Yes Secured With: Steri-Strips Dressing Applied: Yes Primary Dressing: mepitel Procedural Pain: 0 Post Procedural Pain: 0 Response to Treatment: Procedure was tolerated well Electronic Signature(s) Signed: 02/15/2015 9:04:32 AM By: Evlyn Kanner MD, FACS Entered By: Evlyn Kanner on 02/15/2015 09:04:32 Maurice Sanders ([id number]) -------------------------------------------------------------------------------- HPI Details Patient Name: Maurice Sanders Date of Service: 02/15/2015 8:00 AM Medical Record Number: [id number] Patient Account Number: [id number] Date of Birth/Sex: Jan 15, 1946 (69 y.o. Male) Treating RN: Primary Care Physician: Beverely Risen Other Clinician: Referring Physician: Beverely Risen Treating Physician/Extender: Rudene Re in Treatment: 31 History of Present Illness Location: right lower extremity Quality: Patient reports experiencing a dull pain to affected area(s). Severity: patient denies any severe pain. Duration: he has had this wound for a few weeks now. Timing: Pain in wound is Intermittent (comes and goes Context: the patient has a long history of venous insufficiency. He has been very compliant with wearing his compression stockings. Modifying Factors: the patient normally wears compression stockings. Recently he had placed a fair amount of Vaseline which cause skin irritation. Associated Signs and Symptoms: Patient denies any fevers. He has had a moderate amount of drainage. HPI Description: The patient is 69 year-old male with a history of diabetes who presents with right lower extremity wound. He first presented to our clinic on 07/10/2014. He does have a history of venous insufficiency in the past. He has been very compliant with wearing his compression stockings. He recently Vaseline  on his legs which caused skin irritation and breakdown. The patient returns for follow-up today. he denies any fevers at home. However due to the fact that he had stopped his diuretic he has had a lot of edema of the lower extremity. He also has a lot of scaly skin on his right lower extremity. 10/02/14 -- he  has resumed his diuretics on a regular basis and has been walking around adequately and has overall improved. No pain or no discharge from the wounds. 10/09/14 -- the patient has been having some pain at night while sleeping and was requesting pain medications. He also says that due to taking his diuretics he's been getting some cramps and I have asked him to please see his PCP regarding both these problems. Also awaiting insurance clearance regarding his Apligraf. addendum: I have now got a clear copy of his vascular workup studies and have reviewed the entire details dated encounter 08/17/2014. the lower extremity venous duplex study done on 08/17/2014 revealed no evidence of deep vein thrombosis or superficial thrombophlebitis bilaterally, no incompetence of the greater small saphenous veins bilaterally, enlarged lymph nodes in the groin bilaterally. As seen by Dr. Gilda Crease on the same day. Dr. Gilda Crease recommended that he should continue wearing graduated compression stockings of class I which is 20-30 mmHg pressure on a daily basis and a prescription was given. He was to be reassessed in 6 months and the question of getting him lymphedema pumps were also discussed. 10/16/2014. Vascular note reviewed in detail. I have been able to review his notes from Dr. Gilda Crease on 08/17/2014. 1.The venous duplex study done on 08/17/2014 showed that all veins visualized showed no evidence of DVT. 2. no incompetence of the greater small saphenous veins bilaterally. Maurice Sanders, Maurice Sanders ([id number]) The plan was that he had no surgery or intervention needed at this point. The long discussion had showed that  the patient had chronic skin changes accompanied by venous insufficiency and at this stage he would benefit from wearing graduated compression stockings of the 20-30 mmHg on a daily basis and review would be done in his office back again in 6 months. At that time the patient could have a lymph pump. except for the new abrasion on the right lower extremity just below his tibial tuberosity he is doing fine and his ulcers actually look much better. 10/23/14 -- Culture report on Maurice Sanders --- His wound culture has grown Citrobacter koseri and Enterococcus faecalis. Final report is back but so far sensitive to Bactrim and ampicillin and ciprofloxacin. Bactrim DS was called in for 14 days. 11/13/2014 last week we changed his Unna boot on Monday and Thursday and this has helped him a lot. The edema has been much better the Unna boot hasn't slipped down and he has not had any skin abrasions. 11/20/2014 -- he saw the vascular surgeon Dr. Gilda Crease this morning and he was pleased with his progress and has said he would be ordering him some lymphedema pumps. Other than that the patient is coming to change his Unna's boots twice a week and he is doing well with this. 11/27/2014 -- over Saturday and Sunday he started having more swelling in his leg and some pain and I believe his own Unna's boot caused him some discomfort and excoriation of skin. He does admit he took some more juices orally but he has continued his diuretic. 12/11/2014 -- he has completed his course of antibiotics and has had no fresh issues. He has had no pain no swelling and is managing well with his blood sugars and diuretics. 12/14/2014 between Monday and today he has had some dietary indiscretions and had some [place] food which may have made him retain a lot of fluid. His edema has increased markedly and a lot of more excoriation. 12/25/2014 -- This week he has really done well and his edema  is under control and these had no pain  or problems. His leg is feeling good. 01/04/2015 -- He has done great along the way and he has managed to keep his wrap for almost her entire week. No issues or problems. 02/01/2015 -- he is doing fine and easier for his first application of the Apligraf skin substitute. 02/08/2015 -- he had a lot of drainage to the dressing over the Apligraf and this will be addressed today. 02/15/2015 -- his wound looks very good and there was not much of drainage. He will have a second application of Apligraf. Electronic Signature(s) Signed: 02/15/2015 9:05:16 AM By: Evlyn Kanner MD, FACS Entered By: Evlyn Kanner on 02/15/2015 09:05:16 Maurice Sanders ([id number]) -------------------------------------------------------------------------------- Physical Exam Details Patient Name: Maurice Sanders Date of Service: 02/15/2015 8:00 AM Medical Record Number: [id number] Patient Account Number: [id number] Date of Birth/Sex: February 12, 1946 (69 y.o. Male) Treating RN: Primary Care Physician: Beverely Risen Other Clinician: Referring Physician: Beverely Risen Treating Physician/Extender: Rudene Re in Treatment: 31 Constitutional . Pulse regular. Respirations normal and unlabored. Afebrile. . Eyes Nonicteric. Reactive to light. Ears, Nose, Mouth, and Throat Lips, teeth, and gums WNL.Marland Kitchen Moist mucosa without lesions . Neck supple and nontender. No palpable supraclavicular or cervical adenopathy. Normal sized without goiter. Respiratory WNL. No retractions.. Cardiovascular Pedal Pulses WNL. No clubbing, cyanosis or edema. Chest Breasts symmetical and no nipple discharge.. Breast tissue WNL, no masses, lumps, or tenderness.. Musculoskeletal Adexa without tenderness or enlargement.. Digits and nails w/o clubbing, cyanosis, infection, petechiae, ischemia, or inflammatory conditions.. Integumentary (Hair, Skin) No suspicious lesions. No crepitus or fluctuance. No peri-wound warmth or erythema. No  masses.Marland Kitchen Psychiatric Judgement and insight Intact.. No evidence of depression, anxiety, or agitation.. Notes the wound looks excellent grown much smaller and he will have second application of Apligraf Electronic Signature(s) Signed: 02/15/2015 9:05:49 AM By: Evlyn Kanner MD, FACS Entered By: Evlyn Kanner on 02/15/2015 09:05:49 Maurice Sanders ([id number]) -------------------------------------------------------------------------------- Physician Orders Details Patient Name: Maurice Sanders Date of Service: 02/15/2015 8:00 AM Medical Record Number: [id number] Patient Account Number: [id number] Date of Birth/Sex: 1946/06/23 (69 y.o. 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 Primary Wound Dressing Wound #2 Right,Proximal,Medial Malleolus o Other: - Mepitel secured by steri-strips - apligraf applied by Dr Meyer Russel today - do not remove below meptiel next week Secondary Dressing Wound #2 Right,Proximal,Medial Malleolus o Drawtex Dressing Change Frequency Wound #2 Right,Proximal,Medial Malleolus o Change dressing every week Follow-up Appointments Wound #2 Right,Proximal,Medial Malleolus o Return Appointment in 1 week. Edema Control o 4-Layer Compression System - Right Lower Extremity Electronic Signature(s) Signed: 02/15/2015 9:14:33 AM By: Curtis Sites Signed: 02/15/2015 12:22:30 PM By: Evlyn Kanner MD,  FACS Entered By: Curtis Sites on 02/15/2015 09:14:33 Maurice Sanders, Maurice Sanders ([id number]) Maurice Sanders, Maurice Sanders ([id number]) -------------------------------------------------------------------------------- Problem List Details Patient Name: Maurice Sanders Date of Service: 02/15/2015 8:00 AM Medical Record Number: [id number] Patient Account Number: [id number] Date of Birth/Sex: 1945/11/19 (69 y.o. 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 Male) Treating RN: Primary Care Physician: Beverely Risen Other Clinician: Referring Physician: Beverely Risen Treating Physician/Extender: Rudene Re in Treatment: 31 Subjective Chief Complaint Information obtained from Patient Levin has been coming for dressing changes twice a week, on Monday and Thursday. Over all he is doing fine he has his blood sugar under control and is managing his diuretics well. after changing over from calamine to zinc oxide he said his own Unna's boot still gave him a bit of itching and problems and he had significant  swelling which made the Unna's boot fairly tight. History of Present Illness (HPI) The following HPI elements were documented for the patient's wound: Location: right lower extremity Quality: Patient reports experiencing a dull pain to affected area(s). Severity: patient denies any severe pain. Duration: he has had this wound for a few weeks now. Timing: Pain in wound is Intermittent (comes and goes Context: the patient has a long history of venous insufficiency. He has been very compliant with wearing his compression stockings. Modifying Factors: the patient normally wears compression stockings. Recently he had placed a fair amount of Vaseline which cause skin irritation. Associated Signs and Symptoms: Patient denies any fevers. He has had a moderate amount of drainage. The patient is 69 year-old male with a history of diabetes who presents with right lower extremity wound. He first presented to our clinic on 07/10/2014. He does have a history of venous insufficiency in the past. He has been very compliant with wearing his compression stockings. He recently Vaseline on his legs which caused skin irritation and breakdown. The patient returns for follow-up today. he denies any fevers at home. However due to the fact that he had stopped his diuretic he has had a lot of edema of the lower extremity. He also has a lot of scaly skin on his right lower extremity. 10/02/14 -- he has resumed his diuretics on a regular basis and has been walking around adequately and has overall improved. No pain or no discharge from the wounds. 10/09/14 -- the patient has been having some pain at night while sleeping and was requesting pain medications. He also says that due to taking his diuretics he's been getting some cramps and I have asked him to please see his PCP regarding both these problems. Also awaiting insurance clearance regarding his Apligraf. addendum: I have now got a clear copy of his vascular workup  studies and have reviewed the entire details dated encounter 08/17/2014. the lower extremity venous duplex study done on 08/17/2014 revealed no evidence of deep vein thrombosis or superficial thrombophlebitis bilaterally, no incompetence of the greater small saphenous veins bilaterally, enlarged lymph nodes in the groin bilaterally. Maurice Sanders, Maurice Sanders ([id number]) As seen by Dr. Gilda Crease on the same day. Dr. Gilda Crease recommended that he should continue wearing graduated compression stockings of class I which is 20-30 mmHg pressure on a daily basis and a prescription was given. He was to be reassessed in 6 months and the  question of getting him lymphedema pumps were also discussed. 10/16/2014. Vascular note reviewed in detail. I have been able to review his notes from Dr. Gilda CreaseSchnier on 08/17/2014. 1.The venous duplex study done on 08/17/2014 showed that all veins visualized showed no evidence of DVT. 2. no incompetence of the greater small saphenous veins bilaterally. The plan was that he had no surgery or intervention needed at this point. The long discussion had showed that the patient had chronic skin changes accompanied by venous insufficiency and at this stage he would benefit from wearing graduated compression stockings of the 20-30 mmHg on a daily basis and review would be done in his office back again in 6 months. At that time the patient could have a lymph pump. except for the new abrasion on the right lower extremity just below his tibial tuberosity he is doing fine and his ulcers actually look much better. 10/23/14 -- Culture report on Maurice BayleyAnthony Sanders --- His wound culture has grown Citrobacter koseri and Enterococcus faecalis. Final report is back but so far sensitive to Bactrim and ampicillin and ciprofloxacin. Bactrim DS was called in for 14 days. 11/13/2014 last week we changed his Unna boot on Monday and Thursday and this has helped him a lot. The edema has been much better the Unna boot  hasn't slipped down and he has not had any skin abrasions. 11/20/2014 -- he saw the vascular surgeon Dr. Gilda CreaseSchnier this morning and he was pleased with his progress and has said he would be ordering him some lymphedema pumps. Other than that the patient is coming to change his Unna's boots twice a week and he is doing well with this. 11/27/2014 -- over Saturday and Sunday he started having more swelling in his leg and some pain and I believe his own Unna's boot caused him some discomfort and excoriation of skin. He does admit he took some more juices orally but he has continued his diuretic. 12/11/2014 -- he has completed his course of antibiotics and has had no fresh issues. He has had no pain no swelling and is managing well with his blood sugars and diuretics. 12/14/2014 between Monday and today he has had some dietary indiscretions and had some [place] food which may have made him retain a lot of fluid. His edema has increased markedly and a lot of more excoriation. 12/25/2014 -- This week he has really done well and his edema is under control and these had no pain or problems. His leg is feeling good. 01/04/2015 -- He has done great along the way and he has managed to keep his wrap for almost her entire week. No issues or problems. 02/01/2015 -- he is doing fine and easier for his first application of the Apligraf skin substitute. 02/08/2015 -- he had a lot of drainage to the dressing over the Apligraf and this will be addressed today. Maurice BayleySILER, Maurice Sanders ([id number]) 02/15/2015 -- his wound looks very good and there was not much of drainage. He will have a second application of Apligraf. Objective Constitutional Pulse regular. Respirations normal and unlabored. Afebrile. Vitals Time Taken: 8:13 AM, Height: 71 in, Weight: 291.7 lbs, BMI: 40.7, Temperature: 98.3 F, Pulse: 60 bpm, Respiratory Rate: 18 breaths/min, Blood Pressure: 153/54 mmHg. Eyes Nonicteric. Reactive to light. Ears, Nose,  Mouth, and Throat Lips, teeth, and gums WNL.Marland Kitchen. Moist mucosa without lesions . Neck supple and nontender. No palpable supraclavicular or cervical adenopathy. Normal sized without goiter. Respiratory WNL. No retractions.. Cardiovascular Pedal Pulses WNL. No clubbing, cyanosis or edema.  Chest Breasts symmetical and no nipple discharge.. Breast tissue WNL, no masses, lumps, or tenderness.. Musculoskeletal Adexa without tenderness or enlargement.. Digits and nails w/o clubbing, cyanosis, infection, petechiae, ischemia, or inflammatory conditions.Marland Kitchen Psychiatric Judgement and insight Intact.. No evidence of depression, anxiety, or agitation.. General Notes: the wound looks excellent grown much smaller and he will have second application of Apligraf Integumentary (Hair, Skin) No suspicious lesions. No crepitus or fluctuance. No peri-wound warmth or erythema. No masses.. Wound #2 status is Open. Original cause of wound was Gradually Appeared. The wound is located on the [place], [place] ([id number]) Right,Proximal,Medial Malleolus. The wound measures 2.2cm length x 2cm width x 0.2cm depth; 3.456cm^2 area and 0.691cm^3 volume. The wound is limited to skin breakdown. There is no tunneling or undermining noted. There is a medium amount of serosanguineous drainage noted. The wound margin is distinct with the outline attached to the wound base. There is large (67-100%) red, pink granulation within the wound bed. There is a small (1-33%) amount of necrotic tissue within the wound bed including Adherent Slough. The periwound skin appearance exhibited: Scarring, Dry/Scaly, Moist, Hemosiderin Staining. The periwound skin appearance did not exhibit: Callus, Crepitus, Excoriation, Fluctuance, Friable, Induration, Localized Edema, Rash, Maceration, Atrophie Blanche, Cyanosis, Ecchymosis, Mottled, Pallor, Rubor, Erythema. Periwound temperature was noted as No Abnormality. 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 Outer dressing will include DrawTex and appropriate padding and then he will have a 4-layer compression wrap. He will see his back next week for a wound check. Procedures Wound #2 Wound #2 is a Venous Leg Ulcer located on the Right,Proximal,Medial Malleolus. A skin graft procedure using a bioengineered skin substitute/cellular or tissue based product was performed by Nathifa Ritthaler, Ignacia Felling., MD. Apligraf was applied and secured with Steri-Strips. 5 sq cm of product was utilized and 39 sq cm was wasted due to wound sizs. Post Application, mepitel was applied. A Time Out was conducted prior to the start of the procedure. The procedure was tolerated well with a pain level of 0 throughout and a pain level of 0 following the procedure. Plan [place], [place] ([id number]) Wound Cleansing: Wound #2 Right,Proximal,Medial Malleolus: Clean wound with Normal Saline. Primary Wound Dressing: Wound #2 Right,Proximal,Medial Malleolus: Other: - Mepitel secured by steri-strips - apligraf applied by Dr Meyer Russel today - do not remove below meptiel next week Secondary Dressing: Wound #2 Right,Proximal,Medial Malleolus: Drawtex Dressing Change Frequency: Wound #2 Right,Proximal,Medial Malleolus: Change dressing every week Follow-up Appointments: Wound #2 Right,Proximal,Medial Malleolus: Return Appointment in 1 week. Edema Control: 4-Layer Compression System - Right Lower Extremity Apligraf was placed in the usual fashion with sterile precautions. Outer dressing will include DrawTex and appropriate padding and then he will have a 4-layer compression wrap. He will see his back next week for a wound check. Electronic  Signature(s) Signed: 02/15/2015 5:00:58 PM By: Evlyn Kanner MD, FACS Previous Signature: 02/15/2015 9:06:45 AM Version By: Evlyn Kanner MD, FACS Entered By: Evlyn Kanner on 02/15/2015 17:00:58 Maurice Sanders ([id number]) -------------------------------------------------------------------------------- SuperBill Details Patient Name: Maurice Sanders Date of Service: 02/15/2015 Medical Record Number: [id number] Patient Account Number: [id number] Date of Birth/Sex: Nov 11, 1945 (69 y.o. 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 Facility Procedures CPT4 Code Description: 16109604 (Facility Use Only) Apligraf 1 SQ CM Modifier: Quantity: 44 CPT4 Code Description: 54098119 15271 - SKIN SUB GRAFT TRNK/ARM/LEG ICD-10 Description Diagnosis 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 I83.013 Varicose veins of right lower  extremity with ulcer o Modifier: of right lowe f ankle Quantity: 1 r extremity Physician Procedures CPT4 Code Description: 1478295 15271 - WC PHYS SKIN SUB GRAFT TRNK/ARM/LEG ICD-10 Description Diagnosis 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

## 2015-02-16 DIAGNOSIS — L97311 Non-pressure chronic ulcer of right ankle limited to breakdown of skin: Secondary | ICD-10-CM | POA: Diagnosis not present

## 2015-02-16 DIAGNOSIS — I83013 Varicose veins of right lower extremity with ulcer of ankle: Secondary | ICD-10-CM | POA: Diagnosis not present

## 2015-02-16 DIAGNOSIS — E11620 Type 2 diabetes mellitus with diabetic dermatitis: Secondary | ICD-10-CM | POA: Diagnosis not present

## 2015-02-16 DIAGNOSIS — I87311 Chronic venous hypertension (idiopathic) with ulcer of right lower extremity: Secondary | ICD-10-CM | POA: Diagnosis not present

## 2015-02-16 NOTE — Progress Notes (Signed)
Maurice Sanders, Maurice Sanders ([id number]) Visit Report for 02/16/2015 Arrival Information Details Patient Name: Maurice Sanders, Maurice Sanders Date of Service: 02/16/2015 9:30 AM Medical Record Number: [id number] Patient Account Number: [id number] Date of Birth/Sex: 10/10/1945 [age] y.o. Male) Treating RN: Curtis Sitesorthy, Joanna Primary Care Physician: Beverely RisenKHAN, FOZIA Other Clinician: Referring Physician: Beverely RisenKHAN, FOZIA Treating Physician/Extender: Rudene ReBritto, Errol Weeks in Treatment: 31 Visit Information History Since Last Visit Added or deleted any medications: No Patient Arrived: Ambulatory Any new allergies or adverse reactions: No Arrival Time: 09:54 Had a fall or experienced change in No Accompanied By: self activities of daily living that may affect Transfer Assistance: None risk of falls: Patient Identification Verified: Yes Signs or symptoms of abuse/neglect since last No Secondary Verification Process Yes visito Completed: Hospitalized since last visit: No Patient Requires Transmission- No Pain Present Now: No Based Precautions: Patient Has Alerts: Yes Patient Alerts: Patient on Blood Thinner DM II ABI L 1.06; R 1.13 Electronic Signature(s) Signed: 02/16/2015 10:12:58 AM By: Curtis Sitesorthy, Joanna Entered By: Curtis Sitesorthy, Joanna on 02/16/2015 10:12:58 Maurice Sanders, Maurice Sanders ([id number]) -------------------------------------------------------------------------------- Encounter Discharge Information Details Patient Name: Maurice Sanders, Maurice Sanders Date of Service: 02/16/2015 9:30 AM Medical Record Number: [id number] Patient Account Number: [id number] Date of Birth/Sex: 04/01/1946 [age] y.o. Male) Treating RN: Curtis Sitesorthy, Joanna Primary Care Physician: Beverely RisenKHAN, FOZIA Other Clinician: Referring Physician: Beverely RisenKHAN, FOZIA Treating Physician/Extender: Rudene ReBritto, Errol Weeks in Treatment: 31 Encounter Discharge Information Items Discharge Pain Level: 0 Discharge Condition: Stable Ambulatory Status: Ambulatory Discharge Destination:  Home Private Transportation: Auto Accompanied By: self Schedule Follow-up Appointment: No Medication Reconciliation completed and No provided to Patient/Care Georgian Mcclory: Clinical Summary of Care: Electronic Signature(s) Signed: 02/16/2015 10:16:12 AM By: Curtis Sitesorthy, Joanna Entered By: Curtis Sitesorthy, Joanna on 02/16/2015 10:16:11 Maurice Sanders, Maurice Sanders ([id number]) -------------------------------------------------------------------------------- Patient/Caregiver Education Details Patient Name: Maurice Sanders, Maurice Sanders Date of Service: 02/16/2015 9:30 AM Medical Record Number: [id number] Patient Account Number: [id number] Date of Birth/Gender: 05/20/1946 [age] y.o. Male) Treating RN: Curtis Sitesorthy, Joanna Primary Care Physician: Beverely RisenKHAN, FOZIA Other Clinician: Referring Physician: Beverely RisenKHAN, FOZIA Treating Physician/Extender: Rudene ReBritto, Errol Weeks in Treatment: 5231 Education Assessment Education Provided To: Patient Education Topics Provided Wound/Skin Impairment: Handouts: Other: come in again for rewrap if needed Methods: Explain/Verbal Responses: State content correctly Electronic Signature(s) Signed: 02/16/2015 10:15:53 AM By: Curtis Sitesorthy, Joanna Entered By: Curtis Sitesorthy, Joanna on 02/16/2015 10:15:53

## 2015-02-16 NOTE — Progress Notes (Signed)
Maurice Sanders, Oriel ([id number]) Visit Report for 02/15/2015 Arrival Information Details Patient Name: Maurice Sanders, Maurice Sanders Date of Service: 02/15/2015 8:00 AM Medical Record Number: [id number] Patient Account Number: [id number] Date of Birth/Sex: 12/15/1945 [age] y.o. Male) Treating RN: Curtis Sitesorthy, Joanna Primary Care Physician: Beverely RisenKHAN, FOZIA Other Clinician: Referring Physician: Beverely RisenKHAN, FOZIA Treating Physician/Extender: Rudene ReBritto, Errol Weeks in Treatment: 31 Visit Information History Since Last Visit Added or deleted any medications: No Patient Arrived: Ambulatory Any new allergies or adverse reactions: No Arrival Time: 08:12 Had a fall or experienced change in No Accompanied By: self activities of daily living that may affect Transfer Assistance: None risk of falls: Patient Identification Verified: Yes Signs or symptoms of abuse/neglect since last No Secondary Verification Process Yes visito Completed: Hospitalized since last visit: No Patient Requires Transmission- No Pain Present Now: No Based Precautions: Patient Has Alerts: Yes Patient Alerts: Patient on Blood Thinner DM II ABI L 1.06; R 1.13 Electronic Signature(s) Signed: 02/15/2015 5:45:58 PM By: Curtis Sitesorthy, Joanna Entered By: Curtis Sitesorthy, Joanna on 02/15/2015 08:12:48 Maurice Sanders, Eagle ([id number]) -------------------------------------------------------------------------------- Encounter Discharge Information Details Patient Name: Maurice Sanders, Maurice Sanders Date of Service: 02/15/2015 8:00 AM Medical Record Number: [id number] Patient Account Number: [id number] Date of Birth/Sex: 11/11/1945 [age] y.o. Male) Treating RN: Curtis Sitesorthy, Joanna Primary Care Physician: Beverely RisenKHAN, FOZIA Other Clinician: Referring Physician: Beverely RisenKHAN, FOZIA Treating Physician/Extender: Rudene ReBritto, Errol Weeks in Treatment: 31 Encounter Discharge Information Items Discharge Pain Level: 0 Discharge Condition: Stable Ambulatory Status: Ambulatory Discharge Destination:  Home Private Transportation: Auto Accompanied By: self Schedule Follow-up Appointment: Yes Medication Reconciliation completed and No provided to Patient/Care Deshayla Empson: Clinical Summary of Care: Electronic Signature(s) Signed: 02/15/2015 9:15:51 AM By: Curtis Sitesorthy, Joanna Entered By: Curtis Sitesorthy, Joanna on 02/15/2015 09:15:50 Maurice Sanders, Shawn ([id number]) -------------------------------------------------------------------------------- Lower Extremity Assessment Details Patient Name: Maurice Sanders, Maurice Sanders Date of Service: 02/15/2015 8:00 AM Medical Record Number: [id number] Patient Account Number: [id number] Date of Birth/Sex: 11/29/1945 [age] y.o. Male) Treating RN: Curtis Sitesorthy, Joanna Primary Care Physician: Beverely RisenKHAN, FOZIA Other Clinician: Referring Physician: Beverely RisenKHAN, FOZIA Treating Physician/Extender: Rudene ReBritto, Errol Weeks in Treatment: 31 Edema Assessment Assessed: [Left: No] [Right: No] E[Left: dema] [Right: :] Calf Left: Right: Point of Measurement: 33 cm From Medial Instep cm 36.3 cm Ankle Left: Right: Point of Measurement: 11 cm From Medial Instep cm 26.1 cm Vascular Assessment Pulses: Posterior Tibial Palpable: [Right:Yes] Extremity colors, hair growth, and conditions: Extremity Color: [Right:Hyperpigmented] Hair Growth on Extremity: [Right:No] Temperature of Extremity: [Right:Warm] Capillary Refill: [Right:< 3 seconds] Toe Nail Assessment Left: Right: Thick: Yes Discolored: Yes Deformed: Yes Improper Length and Hygiene: No Electronic Signature(s) Signed: 02/15/2015 5:45:58 PM By: Curtis Sitesorthy, Joanna Entered By: Curtis Sitesorthy, Joanna on 02/15/2015 08:20:40 Maurice Sanders, Maurice Sanders ([id number]) -------------------------------------------------------------------------------- Multi Wound Chart Details Patient Name: Maurice Sanders, Maurice Sanders Date of Service: 02/15/2015 8:00 AM Medical Record Number: [id number] Patient Account Number: [id number] Date of Birth/Sex: 03/14/1946 [age] y.o. 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] [N/A:N/A] Comorbid History: [2:Sleep Apnea, Congestive Heart Failure, Deep Vein Thrombosis, Hypertension, Type II Diabetes, Gout] [N/A:N/A] Date Acquired: [2:07/31/2014] [N/A:N/A] Weeks of Treatment: [2:28] [N/A:N/A] Wound Status: [2:Open] [N/A:N/A] Measurements L x W x D 2.5x2x0.2 [N/A:N/A] (cm) Area (cm) : [2:3.927] [N/A:N/A] Volume (cm) : [2:0.785] [N/A:N/A] % Reduction in Area: [2:-455.40%] [N/A:N/A] %  Reduction in Volume: -1005.60% [N/A:N/A] Classification: [2:Full Thickness Without Exposed Support Structures] [N/A:N/A] HBO Classification: [2:Grade 1] [N/A:N/A] Exudate Amount: [2:Medium] [N/A:N/A] Exudate Type: [2:Serosanguineous] [N/A:N/A] Exudate Color: [2:red, brown] [N/A:N/A] Wound Margin: [2:Distinct, outline attached] [N/A:N/A] Granulation Amount: [2:Large (67-100%)] [N/A:N/A] Granulation Quality: [2:Red, Pink] [N/A:N/A] Necrotic Amount: [2:Small (1-33%)] [N/A:N/A] Exposed Structures: Fascia: No N/A N/A Fat: No Tendon: No Muscle: No Joint: No Bone: No Limited to Skin Breakdown Epithelialization: Medium (34-66%) N/A N/A Periwound Skin Texture: Scarring: Yes N/A N/A Edema: No Excoriation: No Induration: No Callus: No Crepitus: No Fluctuance: No Friable: No Rash: No Periwound Skin Moist: Yes N/A N/A Moisture: Dry/Scaly: Yes Maceration: No Periwound Skin Color: Hemosiderin Staining: Yes N/A N/A Atrophie Blanche: No Cyanosis: No Ecchymosis: No Erythema:  No Mottled: No Pallor: No Rubor: No Temperature: No Abnormality N/A N/A Tenderness on No N/A N/A Palpation: Wound Preparation: Ulcer Cleansing: N/A N/A Rinsed/Irrigated with Saline, Other: water and soap Topical Anesthetic Applied: Other: lidocaine 4% Treatment Notes Electronic Signature(s) Signed: 02/15/2015 5:45:58 PM By: Curtis Sites Entered By: Curtis Sites on 02/15/2015 08:30:11 Maurice Bayley ([id number]) -------------------------------------------------------------------------------- Multi-Disciplinary Care Plan Details Patient Name: Maurice Bayley Date of Service: 02/15/2015 8:00 AM Medical Record Number: [id number] Patient Account Number: [id number] Date of Birth/Sex: 22-Dec-1945 (69 y.o. Male) Treating RN: Curtis Sites Primary Care Physician: Beverely Risen Other Clinician: Referring Physician: Beverely Risen Treating Physician/Extender: Rudene Re in Treatment: 75 Active Inactive Abuse / Safety / Falls / Self Care Management Nursing Diagnoses: Potential for falls Goals: Patient will remain injury free Date Initiated: 07/10/2014 Goal Status: Active Interventions: Assess fall risk on admission and as needed Notes: Nutrition Nursing Diagnoses: Potential for alteratiion in Nutrition/Potential for imbalanced nutrition Goals: Patient/caregiver agrees to and verbalizes understanding of need to use nutritional supplements and/or vitamins as prescribed Date Initiated: 07/10/2014 Goal Status: Active Interventions: Assess patient nutrition upon admission and as needed per policy Notes: Orientation to the Wound Care Program Nursing Diagnoses: Knowledge deficit related to the wound healing center program Goals: Patient/caregiver will verbalize understanding of the Wound Healing Center Program [place], [place] ([id number]) Date Initiated: 07/10/2014 Goal Status: Active Interventions: Provide education on orientation to the wound center Notes: Wound/Skin  Impairment Nursing Diagnoses: Impaired tissue integrity Goals: Ulcer/skin breakdown will have a volume reduction of 30% by week 4 Date Initiated: 07/10/2014 Goal Status: Active Interventions: Assess ulceration(s) every visit Notes: Electronic Signature(s) Signed: 02/15/2015 5:45:58 PM By: Curtis Sites Entered By: Curtis Sites on 02/15/2015 08:29:49 Maurice Bayley ([id number]) -------------------------------------------------------------------------------- Patient/Caregiver Education Details Patient Name: Maurice Bayley Date of Service: 02/15/2015 8:00 AM Medical Record Number: [id number] Patient Account Number: [id number] Date of Birth/Gender: 15-Jul-1946 (69 y.o. Male) Treating RN: Curtis Sites Primary Care Physician: Beverely Risen Other Clinician: Referring Physician: Beverely Risen Treating Physician/Extender: Rudene Re in Treatment: 37 Education Assessment Education Provided To: Patient Education Topics Provided Wound/Skin Impairment: Handouts: Other: come in for rewrap if needed Methods: Demonstration, Explain/Verbal Responses: State content correctly Electronic Signature(s) Signed: 02/15/2015 9:16:09 AM By: Curtis Sites Entered By: Curtis Sites on 02/15/2015 09:16:09 Maurice Bayley ([id number]) -------------------------------------------------------------------------------- Wound Assessment Details Patient Name: Maurice Bayley Date of Service: 02/15/2015 8:00 AM Medical Record Number: [id number] Patient Account Number: [id number] Date of Birth/Sex: 1945-10-01 (68 y.o. Male) Treating RN: Curtis Sites Primary Care Physician: Beverely Risen Other Clinician: Referring Physician: Beverely Risen Treating Physician/Extender: Rudene Re in Treatment: 31 Wound Status Wound Number: 2 Primary Venous Leg Ulcer Etiology: Wound Location: Right, Proximal, Medial Malleolus Wound Open Status: Wounding Event: Gradually Appeared Comorbid Sleep Apnea, Congestive  Heart  Failure, Date Acquired: 07/31/2014 History: Deep Vein Thrombosis, Hypertension, Weeks Of Treatment: 28 Type II Diabetes, Gout Clustered Wound: No Photos Photo Uploaded By: Elliot Gurney, RN, BSN, Kim on 02/15/2015 17:48:37 Wound Measurements Length: (cm) 2.2 Width: (cm) 2 Depth: (cm) 0.2 Area: (cm) 3.456 Volume: (cm) 0.691 % Reduction in Area: -388.8% % Reduction in Volume: -873.2% Epithelialization: Medium (34-66%) Tunneling: No Undermining: No Wound Description Full Thickness Without Exposed Foul Odor A Classification: Support Structures Diabetic Severity Grade 1 (Wagner): Wound Margin: Distinct, outline attached Exudate Amount: Medium Exudate Type: Serosanguineous Exudate Color: red, brown fter Cleansing: No Wound Bed Granulation Amount: Large (67-100%) Exposed Structure [place], [place] ([id number]) Granulation Quality: Red, Pink Fascia Exposed: No Necrotic Amount: Small (1-33%) Fat Layer Exposed: No Necrotic Quality: Adherent Slough Tendon Exposed: No Muscle Exposed: No Joint Exposed: No Bone Exposed: No Limited to Skin Breakdown Periwound Skin Texture Texture Color No Abnormalities Noted: No No Abnormalities Noted: No Callus: No Atrophie Blanche: No Crepitus: No Cyanosis: No Excoriation: No Ecchymosis: No Fluctuance: No Erythema: No Friable: No Hemosiderin Staining: Yes Induration: No Mottled: No Localized Edema: No Pallor: No Rash: No Rubor: No Scarring: Yes Temperature / Pain Moisture Temperature: No Abnormality No Abnormalities Noted: No Dry / Scaly: Yes Maceration: No Moist: Yes Wound Preparation Ulcer Cleansing: Rinsed/Irrigated with Saline, Other: water and soap, Topical Anesthetic Applied: Other: lidocaine 4%, Treatment Notes Wound #2 (Right, Proximal, Medial Malleolus) 1. Cleansed with: Cleanse wound with antibacterial soap and water 2. Anesthetic Topical Lidocaine 4% cream to wound bed prior to debridement 3. Peri-wound  Care: Skin Prep 4. Dressing Applied: Mepitel Other dressing (specify in notes) 7. Secured with 4-Layer Compression System - Right Lower Extremity Notes contact layer applied on non wounded areas. apligraf applied by Dr Meyer Russel today with mepitel secured with steri-strips, drawtex, under 4 layer wrap BENNETT, RAM ([id number]) Electronic Signature(s) Signed: 02/15/2015 5:45:58 PM By: Curtis Sites Entered By: Curtis Sites on 02/15/2015 08:49:39 Maurice Bayley ([id number]) -------------------------------------------------------------------------------- Vitals Details Patient Name: Maurice Bayley Date of Service: 02/15/2015 8:00 AM Medical Record Number: [id number] Patient Account Number: [id number] Date of Birth/Sex: 12-Nov-1945 (68 y.o. Male) Treating RN: Curtis Sites Primary Care Physician: Beverely Risen Other Clinician: Referring Physician: Beverely Risen Treating Physician/Extender: Rudene Re in Treatment: 31 Vital Signs Time Taken: 08:13 Temperature (F): 98.3 Height (in): 71 Pulse (bpm): 60 Weight (lbs): 291.7 Respiratory Rate (breaths/min): 18 Body Mass Index (BMI): 40.7 Blood Pressure (mmHg): 153/54 Reference Range: 80 - 120 mg / dl Electronic Signature(s) Signed: 02/15/2015 5:45:58 PM By: Curtis Sites Entered By: Curtis Sites on 02/15/2015 08:13:23

## 2015-02-22 ENCOUNTER — Encounter: Payer: Medicare Other | Admitting: Surgery

## 2015-02-22 DIAGNOSIS — I87311 Chronic venous hypertension (idiopathic) with ulcer of right lower extremity: Secondary | ICD-10-CM | POA: Diagnosis not present

## 2015-02-22 DIAGNOSIS — E11620 Type 2 diabetes mellitus with diabetic dermatitis: Secondary | ICD-10-CM | POA: Diagnosis not present

## 2015-02-22 DIAGNOSIS — I6523 Occlusion and stenosis of bilateral carotid arteries: Secondary | ICD-10-CM | POA: Diagnosis not present

## 2015-02-22 DIAGNOSIS — L97311 Non-pressure chronic ulcer of right ankle limited to breakdown of skin: Secondary | ICD-10-CM | POA: Diagnosis not present

## 2015-02-22 DIAGNOSIS — I83013 Varicose veins of right lower extremity with ulcer of ankle: Secondary | ICD-10-CM | POA: Diagnosis not present

## 2015-02-22 NOTE — Progress Notes (Addendum)
MACKLIN, JACQUIN ([id number]) Visit Report for 02/22/2015 Chief Complaint Document Details Patient Name: Maurice Sanders, Maurice Sanders Date of Service: 02/22/2015 8:15 AM Medical Record Number: [id number] Patient Account Number: [id number] Date of Birth/Sex: August 10, 1945 (69 y.o. Male) Treating RN: Primary Care Physician: Beverely Risen Other Clinician: Referring Physician: Beverely Risen Treating Physician/Extender: Rudene Re in Treatment: 13 Information Obtained from: Patient Chief Complaint Praise has been coming for dressing changes twice a week, on Monday and Thursday. Over all he is doing fine he has his blood sugar under control and is managing his diuretics well. after changing over from calamine to zinc oxide he said his own Unna's boot still gave him a bit of itching and problems and he had significant swelling which made the Unna's boot fairly tight. Electronic Signature(s) Signed: 02/22/2015 8:38:57 AM By: Evlyn Kanner MD, FACS Entered By: Evlyn Kanner on 02/22/2015 08:38:57 Maurice Sanders ([id number]) -------------------------------------------------------------------------------- HPI Details Patient Name: Maurice Sanders Date of Service: 02/22/2015 8:15 AM Medical Record Number: [id number] Patient Account Number: [id number] Date of Birth/Sex: 1946-04-08 (69 y.o. Male) Treating RN: Primary Care Physician: Beverely Risen Other Clinician: Referring Physician: Beverely Risen Treating Physician/Extender: Rudene Re in Treatment: 32 History of Present Illness Location: right lower extremity Quality: Patient reports experiencing a dull pain to affected area(s). Severity: patient denies any severe pain. Duration: he has had this wound for a few weeks now. Timing: Pain in wound is Intermittent (comes and goes Context: the patient has a long history of venous insufficiency. He has been very compliant with wearing his compression stockings. Modifying Factors: the patient normally wears  compression stockings. Recently he had placed a fair amount of Vaseline which cause skin irritation. Associated Signs and Symptoms: Patient denies any fevers. He has had a moderate amount of drainage. HPI Description: The patient is 69 year-old male with a history of diabetes who presents with right lower extremity wound. He first presented to our clinic on 07/10/2014. He does have a history of venous insufficiency in the past. He has been very compliant with wearing his compression stockings. He recently Vaseline on his legs which caused skin irritation and breakdown. The patient returns for follow-up today. he denies any fevers at home. However due to the fact that he had stopped his diuretic he has had a lot of edema of the lower extremity. He also has a lot of scaly skin on his right lower extremity. 10/02/14 -- he has resumed his diuretics on a regular basis and has been walking around adequately and has overall improved. No pain or no discharge from the wounds. 10/09/14 -- the patient has been having some pain at night while sleeping and was requesting pain medications. He also says that due to taking his diuretics he's been getting some cramps and I have asked him to please see his PCP regarding both these problems. Also awaiting insurance clearance regarding his Apligraf. addendum: I have now got a clear copy of his vascular workup studies and have reviewed the entire details dated encounter 08/17/2014. the lower extremity venous duplex study done on 08/17/2014 revealed no evidence of deep vein thrombosis or superficial thrombophlebitis bilaterally, no incompetence of the greater small saphenous veins bilaterally, enlarged lymph nodes in the groin bilaterally. As seen by Dr. Gilda Crease on the same day. Dr. Gilda Crease recommended that he should continue wearing graduated compression stockings of class I which is 20-30 mmHg pressure on a daily basis and a prescription was given. He was to be  reassessed in 6 months and the question  of getting him lymphedema pumps were also discussed. 10/16/2014. Vascular note reviewed in detail. I have been able to review his notes from Dr. Gilda Crease on 08/17/2014. 1.The venous duplex study done on 08/17/2014 showed that all veins visualized showed no evidence of DVT. 2. no incompetence of the greater small saphenous veins bilaterally. Maurice Sanders, Maurice Sanders ([id number]) The plan was that he had no surgery or intervention needed at this point. The long discussion had showed that the patient had chronic skin changes accompanied by venous insufficiency and at this stage he would benefit from wearing graduated compression stockings of the 20-30 mmHg on a daily basis and review would be done in his office back again in 6 months. At that time the patient could have a lymph pump. except for the new abrasion on the right lower extremity just below his tibial tuberosity he is doing fine and his ulcers actually look much better. 10/23/14 -- Culture report on Maurice Sanders --- His wound culture has grown Citrobacter koseri and Enterococcus faecalis. Final report is back but so far sensitive to Bactrim and ampicillin and ciprofloxacin. Bactrim DS was called in for 14 days. 11/13/2014 last week we changed his Unna boot on Monday and Thursday and this has helped him a lot. The edema has been much better the Unna boot hasn't slipped down and he has not had any skin abrasions. 11/20/2014 -- he saw the vascular surgeon Dr. Gilda Crease this morning and he was pleased with his progress and has said he would be ordering him some lymphedema pumps. Other than that the patient is coming to change his Unna's boots twice a week and he is doing well with this. 11/27/2014 -- over Saturday and Sunday he started having more swelling in his leg and some pain and I believe his own Unna's boot caused him some discomfort and excoriation of skin. He does admit he took some more juices orally  but he has continued his diuretic. 12/11/2014 -- he has completed his course of antibiotics and has had no fresh issues. He has had no pain no swelling and is managing well with his blood sugars and diuretics. 12/14/2014 between Monday and today he has had some dietary indiscretions and had some [place] food which may have made him retain a lot of fluid. His edema has increased markedly and a lot of more excoriation. 12/25/2014 -- This week he has really done well and his edema is under control and these had no pain or problems. His leg is feeling good. 01/04/2015 -- He has done great along the way and he has managed to keep his wrap for almost her entire week. No issues or problems. 02/01/2015 -- he is doing fine and easier for his first application of the Apligraf skin substitute. 02/08/2015 -- he had a lot of drainage to the dressing over the Apligraf and this will be addressed today. 02/15/2015 -- his wound looks very good and there was not much of drainage. He will have a second application of Apligraf. 02/22/2015 -- his Apligraf is in place and he has significant drainage. Electronic Signature(s) Signed: 02/22/2015 8:39:37 AM By: Evlyn Kanner MD, FACS Entered By: Evlyn Kanner on 02/22/2015 08:39:37 Maurice Sanders ([id number]) -------------------------------------------------------------------------------- Physical Exam Details Patient Name: Maurice Sanders Date of Service: 02/22/2015 8:15 AM Medical Record Number: [id number] Patient Account Number: [id number] Date of Birth/Sex: 12-31-1945 (69 y.o. Male) Treating RN: Primary Care Physician: Beverely Risen Other Clinician: Referring Physician: Beverely Risen Treating Physician/Extender: Rudene Re in Treatment: 32 Constitutional .  Pulse regular. Respirations normal and unlabored. Afebrile. . Eyes Nonicteric. Reactive to light. Ears, Nose, Mouth, and Throat Lips, teeth, and gums WNL.Marland Kitchen Moist mucosa without lesions . Neck supple  and nontender. No palpable supraclavicular or cervical adenopathy. Normal sized without goiter. Respiratory WNL. No retractions.. Cardiovascular Pedal Pulses WNL. No clubbing, cyanosis or edema. Chest Breasts symmetical and no nipple discharge.. Breast tissue WNL, no masses, lumps, or tenderness.. Musculoskeletal Adexa without tenderness or enlargement.. Digits and nails w/o clubbing, cyanosis, infection, petechiae, ischemia, or inflammatory conditions.. Integumentary (Hair, Skin) No suspicious lesions. No crepitus or fluctuance. No peri-wound warmth or erythema. No masses.Marland Kitchen Psychiatric Judgement and insight Intact.. No evidence of depression, anxiety, or agitation.. Notes there is abrasion due to the compression wrap in his infrapatellar region and this will be addressed separately. The Apligraf is in place and there is significant amount of soupy drainage. Electronic Signature(s) Signed: 02/22/2015 8:40:21 AM By: Evlyn Kanner MD, FACS Entered By: Evlyn Kanner on 02/22/2015 08:40:20 Maurice Sanders ([id number]) -------------------------------------------------------------------------------- Physician Orders Details Patient Name: Maurice Sanders Date of Service: 02/22/2015 8:15 AM Medical Record Number: [id number] Patient Account Number: [id number] Date of Birth/Sex: 21-Aug-1945 (69 y.o. 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 Primary Wound Dressing Wound #2 Right,Proximal,Medial Malleolus o Other: - Mepitel secured by steri-strips - apligraf applied by Dr Meyer Russel last week - dressing not remove below meptiel Secondary Dressing Wound #2 Right,Proximal,Medial Malleolus o Drawtex Dressing Change Frequency Wound #2 Right,Proximal,Medial Malleolus o Change dressing every week Follow-up Appointments Wound #2 Right,Proximal,Medial Malleolus o Return Appointment in 1 week. Edema Control o 4-Layer Compression System - Right Lower Extremity - Vasaline gauze covering leg under wrap Electronic Signature(s) Signed: 02/22/2015 11:59:42 AM By: Evlyn Kanner MD, FACS Signed: 02/22/2015 4:42:55 PM By: Elliot Gurney, RN, BSN, Kim RN, BSN Entered By: Elliot Gurney, RN, BSN, Kim on 02/22/2015 08:44:21 Maurice Sanders, Maurice Sanders ([id number]) Maurice Sanders, Maurice Sanders ([id number]) -------------------------------------------------------------------------------- Problem List Details Patient Name: Maurice Sanders Date of Service: 02/22/2015 8:15 AM Medical Record Number: [id number] Patient Account Number: [id number] Date of Birth/Sex: [birth year] (69 y.o. 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 Male) Treating RN: Primary Care Physician: Beverely Risen Other Clinician: Referring Physician: Beverely Risen Treating Physician/Extender: Rudene Re in Treatment: 32 Subjective Chief Complaint Information obtained from Patient Edu has been coming for dressing changes twice a week, on Monday and Thursday. Over all he is doing fine he has his  blood sugar under control and is managing his diuretics well. after changing over from calamine to zinc oxide he said his own Unna's boot still gave him a bit of itching and problems and he had significant swelling which made the Unna's boot fairly tight. History of Present Illness (HPI) The following HPI elements were documented for the patient's wound: Location: right lower extremity Quality: Patient reports experiencing a dull pain to affected area(s). Severity: patient denies any severe pain. Duration: he has had this wound for a few weeks now. Timing: Pain in wound is Intermittent (comes and goes Context: the patient has a long history of venous insufficiency. He has been very compliant with wearing his compression stockings. Modifying Factors: the patient normally wears compression stockings. Recently he had placed a fair amount of Vaseline which cause skin irritation. Associated Signs and Symptoms: Patient denies any fevers. He has had a moderate amount of drainage. The patient is 69 year-old male with a history of diabetes who presents with right lower extremity wound. He first presented to our clinic on 07/10/2014. He does have a history of venous insufficiency in the past. He has been very compliant with wearing his compression stockings. He recently Vaseline on his legs which caused skin irritation and breakdown. The patient returns for follow-up today. he denies any fevers  at home. However due to the fact that he had stopped his diuretic he has had a lot of edema of the lower extremity. He also has a lot of scaly skin on his right lower extremity. 10/02/14 -- he has resumed his diuretics on a regular basis and has been walking around adequately and has overall improved. No pain or no discharge from the wounds. 10/09/14 -- the patient has been having some pain at night while sleeping and was requesting pain medications. He also says that due to taking his diuretics he's been getting some cramps and I have asked him to please see his PCP regarding both these problems. Also awaiting insurance clearance regarding his Apligraf. addendum: I have now got a clear copy of his vascular workup studies and have reviewed the entire details dated encounter 08/17/2014. the lower extremity venous duplex study done on 08/17/2014 revealed no evidence of deep vein thrombosis or superficial thrombophlebitis bilaterally, no incompetence of the greater small saphenous veins bilaterally, enlarged lymph nodes in the groin bilaterally. Maurice Sanders, Maurice Sanders ([id number]) As seen by Dr. Gilda Crease on the same day. Dr. Gilda Crease recommended that he should continue wearing graduated compression stockings of class I which is 20-30 mmHg pressure on a daily basis and a prescription was given. He was to be reassessed in 6 months and the question of getting him lymphedema pumps were also discussed. 10/16/2014. Vascular note reviewed in detail. I have been able to review his notes from Dr. Gilda Crease on 08/17/2014. 1.The venous duplex study done on 08/17/2014 showed that all veins visualized showed no evidence of DVT. 2. no incompetence of the greater small saphenous veins bilaterally. The plan was that he had no surgery or intervention needed at this point. The long discussion had showed that the patient had chronic skin changes accompanied by venous insufficiency and at this stage he would benefit from wearing  graduated compression stockings of the 20-30 mmHg on a daily basis and review would be done in his office back again in 6 months. At that time the patient could have a lymph pump. except for the new abrasion on the right lower extremity just below his tibial tuberosity he is  doing fine and his ulcers actually look much better. 10/23/14 -- Culture report on Maurice Sanders --- His wound culture has grown Citrobacter koseri and Enterococcus faecalis. Final report is back but so far sensitive to Bactrim and ampicillin and ciprofloxacin. Bactrim DS was called in for 14 days. 11/13/2014 last week we changed his Unna boot on Monday and Thursday and this has helped him a lot. The edema has been much better the Unna boot hasn't slipped down and he has not had any skin abrasions. 11/20/2014 -- he saw the vascular surgeon Dr. Gilda Crease this morning and he was pleased with his progress and has said he would be ordering him some lymphedema pumps. Other than that the patient is coming to change his Unna's boots twice a week and he is doing well with this. 11/27/2014 -- over Saturday and Sunday he started having more swelling in his leg and some pain and I believe his own Unna's boot caused him some discomfort and excoriation of skin. He does admit he took some more juices orally but he has continued his diuretic. 12/11/2014 -- he has completed his course of antibiotics and has had no fresh issues. He has had no pain no swelling and is managing well with his blood sugars and diuretics. 12/14/2014 between Monday and today he has had some dietary indiscretions and had some [place] food which may have made him retain a lot of fluid. His edema has increased markedly and a lot of more excoriation. 12/25/2014 -- This week he has really done well and his edema is under control and these had no pain or problems. His leg is feeling good. 01/04/2015 -- He has done great along the way and he has managed to keep his wrap  for almost her entire week. No issues or problems. 02/01/2015 -- he is doing fine and easier for his first application of the Apligraf skin substitute. 02/08/2015 -- he had a lot of drainage to the dressing over the Apligraf and this will be addressed today. Maurice Sanders, Maurice Sanders ([id number]) 02/15/2015 -- his wound looks very good and there was not much of drainage. He will have a second application of Apligraf. 02/22/2015 -- his Apligraf is in place and he has significant drainage. Objective Constitutional Pulse regular. Respirations normal and unlabored. Afebrile. Vitals Time Taken: 8:19 AM, Height: 71 in, Weight: 291.7 lbs, BMI: 40.7, Temperature: 98.1 F, Pulse: 66 bpm, Respiratory Rate: 18 breaths/min, Blood Pressure: 150/68 mmHg. Eyes Nonicteric. Reactive to light. Ears, Nose, Mouth, and Throat Lips, teeth, and gums WNL.Marland Kitchen Moist mucosa without lesions . Neck supple and nontender. No palpable supraclavicular or cervical adenopathy. Normal sized without goiter. Respiratory WNL. No retractions.. Cardiovascular Pedal Pulses WNL. No clubbing, cyanosis or edema. Chest Breasts symmetical and no nipple discharge.. Breast tissue WNL, no masses, lumps, or tenderness.. Musculoskeletal Adexa without tenderness or enlargement.. Digits and nails w/o clubbing, cyanosis, infection, petechiae, ischemia, or inflammatory conditions.Marland Kitchen Psychiatric Judgement and insight Intact.. No evidence of depression, anxiety, or agitation.. General Notes: there is abrasion due to the compression wrap in his infrapatellar region and this will be addressed separately. The Apligraf is in place and there is significant amount of soupy drainage. Integumentary (Hair, Skin) No suspicious lesions. No crepitus or fluctuance. No peri-wound warmth or erythema. No masses.Marland Kitchen Maurice Sanders, Maurice Sanders ([id number]) Wound #2 status is Open. Original cause of wound was Gradually Appeared. The wound is located on the Right,Proximal,Medial  Malleolus. The wound measures 2.2cm length x 2cm width x 0.2cm depth; 3.456cm^2 area and 0.691cm^3  volume. The wound is limited to skin breakdown. There is a medium amount of serosanguineous drainage noted. The wound margin is distinct with the outline attached to the wound base. There is large (67-100%) red, pink granulation within the wound bed. There is a small (1-33%) amount of necrotic tissue within the wound bed including Adherent Slough. The periwound skin appearance exhibited: Scarring, Dry/Scaly, Moist, Hemosiderin Staining. The periwound skin appearance did not exhibit: Callus, Crepitus, Excoriation, Fluctuance, Friable, Induration, Localized Edema, Rash, Maceration, Atrophie Blanche, Cyanosis, Ecchymosis, Mottled, Pallor, Rubor, Erythema. Periwound temperature was noted as No Abnormality. General Notes: Week 1 after Apligraf application. Dressing removed down to contact layer only. Used same measurements as last week due to inability to get to wound. 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 He will come back and see as next week for his third application of Apligraf. Plan Wound Cleansing: Wound #2 Right,Proximal,Medial Malleolus: Clean wound with Normal Saline. Primary Wound Dressing: Wound #2 Right,Proximal,Medial Malleolus: Other: - Mepitel secured by steri-strips - apligraf applied by Dr Meyer Russel last week - dressing not remove below meptiel Secondary Dressing: Wound #2 Right,Proximal,Medial Malleolus: Drawtex Dressing Change Frequency: Wound #2 Right,Proximal,Medial Malleolus: Maurice Sanders, Maurice Sanders ([id number]) Change dressing every  week Follow-up Appointments: Wound #2 Right,Proximal,Medial Malleolus: Return Appointment in 1 week. Edema Control: 4-Layer Compression System - Right Lower Extremity - Vasaline gauze covering leg under wrap Appropriate dressing with DrawTex and an ABD pad is going to be applied and the abrasion will be covered with some appropriate padding. He will come back and see as next week for his third application of Apligraf. Electronic Signature(s) Signed: 02/22/2015 12:02:56 PM By: Evlyn Kanner MD, FACS Previous Signature: 02/22/2015 12:02:42 PM Version By: Evlyn Kanner MD, FACS Previous Signature: 02/22/2015 8:41:39 AM Version By: Evlyn Kanner MD, FACS Entered By: Evlyn Kanner on 02/22/2015 12:02:56 Maurice Sanders ([id number]) -------------------------------------------------------------------------------- SuperBill Details Patient Name: Maurice Sanders Date of Service: 02/22/2015 Medical Record Number: [id number] Patient Account Number: [id number] Date of Birth/Sex: 12-Apr-1946 (69 y.o. 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 ulcer of ankle Facility Procedures CPT4: Description Modifier Quantity Code [id number] (Facility Use Only) [phone] - APPLY MULTLAY COMPRS LWR RT 1 LEG Physician Procedures CPT4 Code Description: [id number] - WC PHYS LEVEL 3 - EST PT ICD-10 Description Diagnosis 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

## 2015-02-23 NOTE — Progress Notes (Signed)
AKIEM, URIETA ([id number]) Visit Report for 02/22/2015 Arrival Information Details Patient Name: Maurice Sanders, Maurice Sanders Date of Service: 02/22/2015 8:15 AM Medical Record Number: [id number] Patient Account Number: [id number] Date of Birth/Sex: 1946/04/24 (69 y.o. Male) Treating RN: Huel Coventry Primary Care Physician: Beverely Risen Other Clinician: Referring Physician: Beverely Risen Treating Physician/Extender: Rudene Re in Treatment: 32 Visit Information History Since Last Visit Added or deleted any medications: No Patient Arrived: Ambulatory Any new allergies or adverse reactions: No Arrival Time: 08:15 Had a fall or experienced change in No Accompanied By: self activities of daily living that may affect Transfer Assistance: None risk of falls: Patient Identification Verified: Yes Signs or symptoms of abuse/neglect since last No Secondary Verification Process Yes visito Completed: Hospitalized since last visit: No Patient Requires Transmission- No Has Dressing in Place as Prescribed: Yes Based Precautions: Has Compression in Place as Prescribed: Yes Patient Has Alerts: Yes Pain Present Now: No Patient Alerts: Patient on Blood Thinner DM II ABI L 1.06; R 1.13 Electronic Signature(s) Signed: 02/22/2015 4:42:55 PM By: Elliot Gurney, RN, BSN, Kim RN, BSN Entered By: Elliot Gurney, RN, BSN, Kim on 02/22/2015 08:24:18 Maurice Sanders ([id number]) -------------------------------------------------------------------------------- Encounter Discharge Information Details Patient Name: Maurice Sanders Date of Service: 02/22/2015 8:15 AM Medical Record Number: [id number] Patient Account Number: [id number] Date of Birth/Sex: March 08, 1946 (69 y.o. Male) Treating RN: Huel Coventry Primary Care Physician: Beverely Risen Other Clinician: Referring Physician: Beverely Risen Treating Physician/Extender: Rudene Re in Treatment: 59 Encounter Discharge Information Items Discharge Pain Level: 0 Discharge Condition:  Stable Ambulatory Status: Ambulatory Discharge Destination: Home Private Transportation: Auto Accompanied By: self Schedule Follow-up Appointment: Yes Medication Reconciliation completed and Yes provided to Patient/Care [name]: Clinical Summary of Care: Electronic Signature(s) Signed: 02/22/2015 4:42:55 PM By: Elliot Gurney, RN, BSN, Kim RN, BSN Entered By: Elliot Gurney, RN, BSN, Kim on 02/22/2015 08:46:20 Maurice Sanders ([id number]) -------------------------------------------------------------------------------- Lower Extremity Assessment Details Patient Name: Maurice Sanders Date of Service: 02/22/2015 8:15 AM Medical Record Number: [id number] Patient Account Number: [id number] Date of Birth/Sex: [birth year] (69 y.o. Male) Treating RN: Huel Coventry Primary Care Physician: Beverely Risen Other Clinician: Referring Physician: Beverely Risen Treating Physician/Extender: Rudene Re in Treatment: 32 Edema Assessment Assessed: [Left: No] [Right: No] E[Left: dema] [Right: :] Calf Left: Right: Point of Measurement: 33 cm From Medial Instep cm 35 cm Ankle Left: Right: Point of Measurement: 11 cm From Medial Instep cm cm Vascular Assessment Pulses: Posterior Tibial Dorsalis Pedis Palpable: [Right:Yes] Extremity colors, hair growth, and conditions: Extremity Color: [Right:Hyperpigmented] Hair Growth on Extremity: [Right:No] Temperature of Extremity: [Right:Warm] Capillary Refill: [Right:< 3 seconds] Toe Nail Assessment Left: Right: Thick: Yes Discolored: Yes Improper Length and Hygiene: No Notes Apligraft applied near ankle. Did not measure, would not be accurate. Electronic Signature(s) Signed: 02/22/2015 4:42:55 PM By: Elliot Gurney, RN, BSN, Kim RN, BSN Entered By: Elliot Gurney, RN, BSN, Kim on 02/22/2015 08:27:46 Maurice Sanders, Maurice Sanders ([id number]) Maurice Sanders, Maurice Sanders ([id number]) -------------------------------------------------------------------------------- Multi Wound Chart Details Patient Name: Maurice Sanders Date of Service: 02/22/2015 8:15 AM Medical Record Number: [id number] Patient Account Number: [id number] Date of Birth/Sex: Nov 27, 1945 (69 y.o. Male) Treating RN: Huel Coventry Primary Care Physician: Beverely Risen Other Clinician: Referring Physician: Beverely Risen Treating Physician/Extender: Rudene Re in Treatment: 32 Vital Signs Height(in): 71 Pulse(bpm): 66 Weight(lbs): 291.7 Blood Pressure 150/68 (mmHg): Body Mass Index(BMI): 41 Temperature(F): 98.1 Respiratory Rate 18 (breaths/min): Photos: [2:No Photos] [N/A:N/A] Wound Location: [2:Right Malleolus - Medial, Proximal] [N/A:N/A] Wounding Event: [2:Gradually Appeared] [N/A:N/A] Primary Etiology: [2:Venous Leg Ulcer] [N/A:N/A] Comorbid History: [2:Sleep Apnea, Congestive Heart Failure, Deep Vein  Thrombosis, Hypertension, Type II Diabetes, Gout] [N/A:N/A] Date Acquired: [2:07/31/2014] [N/A:N/A] Weeks of Treatment: [2:29] [N/A:N/A] Wound Status: [2:Open] [N/A:N/A] Measurements L x W x D 2.2x2x0.2 [N/A:N/A] (cm) Area (cm) : [2:3.456] [N/A:N/A] Volume (cm) : [2:0.691] [N/A:N/A] % Reduction in Area: [2:-388.80%] [N/A:N/A] % Reduction in Volume: -873.20% [N/A:N/A] Classification: [2:Full Thickness Without Exposed Support Structures] [N/A:N/A] HBO Classification: [2:Grade 1] [N/A:N/A] Exudate Amount: [2:Medium] [N/A:N/A] Exudate Type: [2:Serosanguineous] [N/A:N/A] Exudate Color: [2:red, brown] [N/A:N/A] Wound Margin: [2:Distinct, outline attached] [N/A:N/A] Granulation Amount: [2:Large (67-100%)] [N/A:N/A] Granulation Quality: [2:Red, Pink] [N/A:N/A] Necrotic Amount: [2:Small (1-33%)] [N/A:N/A] Exposed Structures: Fascia: No N/A N/A Fat: No Tendon: No Muscle: No Joint: No Bone: No Limited to Skin Breakdown Epithelialization: Medium (34-66%) N/A N/A Periwound Skin Texture: Scarring: Yes N/A N/A Edema: No Excoriation: No Induration: No Callus: No Crepitus: No Fluctuance: No Friable: No Rash:  No Periwound Skin Moist: Yes N/A N/A Moisture: Dry/Scaly: Yes Maceration: No Periwound Skin Color: Hemosiderin Staining: Yes N/A N/A Atrophie Blanche: No Cyanosis: No Ecchymosis: No Erythema: No Mottled: No Pallor: No Rubor: No Temperature: No Abnormality N/A N/A Tenderness on No N/A N/A Palpation: Wound Preparation: Ulcer Cleansing: N/A N/A Rinsed/Irrigated with Saline, Other: water and soap Topical Anesthetic Applied: Other: lidocaine 4% Assessment Notes: Week 1 after Apligraf N/A N/A application. Dressing removed down to contact layer only. Used same measurements as last week due to inability to get to wound. Treatment Notes Maurice Sanders, Maurice Sanders ([id number]) Electronic Signature(s) Signed: 02/22/2015 4:42:55 PM By: Elliot Gurney, RN, BSN, Kim RN, BSN Entered By: Elliot Gurney, RN, BSN, Kim on 02/22/2015 08:43:04 Maurice Sanders ([id number]) -------------------------------------------------------------------------------- Multi-Disciplinary Care Plan Details Patient Name: Maurice Sanders Date of Service: 02/22/2015 8:15 AM Medical Record Number: [id number] Patient Account Number: [id number] Date of Birth/Sex: February 20, 1946 (69 y.o. Male) Treating RN: Huel Coventry Primary Care Physician: Beverely Risen Other Clinician: Referring Physician: Beverely Risen Treating Physician/Extender: Rudene Re in Treatment: 14 Active Inactive Abuse / Safety / Falls / Self Care Management Nursing Diagnoses: Potential for falls Goals: Patient will remain injury free Date Initiated: 07/10/2014 Goal Status: Active Interventions: Assess fall risk on admission and as needed Notes: Nutrition Nursing Diagnoses: Potential for alteratiion in Nutrition/Potential for imbalanced nutrition Goals: Patient/caregiver agrees to and verbalizes understanding of need to use nutritional supplements and/or vitamins as prescribed Date Initiated: 07/10/2014 Goal Status: Active Interventions: Assess patient nutrition upon  admission and as needed per policy Notes: Orientation to the Wound Care Program Nursing Diagnoses: Knowledge deficit related to the wound healing center program Goals: Patient/caregiver will verbalize understanding of the Wound Healing Center Program Maurice Sanders, Maurice Sanders ([id number]) Date Initiated: 07/10/2014 Goal Status: Active Interventions: Provide education on orientation to the wound center Notes: Wound/Skin Impairment Nursing Diagnoses: Impaired tissue integrity Goals: Ulcer/skin breakdown will have a volume reduction of 30% by week 4 Date Initiated: 07/10/2014 Goal Status: Active Interventions: Assess ulceration(s) every visit Notes: Electronic Signature(s) Signed: 02/22/2015 4:42:55 PM By: Elliot Gurney, RN, BSN, Kim RN, BSN Entered By: Elliot Gurney, RN, BSN, Kim on 02/22/2015 08:42:53 Maurice Sanders ([id number]) -------------------------------------------------------------------------------- Pain Assessment Details Patient Name: Maurice Sanders Date of Service: 02/22/2015 8:15 AM Medical Record Number: [id number] Patient Account Number: [id number] Date of Birth/Sex: 01/16/1946 (69 y.o. Male) Treating RN: Huel Coventry Primary Care Physician: Beverely Risen Other Clinician: Referring Physician: Beverely Risen Treating Physician/Extender: Rudene Re in Treatment: 32 Active Problems Location of Pain Severity and Description of Pain Patient Has Paino No Site Locations Pain Management and Medication Current Pain Management: Electronic Signature(s) Signed: 02/22/2015 4:42:55 PM By: Elliot Gurney, RN, BSN, Kim RN, BSN  Entered By: Elliot Gurney, RN, BSN, Kim on 02/22/2015 16:10:96 Maurice Sanders ([id number]) -------------------------------------------------------------------------------- Patient/Caregiver Education Details Patient Name: Maurice Sanders Date of Service: 02/22/2015 8:15 AM Medical Record Number: [id number] Patient Account Number: [id number] Date of Birth/Gender: [birth year] (69 y.o. Male) Treating  RN: Huel Coventry Primary Care Physician: Beverely Risen Other Clinician: Referring Physician: Beverely Risen Treating Physician/Extender: Rudene Re in Treatment: 34 Education Assessment Education Provided To: Patient Education Topics Provided Wound/Skin Impairment: Handouts: Other: leave wrap in place until next visit. Electronic Signature(s) Signed: 02/22/2015 4:42:55 PM By: Elliot Gurney, RN, BSN, Kim RN, BSN Entered By: Elliot Gurney, RN, BSN, Kim on 02/22/2015 08:46:41 Maurice Sanders ([id number]) -------------------------------------------------------------------------------- Wound Assessment Details Patient Name: Maurice Sanders Date of Service: 02/22/2015 8:15 AM Medical Record Number: [id number] Patient Account Number: [id number] Date of Birth/Sex: 12-Apr-1946 (69 y.o. Male) Treating RN: Huel Coventry Primary Care Physician: Beverely Risen Other Clinician: Referring Physician: Beverely Risen Treating Physician/Extender: Rudene Re in Treatment: 32 Wound Status Wound Number: 2 Primary Venous Leg Ulcer Etiology: Wound Location: Right Malleolus - Medial, Proximal Wound Open Status: Wounding Event: Gradually Appeared Comorbid Sleep Apnea, Congestive Heart Failure, Date Acquired: 07/31/2014 History: Deep Vein Thrombosis, Hypertension, Weeks Of Treatment: 29 Type II Diabetes, Gout Clustered Wound: No Photos Photo Uploaded By: Elliot Gurney, RN, BSN, Kim on 02/22/2015 11:23:07 Wound Measurements Length: (cm) 2.2 Width: (cm) 2 Depth: (cm) 0.2 Area: (cm) 3.456 Volume: (cm) 0.691 % Reduction in Area: -388.8% % Reduction in Volume: -873.2% Epithelialization: Medium (34-66%) Wound Description Full Thickness Without Exposed Foul Odor A Classification: Support Structures Diabetic Severity Grade 1 (Wagner): Wound Margin: Distinct, outline attached Exudate Amount: Medium Exudate Type: Serosanguineous Exudate Color: red, brown fter Cleansing: No Wound Bed Granulation Amount: Large  (67-100%) Exposed Structure Maurice Sanders, Maurice Sanders ([id number]) Granulation Quality: Red, Pink Fascia Exposed: No Necrotic Amount: Small (1-33%) Fat Layer Exposed: No Necrotic Quality: Adherent Slough Tendon Exposed: No Muscle Exposed: No Joint Exposed: No Bone Exposed: No Limited to Skin Breakdown Periwound Skin Texture Texture Color No Abnormalities Noted: No No Abnormalities Noted: No Callus: No Atrophie Blanche: No Crepitus: No Cyanosis: No Excoriation: No Ecchymosis: No Fluctuance: No Erythema: No Friable: No Hemosiderin Staining: Yes Induration: No Mottled: No Localized Edema: No Pallor: No Rash: No Rubor: No Scarring: Yes Temperature / Pain Moisture Temperature: No Abnormality No Abnormalities Noted: No Dry / Scaly: Yes Maceration: No Moist: Yes Wound Preparation Ulcer Cleansing: Rinsed/Irrigated with Saline, Other: water and soap, Topical Anesthetic Applied: Other: lidocaine 4%, Assessment Notes Week 1 after Apligraf application. Dressing removed down to contact layer only. Used same measurements as last week due to inability to get to wound. Treatment Notes Wound #2 (Right, Proximal, Medial Malleolus) 1. Cleansed with: Clean wound with Normal Saline 2. Anesthetic Topical Lidocaine 4% cream to wound bed prior to debridement 4. Dressing Applied: Petroleum gauze 7. Secured with 4-Layer Compression System - Right Lower Extremity Notes Maurice Sanders, Maurice Sanders ([id number]) nothing below mepilex touched since apligraf was applied last week in clinic, vasaline gauze applied to entire leg under wrap. Electronic Signature(s) Signed: 02/22/2015 4:42:55 PM By: Elliot Gurney, RN, BSN, Kim RN, BSN Entered By: Elliot Gurney, RN, BSN, Kim on 02/22/2015 08:31:39 Maurice Sanders ([id number]) -------------------------------------------------------------------------------- Vitals Details Patient Name: Maurice Sanders Date of Service: 02/22/2015 8:15 AM Medical Record Number: [id number] Patient  Account Number: [id number] Date of Birth/Sex: 10-21-1945 (69 y.o. Male) Treating RN: Huel Coventry Primary Care Physician: Beverely Risen Other Clinician: Referring Physician: Beverely Risen Treating Physician/Extender: Rudene Re in Treatment: 32 Vital Signs Time Taken: 08:19 Temperature (F): 98.1 Height (  in): 71 Pulse (bpm): 66 Weight (lbs): 291.7 Respiratory Rate (breaths/min): 18 Body Mass Index (BMI): 40.7 Blood Pressure (mmHg): 150/68 Reference Range: 80 - 120 mg / dl Electronic Signature(s) Signed: 02/22/2015 4:42:55 PM By: Elliot Gurney, RN, BSN, Kim RN, BSN Entered By: Elliot Gurney, RN, BSN, Kim on 02/22/2015 08:24:45

## 2015-02-27 ENCOUNTER — Encounter: Payer: Medicare Other | Admitting: Surgery

## 2015-02-27 DIAGNOSIS — E11620 Type 2 diabetes mellitus with diabetic dermatitis: Secondary | ICD-10-CM | POA: Diagnosis not present

## 2015-02-27 DIAGNOSIS — L97311 Non-pressure chronic ulcer of right ankle limited to breakdown of skin: Secondary | ICD-10-CM | POA: Diagnosis not present

## 2015-02-27 DIAGNOSIS — I83013 Varicose veins of right lower extremity with ulcer of ankle: Secondary | ICD-10-CM | POA: Diagnosis not present

## 2015-02-27 DIAGNOSIS — I87311 Chronic venous hypertension (idiopathic) with ulcer of right lower extremity: Secondary | ICD-10-CM | POA: Diagnosis not present

## 2015-02-27 NOTE — Progress Notes (Addendum)
Maurice, Sanders ([id number]) Visit Report for 02/27/2015 Chief Complaint Document Details Patient Name: Maurice Sanders, Maurice Sanders Date of Service: 02/27/2015 11:45 AM Medical Record Number: [id number] Patient Account Number: [id number] Date of Birth/Sex: Jan 02, 1946 (69 y.o. Male) Treating RN: Primary Care Physician: Beverely Risen Other Clinician: Referring Physician: Beverely Risen Treating Physician/Extender: Rudene Re in Treatment: 51 Information Obtained from: Patient Chief Complaint Hinton has been coming for dressing changes twice a week, on Monday and Thursday. Over all he is doing fine he has his blood sugar under control and is managing his diuretics well. after changing over from calamine to zinc oxide he said his own Unna's boot still gave him a bit of itching and problems and he had significant swelling which made the Unna's boot fairly tight. Electronic Signature(s) Signed: 02/27/2015 12:34:36 PM By: Evlyn Kanner MD, FACS Entered By: Evlyn Kanner on 02/27/2015 12:34:35 Maurice Sanders ([id number]) -------------------------------------------------------------------------------- Cellular or Tissue Based Product Details Patient Name: Maurice Sanders Date of Service: 02/27/2015 11:45 AM Medical Record Number: [id number] Patient Account Number: [id number] Date of Birth/Sex: May 30, 1946 (69 y.o. Male) Treating RN: Primary Care Physician: Beverely Risen Other Clinician: Referring Physician: Beverely Risen Treating Physician/Extender: Rudene Re in Treatment: 33 Cellular or Tissue Based Wound #2 Right,Proximal,Medial Malleolus Product Type Applied to: Performed By: Physician Tristan Schroeder., MD Cellular or Tissue Based Apligraf Product Type: Time-Out Taken: Yes Location: trunk / arms / legs Wound Size (sq cm): 2.55 Product Size (sq cm): 44 Waste Size (sq cm): 40 Waste Reason: wound size Amount of Product Applied (sq cm): 4 Lot #: [id number] Order #: [id number] Expiration  Date: 03/01/2015 Fenestrated: Yes Instrument: Blade Reconstituted: Yes Solution Type: sodium chloride Solution Amount: 10ml Lot #: [id number] Solution Expiration 11/02/2016 Date: Secured: Yes Secured With: Steri-Strips Dressing Applied: Yes Primary Dressing: Mepitel Procedural Pain: 0 Post Procedural Pain: 0 Response to Treatment: Procedure was tolerated well Electronic Signature(s) Signed: 02/27/2015 12:34:27 PM By: Evlyn Kanner MD, FACS Entered By: Evlyn Kanner on 02/27/2015 12:34:27 Maurice Sanders ([id number]) -------------------------------------------------------------------------------- HPI Details Patient Name: Maurice Sanders Date of Service: 02/27/2015 11:45 AM Medical Record Number: [id number] Patient Account Number: [id number] Date of Birth/Sex: August 29, 1945 (69 y.o. Male) Treating RN: Primary Care Physician: Beverely Risen Other Clinician: Referring Physician: Beverely Risen Treating Physician/Extender: Rudene Re in Treatment: 33 History of Present Illness Location: right lower extremity Quality: Patient reports experiencing a dull pain to affected area(s). Severity: patient denies any severe pain. Duration: he has had this wound for a few weeks now. Timing: Pain in wound is Intermittent (comes and goes Context: the patient has a long history of venous insufficiency. He has been very compliant with wearing his compression stockings. Modifying Factors: the patient normally wears compression stockings. Recently he had placed a fair amount of Vaseline which cause skin irritation. Associated Signs and Symptoms: Patient denies any fevers. He has had a moderate amount of drainage. HPI Description: The patient is 69 year-old male with a history of diabetes who presents with right lower extremity wound. He first presented to our clinic on 07/10/2014. He does have a history of venous insufficiency in the past. He has been very compliant with wearing his compression stockings. He  recently Vaseline on his legs which caused skin irritation and breakdown. The patient returns for follow-up today. he denies any fevers at home. However due to the fact that he had stopped his diuretic he has had a lot of edema of the lower extremity. He also has a lot of scaly skin on his right lower  extremity. 10/02/14 -- he has resumed his diuretics on a regular basis and has been walking around adequately and has overall improved. No pain or no discharge from the wounds. 10/09/14 -- the patient has been having some pain at night while sleeping and was requesting pain medications. He also says that due to taking his diuretics he's been getting some cramps and I have asked him to please see his PCP regarding both these problems. Also awaiting insurance clearance regarding his Apligraf. addendum: I have now got a clear copy of his vascular workup studies and have reviewed the entire details dated encounter 08/17/2014. the lower extremity venous duplex study done on 08/17/2014 revealed no evidence of deep vein thrombosis or superficial thrombophlebitis bilaterally, no incompetence of the greater small saphenous veins bilaterally, enlarged lymph nodes in the groin bilaterally. As seen by Dr. Gilda Crease on the same day. Dr. Gilda Crease recommended that he should continue wearing graduated compression stockings of class I which is 20-30 mmHg pressure on a daily basis and a prescription was given. He was to be reassessed in 6 months and the question of getting him lymphedema pumps were also discussed. 10/16/2014. Vascular note reviewed in detail. I have been able to review his notes from Dr. Gilda Crease on 08/17/2014. 1.The venous duplex study done on 08/17/2014 showed that all veins visualized showed no evidence of DVT. 2. no incompetence of the greater small saphenous veins bilaterally. LULA, MICHAUX ([id number]) The plan was that he had no surgery or intervention needed at this point. The long discussion  had showed that the patient had chronic skin changes accompanied by venous insufficiency and at this stage he would benefit from wearing graduated compression stockings of the 20-30 mmHg on a daily basis and review would be done in his office back again in 6 months. At that time the patient could have a lymph pump. except for the new abrasion on the right lower extremity just below his tibial tuberosity he is doing fine and his ulcers actually look much better. 10/23/14 -- Culture report on Maurice Sanders --- His wound culture has grown Citrobacter koseri and Enterococcus faecalis. Final report is back but so far sensitive to Bactrim and ampicillin and ciprofloxacin. Bactrim DS was called in for 14 days. 11/13/2014 last week we changed his Unna boot on Monday and Thursday and this has helped him a lot. The edema has been much better the Unna boot hasn't slipped down and he has not had any skin abrasions. 11/20/2014 -- he saw the vascular surgeon Dr. Gilda Crease this morning and he was pleased with his progress and has said he would be ordering him some lymphedema pumps. Other than that the patient is coming to change his Unna's boots twice a week and he is doing well with this. 11/27/2014 -- over Saturday and Sunday he started having more swelling in his leg and some pain and I believe his own Unna's boot caused him some discomfort and excoriation of skin. He does admit he took some more juices orally but he has continued his diuretic. 12/11/2014 -- he has completed his course of antibiotics and has had no fresh issues. He has had no pain no swelling and is managing well with his blood sugars and diuretics. 12/14/2014 between Monday and today he has had some dietary indiscretions and had some [place] food which may have made him retain a lot of fluid. His edema has increased markedly and a lot of more excoriation. 12/25/2014 -- This week he has really done  well and his edema is under control and  these had no pain or problems. His leg is feeling good. 01/04/2015 -- He has done great along the way and he has managed to keep his wrap for almost her entire week. No issues or problems. 02/01/2015 -- he is doing fine and easier for his first application of the Apligraf skin substitute. 02/08/2015 -- he had a lot of drainage to the dressing over the Apligraf and this will be addressed today. 02/15/2015 -- his wound looks very good and there was not much of drainage. He will have a second application of Apligraf. 02/22/2015 -- his Apligraf is in place and he has significant drainage. 02/27/2015 -- he is here for his third application of Apligraf Electronic Signature(s) Signed: 02/27/2015 12:35:04 PM By: Evlyn Kanner MD, FACS Entered By: Evlyn Kanner on 02/27/2015 12:35:04 TYWAUN, HILTNER ([id number]) SAVOY, SOMERVILLE ([id number]) -------------------------------------------------------------------------------- Physical Exam Details Patient Name: Maurice Sanders Date of Service: 02/27/2015 11:45 AM Medical Record Number: [id number] Patient Account Number: [id number] Date of Birth/Sex: August 17, 1945 (69 y.o. Male) Treating RN: Primary Care Physician: Beverely Risen Other Clinician: Referring Physician: Beverely Risen Treating Physician/Extender: Rudene Re in Treatment: 33 Constitutional . Pulse regular. Respirations normal and unlabored. Afebrile. . Eyes Nonicteric. Reactive to light. Ears, Nose, Mouth, and Throat Lips, teeth, and gums WNL.Marland Kitchen Moist mucosa without lesions . Neck supple and nontender. No palpable supraclavicular or cervical adenopathy. Normal sized without goiter. Respiratory WNL. No retractions.. Cardiovascular Pedal Pulses WNL. No clubbing, cyanosis or edema. Chest Breasts symmetical and no nipple discharge.. Breast tissue WNL, no masses, lumps, or tenderness.. Musculoskeletal Adexa without tenderness or enlargement.. Digits and nails w/o clubbing, cyanosis, infection,  petechiae, ischemia, or inflammatory conditions.. Integumentary (Hair, Skin) No suspicious lesions. No crepitus or fluctuance. No peri-wound warmth or erythema. No masses.Marland Kitchen Psychiatric Judgement and insight Intact.. No evidence of depression, anxiety, or agitation.. Notes the wound looks excellent and it is ready for the next application of Apligraf. Electronic Signature(s) Signed: 02/27/2015 12:35:35 PM By: Evlyn Kanner MD, FACS Entered By: Evlyn Kanner on 02/27/2015 12:35:35 Maurice Sanders ([id number]) -------------------------------------------------------------------------------- Physician Orders Details Patient Name: Maurice Sanders Date of Service: 02/27/2015 11:45 AM Medical Record Number: [id number] Patient Account Number: [id number] Date of Birth/Sex: 10/22/1945 (69 y.o. 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 Anesthetic Wound #2 Right,Proximal,Medial Malleolus o Topical Lidocaine 4% cream applied to wound bed prior to debridement Primary Wound Dressing Wound #2 Right,Proximal,Medial Malleolus o Other: - Mepitel secured by steri-strips - apligraf applied by Dr Meyer Russel today - do not remove below meptiel next week Secondary Dressing Wound #2 Right,Proximal,Medial Malleolus o Drawtex o ABD pad Dressing Change Frequency Wound #2 Right,Proximal,Medial Malleolus o Change dressing every  week Follow-up Appointments Wound #2 Right,Proximal,Medial Malleolus o Return Appointment in 1 week. Edema Control o 4-Layer Compression System - Right Lower Extremity YONATHAN, PERROW ([id number]) Advanced Therapies Wound #2 Right,Proximal,Medial Malleolus o Apligraf application in clinic; including contact layer, fixation with steri strips, dry gauze and cover dressing. Electronic Signature(s) Signed: 02/27/2015 3:54:23 PM By: Evlyn Kanner MD, FACS  Signed: 02/27/2015 5:02:25 PM By: Elliot Gurney RN, BSN, Kim RN, BSN Previous Signature: 02/27/2015 12:36:54 PM Version By: Evlyn Kanner MD, FACS Entered By: Elliot Gurney RN, BSN, Kim on 02/27/2015 12:48:17 Maurice Sanders ([id number]) -------------------------------------------------------------------------------- Problem List Details Patient Name: Maurice Sanders Date of Service: 02/27/2015 11:45 AM Medical Record Number: [id number] Patient Account Number: [id number] Date of Birth/Sex: 1945-11-21 (69 y.o. 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 Male) Treating RN: Primary Care Physician: Beverely Risen Other Clinician: Referring Physician: Beverely Risen Treating Physician/Extender: Rudene Re in Treatment: 33 Subjective Chief Complaint Information obtained from Patient Yahsir has been coming for dressing changes twice a week, on Monday and Thursday. Over all he is doing fine he has his blood sugar under control and is managing his diuretics well. after changing over from calamine to zinc oxide he said his own Unna's boot still gave him a bit of itching and problems and he had significant swelling which made the Unna's boot fairly tight. History of Present Illness (HPI) The following HPI elements were documented for the patient's wound: Location: right lower extremity Quality: Patient reports experiencing a dull pain to affected area(s). Severity: patient denies any severe pain. Duration: he has had this wound for a few weeks now. Timing: Pain in wound is Intermittent (comes and goes Context: the patient has a long history of venous insufficiency. He has been very compliant with wearing his compression stockings. Modifying Factors: the patient normally wears compression stockings. Recently he had placed a fair amount of Vaseline which cause skin irritation. Associated Signs and Symptoms: Patient denies any fevers. He has had a moderate amount of drainage. The patient is 69 year-old male with a history of diabetes who presents with right lower extremity wound. He first presented to our clinic on 07/10/2014. He does have a history of venous insufficiency in the past. He has been very compliant with wearing his compression stockings. He recently Vaseline on his legs which caused skin irritation and breakdown. The patient returns for follow-up today. he denies any fevers at home. However due to the fact that he had stopped his diuretic he has had a lot of edema of the lower extremity. He also has a lot of  scaly skin on his right lower extremity. 10/02/14 -- he has resumed his diuretics on a regular basis and has been walking around adequately and has overall improved. No pain or no discharge from the wounds. 10/09/14 -- the patient has been having some pain at night while sleeping and was requesting pain medications. He also says that due to taking his diuretics he's been getting some cramps and I have asked him to please see his PCP regarding both these problems. Also awaiting insurance clearance regarding his Apligraf. addendum: I have now got a clear copy of his vascular workup studies and have reviewed the entire details  dated encounter 08/17/2014. the lower extremity venous duplex study done on 08/17/2014 revealed no evidence of deep vein thrombosis or superficial thrombophlebitis bilaterally, no incompetence of the greater small saphenous veins bilaterally, enlarged lymph nodes in the groin bilaterally. KEVONTAY, BURKS ([id number]) As seen by Dr. Gilda Crease on the same day. Dr. Gilda Crease recommended that he should continue wearing graduated compression stockings of class I which is 20-30 mmHg pressure on a daily basis and a prescription was given. He was to be reassessed in 6 months and the question of getting him lymphedema pumps were also discussed. 10/16/2014. Vascular note reviewed in detail. I have been able to review his notes from Dr. Gilda Crease on 08/17/2014. 1.The venous duplex study done on 08/17/2014 showed that all veins visualized showed no evidence of DVT. 2. no incompetence of the greater small saphenous veins bilaterally. The plan was that he had no surgery or intervention needed at this point. The long discussion had showed that the patient had chronic skin changes accompanied by venous insufficiency and at this stage he would benefit from wearing graduated compression stockings of the 20-30 mmHg on a daily basis and review would be done in his office back again in 6 months. At that  time the patient could have a lymph pump. except for the new abrasion on the right lower extremity just below his tibial tuberosity he is doing fine and his ulcers actually look much better. 10/23/14 -- Culture report on Maurice Sanders --- His wound culture has grown Citrobacter koseri and Enterococcus faecalis. Final report is back but so far sensitive to Bactrim and ampicillin and ciprofloxacin. Bactrim DS was called in for 14 days. 11/13/2014 last week we changed his Unna boot on Monday and Thursday and this has helped him a lot. The edema has been much better the Unna boot hasn't slipped down and he has not had any skin abrasions. 11/20/2014 -- he saw the vascular surgeon Dr. Gilda Crease this morning and he was pleased with his progress and has said he would be ordering him some lymphedema pumps. Other than that the patient is coming to change his Unna's boots twice a week and he is doing well with this. 11/27/2014 -- over Saturday and Sunday he started having more swelling in his leg and some pain and I believe his own Unna's boot caused him some discomfort and excoriation of skin. He does admit he took some more juices orally but he has continued his diuretic. 12/11/2014 -- he has completed his course of antibiotics and has had no fresh issues. He has had no pain no swelling and is managing well with his blood sugars and diuretics. 12/14/2014 between Monday and today he has had some dietary indiscretions and had some [place] food which may have made him retain a lot of fluid. His edema has increased markedly and a lot of more excoriation. 12/25/2014 -- This week he has really done well and his edema is under control and these had no pain or problems. His leg is feeling good. 01/04/2015 -- He has done great along the way and he has managed to keep his wrap for almost her entire week. No issues or problems. 02/01/2015 -- he is doing fine and easier for his first application of the Apligraf  skin substitute. 02/08/2015 -- he had a lot of drainage to the dressing over the Apligraf and this will be addressed today. JERRYL, HOLZHAUER ([id number]) 02/15/2015 -- his wound looks very good and there was not much of drainage. He will  have a second application of Apligraf. 02/22/2015 -- his Apligraf is in place and he has significant drainage. 02/27/2015 -- he is here for his third application of Apligraf Objective Constitutional Pulse regular. Respirations normal and unlabored. Afebrile. Vitals Time Taken: 12:09 PM, Height: 71 in, Weight: 291.7 lbs, BMI: 40.7, Temperature: 98.1 F, Respiratory Rate: 18 breaths/min. Eyes Nonicteric. Reactive to light. Ears, Nose, Mouth, and Throat Lips, teeth, and gums WNL.Marland Kitchen Moist mucosa without lesions . Neck supple and nontender. No palpable supraclavicular or cervical adenopathy. Normal sized without goiter. Respiratory WNL. No retractions.. Cardiovascular Pedal Pulses WNL. No clubbing, cyanosis or edema. Chest Breasts symmetical and no nipple discharge.. Breast tissue WNL, no masses, lumps, or tenderness.. Musculoskeletal Adexa without tenderness or enlargement.. Digits and nails w/o clubbing, cyanosis, infection, petechiae, ischemia, or inflammatory conditions.Marland Kitchen Psychiatric Judgement and insight Intact.. No evidence of depression, anxiety, or agitation.. General Notes: the wound looks excellent and it is ready for the next application of Apligraf. Integumentary (Hair, Skin) No suspicious lesions. No crepitus or fluctuance. No peri-wound warmth or erythema. No masses.Marland Kitchen SHED, NIXON ([id number]) Wound #2 status is Open. Original cause of wound was Gradually Appeared. The wound is located on the Right,Proximal,Medial Malleolus. The wound measures 1.5cm length x 1.7cm width x 0.2cm depth; 2.003cm^2 area and 0.401cm^3 volume. The wound is limited to skin breakdown. There is no tunneling or undermining noted. There is a medium amount of  serosanguineous drainage noted. The wound margin is distinct with the outline attached to the wound base. There is large (67-100%) red, pink granulation within the wound bed. There is a small (1-33%) amount of necrotic tissue within the wound bed including Adherent Slough. The periwound skin appearance exhibited: Scarring, Dry/Scaly, Moist, Hemosiderin Staining. The periwound skin appearance did not exhibit: Callus, Crepitus, Excoriation, Fluctuance, Friable, Induration, Localized Edema, Rash, Maceration, Atrophie Blanche, Cyanosis, Ecchymosis, Mottled, Pallor, Rubor, Erythema. Periwound temperature was noted as No Abnormality. 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 He will come back and see her Next week for a wound check Procedures Wound #2 Wound #2 is a Venous Leg Ulcer located on the Right,Proximal,Medial Malleolus. A skin graft procedure using a bioengineered skin substitute/cellular or tissue based product was performed by Rai Severns, Ignacia Felling., MD. Apligraf was applied and secured with Steri-Strips. 4 sq cm of product was utilized and 40 sq cm was wasted due to wound size. Post Application, Mepitel was applied. A Time Out was conducted prior to the start of the procedure. The procedure was tolerated well with a pain level of 0 throughout and a pain level of 0 following the procedure. Plan [place], [place] ([id number]) Wound Cleansing: Wound #2 Right,Proximal,Medial Malleolus: Clean wound with Normal Saline. Anesthetic: Wound #2 Right,Proximal,Medial Malleolus: Topical Lidocaine 4% cream applied to wound bed prior to debridement Primary Wound  Dressing: Wound #2 Right,Proximal,Medial Malleolus: Other: - Mepitel secured by steri-strips - apligraf applied by Dr Meyer Russel today - do not remove below meptiel next week Secondary Dressing: Wound #2 Right,Proximal,Medial Malleolus: Drawtex ABD pad Dressing Change Frequency: Wound #2 Right,Proximal,Medial Malleolus: Change dressing every week Follow-up Appointments: Wound #2 Right,Proximal,Medial Malleolus: Return Appointment in 1 week. Edema Control: 4-Layer Compression System - Right Lower Extremity Advanced Therapies: Wound #2  Right,Proximal,Medial Malleolus: Apligraf application in clinic; including contact layer, fixation with steri strips, dry gauze and cover dressing. The third application of Apligraf was placed in the usual sterile fashion and appropriate for layer wrap was applied over this. He will come back and see her Next week for a wound check Electronic Signature(s) Signed: 02/27/2015 3:55:12 PM By: Evlyn Kanner MD, FACS Previous Signature: 02/27/2015 12:36:21 PM Version By: Evlyn Kanner MD, FACS Entered By: Evlyn Kanner on 02/27/2015 15:55:11 Maurice Sanders ([id number]) -------------------------------------------------------------------------------- SuperBill Details Patient Name: Maurice Sanders Date of Service: 02/27/2015 Medical Record Number: [id number] Patient Account Number: [id number] Date of Birth/Sex: May 18, 1946 (69 y.o. 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 ulcer of ankle Facility Procedures CPT4 Code Description: 78295621 (Facility Use Only) Apligraf 1 SQ CM Modifier: Quantity:  44 CPT4 Code Description: 30865784 15271 - SKIN SUB GRAFT TRNK/ARM/LEG ICD-10 Description Diagnosis I87.311 Chronic venous hypertension (idiopathic) with ulcer I83.013 Varicose veins of right lower extremity with ulcer o Modifier: of right lowe f ankle Quantity: 1 r extremity Physician Procedures CPT4 Code Description: 6962952 15271 - WC PHYS SKIN SUB GRAFT TRNK/ARM/LEG ICD-10 Description Diagnosis I87.311 Chronic venous hypertension (idiopathic) with ulcer o I83.013 Varicose veins of right lower extremity with ulcer of Modifier: f right lower ankle Quantity: 1 extremity Electronic Signature(s) Signed: 02/27/2015 12:36:40 PM By: Evlyn Kanner MD, FACS Entered By: Evlyn Kanner on 02/27/2015 12:36:40

## 2015-02-28 NOTE — Progress Notes (Signed)
Maurice Sanders ([id number]) Visit Report for 02/27/2015 Arrival Information Details Patient Name: Maurice Sanders, Maurice Sanders Date of Service: 02/27/2015 11:45 AM Medical Record Number: [id number] Patient Account Number: [id number] Date of Birth/Sex: [birth year] (69 y.o. Male) Treating RN: Curtis Sites Primary Care Physician: Beverely Risen Other Clinician: Referring Physician: Beverely Risen Treating Physician/Extender: Rudene Re in Treatment: 38 Visit Information History Since Last Visit Added or deleted any medications: No Patient Arrived: Ambulatory Any new allergies or adverse reactions: No Arrival Time: 12:02 Had a fall or experienced change in No Accompanied By: self activities of daily living that may affect Transfer Assistance: None risk of falls: Patient Identification Verified: Yes Signs or symptoms of abuse/neglect since last No Secondary Verification Process Yes visito Completed: Hospitalized since last visit: No Patient Requires Transmission- No Pain Present Now: No Based Precautions: Patient Has Alerts: Yes Patient Alerts: Patient on Blood Thinner DM II ABI L 1.06; R 1.13 Electronic Signature(s) Signed: 02/27/2015 4:46:32 PM By: Curtis Sites Entered By: Curtis Sites on 02/27/2015 12:10:09 Maurice Sanders ([id number]) -------------------------------------------------------------------------------- Encounter Discharge Information Details Patient Name: Maurice Sanders Date of Service: 02/27/2015 11:45 AM Medical Record Number: [id number] Patient Account Number: [id number] Date of Birth/Sex: Feb 15, 1946 (69 y.o. Male) Treating RN: Huel Coventry Primary Care Physician: Beverely Risen Other Clinician: Referring Physician: Beverely Risen Treating Physician/Extender: Rudene Re in Treatment: 56 Encounter Discharge Information Items Discharge Pain Level: 0 Discharge Condition: Stable Ambulatory Status: Ambulatory Discharge Destination:  Home Private Transportation: Auto Accompanied By: self Schedule Follow-up Appointment: Yes Medication Reconciliation completed and Yes provided to Patient/Care Geralda Baumgardner: Clinical Summary of Care: Electronic Signature(s) Signed: 02/27/2015 5:02:25 PM By: Elliot Gurney, RN, BSN, Kim RN, BSN Entered By: Elliot Gurney, RN, BSN, Kim on 02/27/2015 12:51:32 Maurice Sanders ([id number]) -------------------------------------------------------------------------------- Lower Extremity Assessment Details Patient Name: Maurice Sanders Date of Service: 02/27/2015 11:45 AM Medical Record Number: [id number] Patient Account Number: [id number] Date of Birth/Sex: 12/30/1945 (69 y.o. Male) Treating RN: Curtis Sites Primary Care Physician: Beverely Risen Other Clinician: Referring Physician: Beverely Risen Treating Physician/Extender: Rudene Re in Treatment: 33 Edema Assessment Assessed: [Left: No] [Right: No] E[Left: dema] [Right: :] Calf Left: Right: Point of Measurement: 33 cm From Medial Instep cm 36.4 cm Ankle Left: Right: Point of Measurement: 11 cm From Medial Instep cm 25 cm Vascular Assessment Pulses: Posterior Tibial Dorsalis Pedis Palpable: [Right:Yes] Extremity colors, hair growth, and conditions: Extremity Color: [Right:Hyperpigmented] Hair Growth on Extremity: [Right:No] Temperature of Extremity: [Right:Warm] Capillary Refill: [Right:< 3 seconds] Toe Nail Assessment Left: Right: Thick: Yes Discolored: Yes Deformed: Yes Improper Length and Hygiene: No Electronic Signature(s) Signed: 02/27/2015 4:46:32 PM By: Curtis Sites Entered By: Curtis Sites on 02/27/2015 12:19:23 Maurice Sanders ([id number]) -------------------------------------------------------------------------------- Multi Wound Chart Details Patient Name: Maurice Sanders Date of Service: 02/27/2015 11:45 AM Medical Record Number: [id number] Patient Account Number: [id number] Date of Birth/Sex: 02-14-1946 (69 y.o.  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] [N/A:N/A] Comorbid History: [2:Sleep Apnea, Congestive Heart Failure, Deep Vein Thrombosis, Hypertension, Type II Diabetes, Gout] [N/A:N/A] Date Acquired: [2:07/31/2014] [N/A:N/A] Weeks of Treatment: [2:30] [N/A:N/A] Wound Status: [2:Open] [N/A:N/A] Measurements L x W x D 1.5x1.7x0.2 [N/A:N/A] (cm) Area (cm) : [2:2.003] [N/A:N/A] Volume (cm) : [2:0.401] [N/A:N/A] %  Reduction in Area: [2:-183.30%] [N/A:N/A] % Reduction in Volume: -464.80% [N/A:N/A] Classification: [2:Full Thickness Without Exposed Support Structures] [N/A:N/A] HBO Classification: [2:Grade 1] [N/A:N/A] Exudate Amount: [2:Medium] [N/A:N/A] Exudate Type: [2:Serosanguineous] [N/A:N/A] Exudate Color: [2:red, brown] [N/A:N/A] Wound Margin: [2:Distinct, outline attached] [N/A:N/A] Granulation Amount: [2:Large (67-100%)] [N/A:N/A] Granulation Quality: [2:Red, Pink] [N/A:N/A] Necrotic Amount: [2:Small (1-33%)] [N/A:N/A] Exposed Structures: Fascia: No N/A N/A Fat: No Tendon: No Muscle: No Joint: No Bone: No Limited to Skin Breakdown Epithelialization: Medium (34-66%) N/A N/A Periwound Skin Texture: Scarring: Yes N/A N/A Edema: No Excoriation: No Induration: No Callus: No Crepitus: No Fluctuance: No Friable: No Rash: No Periwound Skin Moist: Yes N/A N/A Moisture: Dry/Scaly: Yes Maceration: No Periwound Skin Color: Hemosiderin Staining: Yes N/A N/A Atrophie Blanche:  No Cyanosis: No Ecchymosis: No Erythema: No Mottled: No Pallor: No Rubor: No Temperature: No Abnormality N/A N/A Tenderness on No N/A N/A Palpation: Wound Preparation: Ulcer Cleansing: Other: N/A N/A water and soap Topical Anesthetic Applied: Other: lidocaine 4% Procedures Performed: Cellular or Tissue Based N/A N/A Product Treatment Notes Wound #2 (Right, Proximal, Medial Malleolus) 1. Cleansed with: Clean wound with Normal Saline 2. Anesthetic Topical Lidocaine 4% cream to wound bed prior to debridement 7. Secured with 4-Layer Compression System - Right Lower Extremity [place], [place] ([id number]) Notes Apligraf applied, drawtex and abd over mepiitel. vasaline gauze applied to entire leg under wrap. Electronic Signature(s) Signed: 02/27/2015 4:39:34 PM By: Curtis Sites Entered By: Curtis Sites on 02/27/2015 16:39:34 Maurice Sanders ([id number]) -------------------------------------------------------------------------------- Multi-Disciplinary Care Plan Details Patient Name: Maurice Sanders Date of Service: 02/27/2015 11:45 AM Medical Record Number: [id number] Patient Account Number: [id number] Date of Birth/Sex: [birth year] (69 y.o. Male) Treating RN: Curtis Sites Primary Care Physician: Beverely Risen Other Clinician: Referring Physician: Beverely Risen Treating Physician/Extender: Rudene Re in Treatment: 32 Active Inactive Abuse / Safety / Falls / Self Care Management Nursing Diagnoses: Potential for falls Goals: Patient will remain injury free Date Initiated: 07/10/2014 Goal Status: Active Interventions: Assess fall risk on admission and as needed Notes: Nutrition Nursing Diagnoses: Potential for alteratiion in Nutrition/Potential for imbalanced nutrition Goals: Patient/caregiver agrees to and verbalizes understanding of need to use nutritional supplements and/or vitamins as prescribed Date Initiated: 07/10/2014 Goal Status:  Active Interventions: Assess patient nutrition upon admission and as needed per policy Notes: Orientation to the Wound Care Program Nursing Diagnoses: Knowledge deficit related to the wound healing center program Goals: Patient/caregiver will verbalize understanding of the Wound Healing Center Program [place], [place] ([id number]) Date Initiated: 07/10/2014 Goal Status: Active Interventions: Provide education on orientation to the wound center Notes: Wound/Skin Impairment Nursing Diagnoses: Impaired tissue integrity Goals: Ulcer/skin breakdown will have a volume reduction of 30% by week 4 Date Initiated: 07/10/2014 Goal Status: Active Interventions: Assess ulceration(s) every visit Notes: Electronic Signature(s) Signed: 02/27/2015 4:46:32 PM By: Curtis Sites Entered By: Curtis Sites on 02/27/2015 12:22:55 Maurice Sanders ([id number]) -------------------------------------------------------------------------------- Patient/Caregiver Education Details Patient Name: Maurice Sanders Date of Service: 02/27/2015 11:45 AM Medical Record Number: [id number] Patient Account Number: [id number] Date of Birth/Gender: October 24, 1945 (69 y.o. Male) Treating RN: Huel Coventry Primary Care Physician: Beverely Risen Other Clinician: Referring Physician: Beverely Risen Treating Physician/Extender: Rudene Re in Treatment: 62 Education Assessment Education Provided To: Patient Education Topics Provided Wound/Skin Impairment: Handouts: Other: continue wound care as prescribed Electronic Signature(s) Signed: 02/27/2015 5:02:25 PM By: Elliot Gurney, RN, BSN, Kim RN, BSN Entered By: Elliot Gurney, RN, BSN, Kim on 02/27/2015 12:51:51 Maurice Sanders ([id number]) -------------------------------------------------------------------------------- Wound Assessment Details Patient Name: Maurice Sanders Date of Service: 02/27/2015 11:45 AM Medical Record  Number: [id number] Patient Account Number: [id number] Date of Birth/Sex:  Dec 15, 1945 (69 y.o. Male) Treating RN: Curtis Sites Primary Care Physician: Beverely Risen Other Clinician: Referring Physician: Beverely Risen Treating Physician/Extender: Rudene Re in Treatment: 18 Wound Status Wound Number: 2 Primary Venous Leg Ulcer Etiology: Wound Location: Right Malleolus - Medial, Proximal Wound Open Status: Wounding Event: Gradually Appeared Comorbid Sleep Apnea, Congestive Heart Failure, Date Acquired: 07/31/2014 History: Deep Vein Thrombosis, Hypertension, Weeks Of Treatment: 30 Type II Diabetes, Gout Clustered Wound: No Photos Photo Uploaded By: Curtis Sites on 02/27/2015 16:44:50 Wound Measurements Length: (cm) 1.5 Width: (cm) 1.7 Depth: (cm) 0.2 Area: (cm) 2.003 Volume: (cm) 0.401 % Reduction in Area: -183.3% % Reduction in Volume: -464.8% Epithelialization: Medium (34-66%) Tunneling: No Undermining: No Wound Description Full Thickness Without Exposed Foul Odor A Classification: Support Structures Diabetic Severity Grade 1 (Wagner): Wound Margin: Distinct, outline attached Exudate Amount: Medium Exudate Type: Serosanguineous Exudate Color: red, brown fter Cleansing: No Wound Bed Granulation Amount: Large (67-100%) Exposed Structure Rauf, [place] ([id number]) Granulation Quality: Red, Pink Fascia Exposed: No Necrotic Amount: Small (1-33%) Fat Layer Exposed: No Necrotic Quality: Adherent Slough Tendon Exposed: No Muscle Exposed: No Joint Exposed: No Bone Exposed: No Limited to Skin Breakdown Periwound Skin Texture Texture Color No Abnormalities Noted: No No Abnormalities Noted: No Callus: No Atrophie Blanche: No Crepitus: No Cyanosis: No Excoriation: No Ecchymosis: No Fluctuance: No Erythema: No Friable: No Hemosiderin Staining: Yes Induration: No Mottled: No Localized Edema: No Pallor: No Rash: No Rubor: No Scarring: Yes Temperature / Pain Moisture Temperature: No Abnormality No Abnormalities Noted:  No Dry / Scaly: Yes Maceration: No Moist: Yes Wound Preparation Ulcer Cleansing: Other: water and soap, Topical Anesthetic Applied: Other: lidocaine 4%, Treatment Notes Wound #2 (Right, Proximal, Medial Malleolus) 1. Cleansed with: Clean wound with Normal Saline 2. Anesthetic Topical Lidocaine 4% cream to wound bed prior to debridement 7. Secured with 4-Layer Compression System - Right Lower Extremity Notes Apligraf applied, drawtex and abd over mepiitel. vasaline gauze applied to entire leg under wrap. Electronic Signature(s) Signed: 02/27/2015 4:46:32 PM By: Curtis Sites Entered By: Curtis Sites on 02/27/2015 12:22:42 Maurice Sanders ([id number]) -------------------------------------------------------------------------------- Vitals Details Patient Name: Maurice Sanders Date of Service: 02/27/2015 11:45 AM Medical Record Number: [id number] Patient Account Number: [id number] Date of Birth/Sex: May 22, 1946 (68 y.o. Male) Treating RN: Curtis Sites Primary Care Physician: Beverely Risen Other Clinician: Referring Physician: Beverely Risen Treating Physician/Extender: Rudene Re in Treatment: 51 Vital Signs Time Taken: 12:09 Temperature (F): 98.1 Height (in): 71 Respiratory Rate (breaths/min): 18 Weight (lbs): 291.7 Reference Range: 80 - 120 mg / dl Body Mass Index (BMI): 40.7 Electronic Signature(s) Signed: 02/27/2015 4:46:32 PM By: Curtis Sites Entered By: Curtis Sites on 02/27/2015 12:17:46

## 2015-03-01 DIAGNOSIS — R0602 Shortness of breath: Secondary | ICD-10-CM | POA: Diagnosis not present

## 2015-03-01 DIAGNOSIS — R011 Cardiac murmur, unspecified: Secondary | ICD-10-CM | POA: Diagnosis not present

## 2015-03-02 DIAGNOSIS — G4733 Obstructive sleep apnea (adult) (pediatric): Secondary | ICD-10-CM | POA: Diagnosis not present

## 2015-03-06 ENCOUNTER — Encounter: Payer: Medicare Other | Attending: Surgery | Admitting: Surgery

## 2015-03-06 DIAGNOSIS — I83013 Varicose veins of right lower extremity with ulcer of ankle: Secondary | ICD-10-CM | POA: Insufficient documentation

## 2015-03-06 DIAGNOSIS — R600 Localized edema: Secondary | ICD-10-CM | POA: Diagnosis not present

## 2015-03-06 DIAGNOSIS — L97311 Non-pressure chronic ulcer of right ankle limited to breakdown of skin: Secondary | ICD-10-CM | POA: Insufficient documentation

## 2015-03-06 DIAGNOSIS — I87311 Chronic venous hypertension (idiopathic) with ulcer of right lower extremity: Secondary | ICD-10-CM | POA: Diagnosis not present

## 2015-03-06 DIAGNOSIS — E11620 Type 2 diabetes mellitus with diabetic dermatitis: Secondary | ICD-10-CM | POA: Insufficient documentation

## 2015-03-06 NOTE — Progress Notes (Signed)
RAHEEN, CAPILI ([id number]) Visit Report for 03/06/2015 Chief Complaint Document Details Patient Name: Maurice Sanders, Maurice Sanders Date of Service: 03/06/2015 11:00 AM Medical Record Number: [id number] Patient Account Number: [id number] Date of Birth/Sex: Sep 23, 1945 (69 y.o. Male) Treating RN: Clover Mealy, RN, BSN, [place] Sink Primary Care Physician: Beverely Risen Other Clinician: Referring Physician: Beverely Risen Treating Physician/Extender: Rudene Re in Treatment: 62 Information Obtained from: Patient Chief Complaint [place] has been coming for dressing changes twice a week, on Monday and Thursday. Over all he is doing fine he has his blood sugar under control and is managing his diuretics well. after changing over from calamine to zinc oxide he said his own Unna's boot still gave him a bit of itching and problems and he had significant swelling which made the Unna's boot fairly tight. Electronic Signature(s) Signed: 03/06/2015 11:52:54 AM By: Evlyn Kanner MD, FACS Entered By: Evlyn Kanner on 03/06/2015 11:52:54 Maurice Sanders ([id number]) -------------------------------------------------------------------------------- HPI Details Patient Name: Maurice Sanders Date of Service: 03/06/2015 11:00 AM Medical Record Number: [id number] Patient Account Number: [id number] Date of Birth/Sex: 03-28-1946 (69 y.o. Male) Treating RN: Clover Mealy, RN, BSN, [place] Sink Primary Care Physician: Beverely Risen Other Clinician: Referring Physician: Beverely Risen Treating Physician/Extender: Rudene Re in Treatment: 71 History of Present Illness Location: right lower extremity Quality: Patient reports experiencing a dull pain to affected area(s). Severity: patient denies any severe pain. Duration: he has had this wound for a few weeks now. Timing: Pain in wound is Intermittent (comes and goes Context: the patient has a long history of venous insufficiency. He has been very compliant with wearing his compression stockings. Modifying  Factors: the patient normally wears compression stockings. Recently he had placed a fair amount of Vaseline which cause skin irritation. Associated Signs and Symptoms: Patient denies any fevers. He has had a moderate amount of drainage. HPI Description: The patient is 69 year-old male with a history of diabetes who presents with right lower extremity wound. He first presented to our clinic on 07/10/2014. He does have a history of venous insufficiency in the past. He has been very compliant with wearing his compression stockings. He recently Vaseline on his legs which caused skin irritation and breakdown. The patient returns for follow-up today. he denies any fevers at home. However due to the fact that he had stopped his diuretic he has had a lot of edema of the lower extremity. He also has a lot of scaly skin on his right lower extremity. 10/02/14 -- he has resumed his diuretics on a regular basis and has been walking around adequately and has overall improved. No pain or no discharge from the wounds. 10/09/14 -- the patient has been having some pain at night while sleeping and was requesting pain medications. He also says that due to taking his diuretics he's been getting some cramps and I have asked him to please see his PCP regarding both these problems. Also awaiting insurance clearance regarding his Apligraf. addendum: I have now got a clear copy of his vascular workup studies and have reviewed the entire details dated encounter 08/17/2014. the lower extremity venous duplex study done on 08/17/2014 revealed no evidence of deep vein thrombosis or superficial thrombophlebitis bilaterally, no incompetence of the greater small saphenous veins bilaterally, enlarged lymph nodes in the groin bilaterally. As seen by Dr. Gilda Crease on the same day. Dr. Gilda Crease recommended that he should continue wearing graduated compression stockings of class I which is 20-30 mmHg pressure on a daily basis and  a prescription was given. He was to  be reassessed in 6 months and the question of getting him lymphedema pumps were also discussed. 10/16/2014. Vascular note reviewed in detail. I have been able to review his notes from Dr. Gilda Crease on 08/17/2014. 1.The venous duplex study done on 08/17/2014 showed that all veins visualized showed no evidence of DVT. 2. no incompetence of the greater small saphenous veins bilaterally. JOHNCARLO, MAALOUF ([id number]) The plan was that he had no surgery or intervention needed at this point. The long discussion had showed that the patient had chronic skin changes accompanied by venous insufficiency and at this stage he would benefit from wearing graduated compression stockings of the 20-30 mmHg on a daily basis and review would be done in his office back again in 6 months. At that time the patient could have a lymph pump. except for the new abrasion on the right lower extremity just below his tibial tuberosity he is doing fine and his ulcers actually look much better. 10/23/14 -- Culture report on Maurice Sanders --- His wound culture has grown Citrobacter koseri and Enterococcus faecalis. Final report is back but so far sensitive to Bactrim and ampicillin and ciprofloxacin. Bactrim DS was called in for 14 days. 11/13/2014 last week we changed his Unna boot on Monday and Thursday and this has helped him a lot. The edema has been much better the Unna boot hasn't slipped down and he has not had any skin abrasions. 11/20/2014 -- he saw the vascular surgeon Dr. Gilda Crease this morning and he was pleased with his progress and has said he would be ordering him some lymphedema pumps. Other than that the patient is coming to change his Unna's boots twice a week and he is doing well with this. 11/27/2014 -- over Saturday and Sunday he started having more swelling in his leg and some pain and I believe his own Unna's boot caused him some discomfort and excoriation of skin. He does  admit he took some more juices orally but he has continued his diuretic. 12/11/2014 -- he has completed his course of antibiotics and has had no fresh issues. He has had no pain no swelling and is managing well with his blood sugars and diuretics. 12/14/2014 between Monday and today he has had some dietary indiscretions and had some [place] food which may have made him retain a lot of fluid. His edema has increased markedly and a lot of more excoriation. 12/25/2014 -- This week he has really done well and his edema is under control and these had no pain or problems. His leg is feeling good. 01/04/2015 -- He has done great along the way and he has managed to keep his wrap for almost her entire week. No issues or problems. 02/01/2015 -- he is doing fine and easier for his first application of the Apligraf skin substitute. 02/08/2015 -- he had a lot of drainage to the dressing over the Apligraf and this will be addressed today. 02/15/2015 -- his wound looks very good and there was not much of drainage. He will have a second application of Apligraf. 02/22/2015 -- his Apligraf is in place and he has significant drainage. 02/27/2015 -- he is here for his third application of Apligraf 03/06/2015 -- he is doing well and is here for a wound check. He did have significant drainage this week too. Electronic Signature(s) Signed: 03/06/2015 11:53:31 AM By: Evlyn Kanner MD, FACS Climax, Mykeal ([id number]) Entered By: Evlyn Kanner on 03/06/2015 11:53:30 Maurice Sanders ([id number]) -------------------------------------------------------------------------------- Physical Exam Details Patient Name: Maurice Sanders Date  of Service: 03/06/2015 11:00 AM Medical Record Number: [id number] Patient Account Number: [id number] Date of Birth/Sex: Jan 18, 1946 (69 y.o. Male) Treating RN: Clover Mealy, RN, BSN, [place] Sink Primary Care Physician: Beverely Risen Other Clinician: Referring Physician: Beverely Risen Treating  Physician/Extender: Rudene Re in Treatment: 34 Constitutional . Pulse regular. Respirations normal and unlabored. Afebrile. . Eyes Nonicteric. Reactive to light. Ears, Nose, Mouth, and Throat Lips, teeth, and gums WNL.Marland Kitchen Moist mucosa without lesions . Neck supple and nontender. No palpable supraclavicular or cervical adenopathy. Normal sized without goiter. Respiratory WNL. No retractions.. Cardiovascular Pedal Pulses WNL. No clubbing, cyanosis or edema. Chest Breasts symmetical and no nipple discharge.. Breast tissue WNL, no masses, lumps, or tenderness.. Lymphatic No adneopathy. No adenopathy. No adenopathy. Musculoskeletal Adexa without tenderness or enlargement.. Digits and nails w/o clubbing, cyanosis, infection, petechiae, ischemia, or inflammatory conditions.. Integumentary (Hair, Skin) No suspicious lesions. No crepitus or fluctuance. No peri-wound warmth or erythema. No masses.Marland Kitchen Psychiatric Judgement and insight Intact.. No evidence of depression, anxiety, or agitation.. Notes the wound is covered with Apligraf and Mepitel and the drainage is still significant. Electronic Signature(s) Signed: 03/06/2015 11:53:56 AM By: Evlyn Kanner MD, FACS Entered By: Evlyn Kanner on 03/06/2015 11:53:55 Maurice Sanders ([id number]) -------------------------------------------------------------------------------- Physician Orders Details Patient Name: Maurice Sanders Date of Service: 03/06/2015 11:00 AM Medical Record Number: [id number] Patient Account Number: [id number] Date of Birth/Sex: [birth year] (69 y.o. Male) Treating RN: Clover Mealy, RN, BSN, [place] Sink Primary Care Physician: Beverely Risen Other Clinician: Referring Physician: Beverely Risen Treating Physician/Extender: Rudene Re in Treatment: 23 Verbal / Phone Orders: Yes Clinician: Afful, RN, BSN, Rita Read Back and Verified: Yes Diagnosis Coding Wound Cleansing Wound #2 Right,Proximal,Medial Malleolus o Clean wound with  Normal Saline. Anesthetic Wound #2 Right,Proximal,Medial Malleolus o Topical Lidocaine 4% cream applied to wound bed prior to debridement Primary Wound Dressing Wound #2 Right,Proximal,Medial Malleolus o Other: - Mepitel secured by steri-strips - apligraf applied by Dr Meyer Russel today - do not remove below meptiel next week Secondary Dressing Wound #2 Right,Proximal,Medial Malleolus o ABD pad o Drawtex Dressing Change Frequency Wound #2 Right,Proximal,Medial Malleolus o Change dressing every week Follow-up Appointments Wound #2 Right,Proximal,Medial Malleolus o Return Appointment in 1 week. Edema Control o 4-Layer Compression System - Right Lower Extremity Advanced Therapies Wound #2 Right,Proximal,Medial Malleolus o Apligraf application in clinic; including contact layer, fixation with steri strips, dry gauze and cover dressing. KHALIN, ROYCE ([id number]) Electronic Signature(s) Signed: 03/06/2015 11:23:28 AM By: Elpidio Eric BSN, RN Signed: 03/06/2015 12:09:58 PM By: Evlyn Kanner MD, FACS Entered By: Elpidio Eric on 03/06/2015 11:23:28 Maurice Sanders ([id number]) -------------------------------------------------------------------------------- Problem List Details Patient Name: Maurice Sanders Date of Service: 03/06/2015 11:00 AM Medical Record Number: [id number] Patient Account Number: [id number] Date of Birth/Sex: 1946/04/20 (69 y.o. 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 Male) Treating RN: Clover Mealy, RN, BSN, [employer]  Care Physician: Beverely Risen Other Clinician: Referring Physician: Beverely Risen Treating Physician/Extender: Rudene Re in Treatment: 29 Subjective Chief Complaint Information obtained from Patient Saiquan has been coming for dressing changes twice a week, on Monday and Thursday. Over all he is doing fine he has his blood sugar under control and is managing his diuretics well. after changing over from calamine to zinc oxide he said his own Unna's boot still gave him a bit of itching and problems and he had significant swelling which made the Unna's boot fairly tight. History of Present Illness (HPI) The following HPI elements were documented for the patient's wound: Location: right lower extremity Quality: Patient reports experiencing a dull pain to affected area(s). Severity: patient denies any severe pain. Duration: he has had this wound for a few weeks now. Timing: Pain in wound is Intermittent (comes and goes Context: the patient has a long history of venous insufficiency. He has been very compliant with wearing his compression stockings. Modifying Factors: the patient normally wears compression stockings. Recently he had placed a fair amount of Vaseline which cause skin irritation. Associated Signs and Symptoms: Patient denies any fevers. He has had a moderate amount of drainage. The patient is 69 year-old male with a history of diabetes who presents with right lower  extremity wound. He first presented to our clinic on 07/10/2014. He does have a history of venous insufficiency in the past. He has been very compliant with wearing his compression stockings. He recently Vaseline on his legs which caused skin irritation and breakdown. The patient returns for follow-up today. he denies any fevers at home. However due to the fact that he had stopped his diuretic he has had a lot of edema of the lower extremity. He also has a lot of scaly skin on his right lower extremity. 10/02/14 -- he has resumed his diuretics on a regular basis and has been walking around adequately and has overall improved. No pain or no discharge from the wounds. 10/09/14 -- the patient has been having some pain at night while sleeping and was requesting pain medications. He also says that due to taking his diuretics he's been getting some cramps and I have asked him to please see his PCP regarding both these problems. Also awaiting insurance clearance regarding his Apligraf. addendum: I have now got a clear copy of his vascular workup studies and have reviewed the entire details dated encounter 08/17/2014. the lower extremity venous duplex study done on 08/17/2014 revealed no evidence of deep vein thrombosis or superficial thrombophlebitis bilaterally, no incompetence of the greater small saphenous veins bilaterally, enlarged lymph nodes in the groin bilaterally. ZACKRY, DEINES ([id number]) As seen by Dr. Gilda Crease on the same day. Dr. Gilda Crease recommended that he should continue wearing graduated compression stockings of class I which is 20-30 mmHg pressure on a daily basis and a prescription was given. He was to be reassessed in 6 months and the question of getting him lymphedema pumps were also discussed. 10/16/2014. Vascular note reviewed in detail. I have been able to review his notes from Dr. Gilda Crease on 08/17/2014. 1.The venous duplex study done on 08/17/2014 showed that all veins  visualized showed no evidence of DVT. 2. no incompetence of the greater small saphenous veins bilaterally. The plan was that he had no surgery or intervention needed at this point. The long discussion had showed that the patient had chronic skin changes accompanied by venous insufficiency and at this stage he would benefit from wearing graduated compression stockings  of the 20-30 mmHg on a daily basis and review would be done in his office back again in 6 months. At that time the patient could have a lymph pump. except for the new abrasion on the right lower extremity just below his tibial tuberosity he is doing fine and his ulcers actually look much better. 10/23/14 -- Culture report on Maurice Sanders --- His wound culture has grown Citrobacter koseri and Enterococcus faecalis. Final report is back but so far sensitive to Bactrim and ampicillin and ciprofloxacin. Bactrim DS was called in for 14 days. 11/13/2014 last week we changed his Unna boot on Monday and Thursday and this has helped him a lot. The edema has been much better the Unna boot hasn't slipped down and he has not had any skin abrasions. 11/20/2014 -- he saw the vascular surgeon Dr. Gilda Crease this morning and he was pleased with his progress and has said he would be ordering him some lymphedema pumps. Other than that the patient is coming to change his Unna's boots twice a week and he is doing well with this. 11/27/2014 -- over Saturday and Sunday he started having more swelling in his leg and some pain and I believe his own Unna's boot caused him some discomfort and excoriation of skin. He does admit he took some more juices orally but he has continued his diuretic. 12/11/2014 -- he has completed his course of antibiotics and has had no fresh issues. He has had no pain no swelling and is managing well with his blood sugars and diuretics. 12/14/2014 between Monday and today he has had some dietary indiscretions and had some [place]  food which may have made him retain a lot of fluid. His edema has increased markedly and a lot of more excoriation. 12/25/2014 -- This week he has really done well and his edema is under control and these had no pain or problems. His leg is feeling good. 01/04/2015 -- He has done great along the way and he has managed to keep his wrap for almost her entire week. No issues or problems. 02/01/2015 -- he is doing fine and easier for his first application of the Apligraf skin substitute. 02/08/2015 -- he had a lot of drainage to the dressing over the Apligraf and this will be addressed today. LAURIE, LOVEJOY ([id number]) 02/15/2015 -- his wound looks very good and there was not much of drainage. He will have a second application of Apligraf. 02/22/2015 -- his Apligraf is in place and he has significant drainage. 02/27/2015 -- he is here for his third application of Apligraf 03/06/2015 -- he is doing well and is here for a wound check. He did have significant drainage this week too. Objective Constitutional Pulse regular. Respirations normal and unlabored. Afebrile. Vitals Time Taken: 11:14 AM, Height: 71 in, Weight: 291.7 lbs, BMI: 40.7, Temperature: 97.9 F, Pulse: 67 bpm, Respiratory Rate: 17 breaths/min, Blood Pressure: 153/56 mmHg. Eyes Nonicteric. Reactive to light. Ears, Nose, Mouth, and Throat Lips, teeth, and gums WNL.Marland Kitchen Moist mucosa without lesions . Neck supple and nontender. No palpable supraclavicular or cervical adenopathy. Normal sized without goiter. Respiratory WNL. No retractions.. Cardiovascular Pedal Pulses WNL. No clubbing, cyanosis or edema. Chest Breasts symmetical and no nipple discharge.. Breast tissue WNL, no masses, lumps, or tenderness.. Lymphatic No adneopathy. No adenopathy. No adenopathy. Musculoskeletal Adexa without tenderness or enlargement.. Digits and nails w/o clubbing, cyanosis, infection, petechiae, ischemia, or inflammatory  conditions.Marland Kitchen Psychiatric Judgement and insight Intact.. No evidence of depression, anxiety, or agitation.Marland Kitchen Riven[place]ark,  Aadan ([id number]) General Notes: the wound is covered with Apligraf and Mepitel and the drainage is still significant. Integumentary (Hair, Skin) No suspicious lesions. No crepitus or fluctuance. No peri-wound warmth or erythema. No masses.. Wound #2 status is Open. Original cause of wound was Gradually Appeared. The wound is located on the Right,Proximal,Medial Malleolus. The wound measures 1.5cm length x 1.7cm width x 0.2cm depth; 2.003cm^2 area and 0.401cm^3 volume. The wound is limited to skin breakdown. There is no tunneling or undermining noted. There is a medium amount of serosanguineous drainage noted. The wound margin is distinct with the outline attached to the wound base. There is large (67-100%) red, pink granulation within the wound bed. There is a small (1-33%) amount of necrotic tissue within the wound bed including Adherent Slough. The periwound skin appearance exhibited: Scarring, Dry/Scaly, Moist, Hemosiderin Staining. The periwound skin appearance did not exhibit: Callus, Crepitus, Excoriation, Fluctuance, Friable, Induration, Localized Edema, Rash, Maceration, Atrophie Blanche, Cyanosis, Ecchymosis, Mottled, Pallor, Rubor, Erythema. Periwound temperature was noted as No Abnormality. 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 he will come back next week for his fourth application of Apligraf. Plan Wound Cleansing: Wound #2 Right,Proximal,Medial Malleolus: Clean wound with Normal Saline. Anesthetic: Wound #2 Right,Proximal,Medial  Malleolus: Topical Lidocaine 4% cream applied to wound bed prior to debridement Primary Wound Dressing: Wound #2 Right,Proximal,Medial Malleolus: Other: - Mepitel secured by steri-strips - apligraf applied by Dr Meyer Russel today - do not remove below meptiel next week Secondary Dressing: YOTAM, RHINE ([id number]) Wound #2 Right,Proximal,Medial Malleolus: ABD pad Drawtex Dressing Change Frequency: Wound #2 Right,Proximal,Medial Malleolus: Change dressing every week Follow-up Appointments: Wound #2 Right,Proximal,Medial Malleolus: Return Appointment in 1 week. Edema Control: 4-Layer Compression System - Right Lower Extremity Advanced Therapies: Wound #2 Right,Proximal,Medial Malleolus: Apligraf application in clinic; including contact layer, fixation with steri strips, dry gauze and cover dressing. We will leave the Apligraf in place and apply a secondary dressing with DrawTex and padding before applying a 4-layer wrap. he will come back next week for his fourth application of Apligraf. Electronic Signature(s) Signed: 03/06/2015 11:54:54 AM By: Evlyn Kanner MD, FACS Entered By: Evlyn Kanner on 03/06/2015 11:54:54 Maurice Sanders ([id number]) -------------------------------------------------------------------------------- SuperBill Details Patient Name: Maurice Sanders Date of Service: 03/06/2015 Medical Record Number: [id number] Patient Account Number: [id number] Date of Birth/Sex: 04-06-1946 (69 y.o. 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

## 2015-03-06 NOTE — Progress Notes (Signed)
LABIB, CWYNAR ([id number]) Visit Report for 03/06/2015 Arrival Information Details Patient Name: Maurice Sanders, Maurice Sanders Date of Service: 03/06/2015 11:00 AM Medical Record Number: [id number] Patient Account Number: [id number] Date of Birth/Sex: 06/10/1946 (69 y.o. Male) Treating RN: Clover Mealy, RN, BSN, [place] Sink Primary Care Physician: Beverely Risen Other Clinician: Referring Physician: Beverely Risen Treating Physician/Extender: Rudene Re in Treatment: 91 Visit Information History Since Last Visit Any new allergies or adverse reactions: No Patient Arrived: Ambulatory Signs or symptoms of abuse/neglect since last No Arrival Time: 11:12 visito Accompanied By: self Hospitalized since last visit: No Transfer Assistance: None Has Dressing in Place as Prescribed: Yes Patient Identification Verified: Yes Has Compression in Place as Prescribed: Yes Secondary Verification Process Yes Pain Present Now: No Completed: Patient Requires Transmission- No Based Precautions: Patient Has Alerts: Yes Patient Alerts: Patient on Blood Thinner DM II ABI L 1.06; R 1.13 Electronic Signature(s) Signed: 03/06/2015 11:12:18 AM By: Elpidio Eric BSN, RN Entered By: Elpidio Eric on 03/06/2015 11:12:17 Maurice Sanders ([id number]) -------------------------------------------------------------------------------- Encounter Discharge Information Details Patient Name: Maurice Sanders Date of Service: 03/06/2015 11:00 AM Medical Record Number: [id number] Patient Account Number: [id number] Date of Birth/Sex: [birth year] (69 y.o. Male) Treating RN: Clover Mealy, RN, BSN, Rita Primary Care Physician: Beverely Risen Other Clinician: Referring Physician: Beverely Risen Treating Physician/Extender: Rudene Re in Treatment: 2 Encounter Discharge Information Items Discharge Pain Level: 0 Discharge Condition: Stable Ambulatory Status: Ambulatory Discharge Destination: Home Transportation: Private Auto Accompanied By: self Schedule  Follow-up Appointment: No Medication Reconciliation completed No and provided to Patient/Care Wilberto Console: Patient Clinical Summary of Care: Declined Electronic Signature(s) Signed: 03/06/2015 12:00:46 PM By: Gwenlyn Perking Previous Signature: 03/06/2015 11:41:07 AM Version By: Elpidio Eric BSN, RN Entered By: Gwenlyn Perking on 03/06/2015 12:00:46 Maurice Sanders ([id number]) -------------------------------------------------------------------------------- Lower Extremity Assessment Details Patient Name: Maurice Sanders Date of Service: 03/06/2015 11:00 AM Medical Record Number: [id number] Patient Account Number: [id number] Date of Birth/Sex: [birth year] (69 y.o. Male) Treating RN: Clover Mealy, RN, BSN, Rita Primary Care Physician: Beverely Risen Other Clinician: Referring Physician: Beverely Risen Treating Physician/Extender: Rudene Re in Treatment: 34 Edema Assessment Assessed: [Left: No] [Right: No] E[Left: dema] [Right: :] Calf Left: Right: Point of Measurement: 33 cm From Medial Instep cm 36.5 cm Ankle Left: Right: Point of Measurement: 11 cm From Medial Instep cm 25.4 cm Vascular Assessment Claudication: Claudication Assessment [Right:None] Pulses: Posterior Tibial Dorsalis Pedis Palpable: [Right:Yes] Extremity colors, hair growth, and conditions: Extremity Color: [Right:Hyperpigmented] Hair Growth on Extremity: [Right:No] Temperature of Extremity: [Right:Warm] Capillary Refill: [Right:< 3 seconds] Toe Nail Assessment Left: Right: Thick: Yes Discolored: Yes Deformed: No Improper Length and Hygiene: No Electronic Signature(s) Signed: 03/06/2015 11:14:12 AM By: Elpidio Eric BSN, RN Entered By: Elpidio Eric on 03/06/2015 11:14:11 Maurice Sanders ([id number]) [name], [name] ([id number]) -------------------------------------------------------------------------------- Multi Wound Chart Details Patient Name: Maurice Sanders Date of Service: 03/06/2015 11:00 AM Medical Record Number:  [id number] Patient Account Number: [id number] Date of Birth/Sex: [birth year] (69 y.o. Male) Treating RN: Clover Mealy, RN, BSN, Rita Primary Care Physician: Beverely Risen Other Clinician: Referring Physician: Beverely Risen Treating Physician/Extender: Rudene Re in Treatment: 34 Vital Signs Height(in): 71 Pulse(bpm): 67 Weight(lbs): 291.7 Blood Pressure 153/56 (mmHg): Body Mass Index(BMI): 41 Temperature(F): 97.9 Respiratory Rate 17 (breaths/min): Photos: [2:No Photos] [N/A:N/A] Wound Location: [2:Right Malleolus - Medial, Proximal] [N/A:N/A] Wounding Event: [2:Gradually Appeared] [N/A:N/A] Primary Etiology: [2:Venous Leg Ulcer] [N/A:N/A] Comorbid History: [2:Sleep Apnea, Congestive Heart Failure, Deep Vein Thrombosis, Hypertension, Type II Diabetes, Gout] [N/A:N/A] Date Acquired: [2:07/31/2014] [N/A:N/A] Weeks of Treatment: [2:31] [N/A:N/A] Wound Status: [2:Open] [N/A:N/A] Measurements L  x W x D 1.5x1.7x0.2 [N/A:N/A] (cm) Area (cm) : [2:2.003] [N/A:N/A] Volume (cm) : [2:0.401] [N/A:N/A] % Reduction in Area: [2:-183.30%] [N/A:N/A] % Reduction in Volume: -464.80% [N/A:N/A] Classification: [2:Full Thickness Without Exposed Support Structures] [N/A:N/A] HBO Classification: [2:Grade 1] [N/A:N/A] Exudate Amount: [2:Medium] [N/A:N/A] Exudate Type: [2:Serosanguineous] [N/A:N/A] Exudate Color: [2:red, brown] [N/A:N/A] Wound Margin: [2:Distinct, outline attached] [N/A:N/A] Granulation Amount: [2:Large (67-100%)] [N/A:N/A] Granulation Quality: [2:Red, Pink] [N/A:N/A] Necrotic Amount: [2:Small (1-33%)] [N/A:N/A] Exposed Structures: Fascia: No N/A N/A Fat: No Tendon: No Muscle: No Joint: No Bone: No Limited to Skin Breakdown Epithelialization: Medium (34-66%) N/A N/A Periwound Skin Texture: Scarring: Yes N/A N/A Edema: No Excoriation: No Induration: No Callus: No Crepitus: No Fluctuance: No Friable: No Rash: No Periwound Skin Moist: Yes N/A N/A Moisture: Dry/Scaly:  Yes Maceration: No Periwound Skin Color: Hemosiderin Staining: Yes N/A N/A Atrophie Blanche: No Cyanosis: No Ecchymosis: No Erythema: No Mottled: No Pallor: No Rubor: No Temperature: No Abnormality N/A N/A Tenderness on No N/A N/A Palpation: Wound Preparation: Ulcer Cleansing: Other: N/A N/A water and soap Topical Anesthetic Applied: Other: lidocaine 4% Treatment Notes Electronic Signature(s) Signed: 03/06/2015 11:22:29 AM By: Elpidio Eric BSN, RN Entered By: Elpidio Eric on 03/06/2015 11:22:29 Maurice Sanders ([id number]) -------------------------------------------------------------------------------- Multi-Disciplinary Care Plan Details Patient Name: Maurice Sanders Date of Service: 03/06/2015 11:00 AM Medical Record Number: [id number] Patient Account Number: [id number] Date of Birth/Sex: [birth year] (69 y.o. Male) Treating RN: Clover Mealy, RN, BSN, [place] Sink Primary Care Physician: Beverely Risen Other Clinician: Referring Physician: Beverely Risen Treating Physician/Extender: Rudene Re in Treatment: 44 Active Inactive Abuse / Safety / Falls / Self Care Management Nursing Diagnoses: Potential for falls Goals: Patient will remain injury free Date Initiated: 07/10/2014 Goal Status: Active Interventions: Assess fall risk on admission and as needed Notes: Nutrition Nursing Diagnoses: Potential for alteratiion in Nutrition/Potential for imbalanced nutrition Goals: Patient/caregiver agrees to and verbalizes understanding of need to use nutritional supplements and/or vitamins as prescribed Date Initiated: 07/10/2014 Goal Status: Active Interventions: Assess patient nutrition upon admission and as needed per policy Notes: Orientation to the Wound Care Program Nursing Diagnoses: Knowledge deficit related to the wound healing center program Goals: Patient/caregiver will verbalize understanding of the Wound Healing Center Program [place], [place] ([id number]) Date Initiated:  07/10/2014 Goal Status: Active Interventions: Provide education on orientation to the wound center Notes: Wound/Skin Impairment Nursing Diagnoses: Impaired tissue integrity Goals: Ulcer/skin breakdown will have a volume reduction of 30% by week 4 Date Initiated: 07/10/2014 Goal Status: Active Interventions: Assess ulceration(s) every visit Notes: Electronic Signature(s) Signed: 03/06/2015 11:22:20 AM By: Elpidio Eric BSN, RN Entered By: Elpidio Eric on 03/06/2015 11:22:20 Maurice Sanders ([id number]) -------------------------------------------------------------------------------- Pain Assessment Details Patient Name: Maurice Sanders Date of Service: 03/06/2015 11:00 AM Medical Record Number: [id number] Patient Account Number: [id number] Date of Birth/Sex: [birth year] (69 y.o. Male) Treating RN: Clover Mealy, RN, BSN, Rita Primary Care Physician: Beverely Risen Other Clinician: Referring Physician: Beverely Risen Treating Physician/Extender: Rudene Re in Treatment: 34 Active Problems Location of Pain Severity and Description of Pain Patient Has Paino No Site Locations Pain Management and Medication Current Pain Management: Electronic Signature(s) Signed: 03/06/2015 11:13:18 AM By: Elpidio Eric BSN, RN Entered By: Elpidio Eric on 03/06/2015 11:13:18 Maurice Sanders ([id number]) -------------------------------------------------------------------------------- Patient/Caregiver Education Details Patient Name: Maurice Sanders Date of Service: 03/06/2015 11:00 AM Medical Record Number: [id number] Patient Account Number: [id number] Date of Birth/Gender: November 16, 1945 (69 y.o. Male) Treating RN: Clover Mealy, RN, BSN, [place] Sink Primary Care Physician: Beverely Risen Other Clinician: Referring Physician: Beverely Risen Treating Physician/Extender: Rudene Re in Treatment:  34 Education Assessment Education Provided To: Patient Education Topics Provided Welcome To The Wound Care Center: Methods:  Explain/Verbal Responses: State content correctly Electronic Signature(s) Signed: 03/06/2015 11:41:18 AM By: Elpidio Eric BSN, RN Entered By: Elpidio Eric on 03/06/2015 11:41:18 Maurice Sanders ([id number]) -------------------------------------------------------------------------------- Wound Assessment Details Patient Name: Maurice Sanders Date of Service: 03/06/2015 11:00 AM Medical Record Number: [id number] Patient Account Number: [id number] Date of Birth/Sex: Dec 12, 1945 (69 y.o. Male) Treating RN: Clover Mealy, RN, BSN, Rita Primary Care Physician: Beverely Risen Other Clinician: Referring Physician: Beverely Risen Treating Physician/Extender: Rudene Re in Treatment: 34 Wound Status Wound Number: 2 Primary Venous Leg Ulcer Etiology: Wound Location: Right Malleolus - Medial, Proximal Wound Open Status: Wounding Event: Gradually Appeared Comorbid Sleep Apnea, Congestive Heart Failure, Date Acquired: 07/31/2014 History: Deep Vein Thrombosis, Hypertension, Weeks Of Treatment: 31 Type II Diabetes, Gout Clustered Wound: No Wound Measurements Length: (cm) 1.5 Width: (cm) 1.7 Depth: (cm) 0.2 Area: (cm) 2.003 Volume: (cm) 0.401 % Reduction in Area: -183.3% % Reduction in Volume: -464.8% Epithelialization: Medium (34-66%) Tunneling: No Undermining: No Wound Description Full Thickness Without Exposed Foul Odor Classification: Support Structures Diabetic Severity Grade 1 (Wagner): Wound Margin: Distinct, outline attached Exudate Amount: Medium Exudate Type: Serosanguineous Exudate Color: red, brown After Cleansing: No Wound Bed Granulation Amount: Large (67-100%) Exposed Structure Granulation Quality: Red, Pink Fascia Exposed: No Necrotic Amount: Small (1-33%) Fat Layer Exposed: No Necrotic Quality: Adherent Slough Tendon Exposed: No Muscle Exposed: No Joint Exposed: No Bone Exposed: No Limited to Skin Breakdown Periwound Skin Texture Texture Color No Abnormalities  Noted: No No Abnormalities Noted: No [place], [place] ([id number]) Callus: No Atrophie Blanche: No Crepitus: No Cyanosis: No Excoriation: No Ecchymosis: No Fluctuance: No Erythema: No Friable: No Hemosiderin Staining: Yes Induration: No Mottled: No Localized Edema: No Pallor: No Rash: No Rubor: No Scarring: Yes Temperature / Pain Moisture Temperature: No Abnormality No Abnormalities Noted: No Dry / Scaly: Yes Maceration: No Moist: Yes Wound Preparation Ulcer Cleansing: Other: water and soap, Topical Anesthetic Applied: Other: lidocaine 4%, Treatment Notes Wound #2 (Right, Proximal, Medial Malleolus) 1. Cleansed with: Cleanse wound with antibacterial soap and water 3. Peri-wound Care: Moisturizing lotion 7. Secured with 4-Layer Compression System - Right Lower Extremity Notes Apligraf remains in place, drawtex and abd over mepiitel. Electronic Signature(s) Signed: 03/06/2015 11:21:57 AM By: Elpidio Eric BSN, RN Entered By: Elpidio Eric on 03/06/2015 11:21:57 Maurice Sanders ([id number]) -------------------------------------------------------------------------------- Vitals Details Patient Name: Maurice Sanders Date of Service: 03/06/2015 11:00 AM Medical Record Number: [id number] Patient Account Number: [id number] Date of Birth/Sex: 1945-10-09 (69 y.o. Male) Treating RN: Clover Mealy, RN, BSN, Rita Primary Care Physician: Beverely Risen Other Clinician: Referring Physician: Beverely Risen Treating Physician/Extender: Rudene Re in Treatment: 34 Vital Signs Time Taken: 11:14 Temperature (F): 97.9 Height (in): 71 Pulse (bpm): 67 Weight (lbs): 291.7 Respiratory Rate (breaths/min): 17 Body Mass Index (BMI): 40.7 Blood Pressure (mmHg): 153/56 Reference Range: 80 - 120 mg / dl Electronic Signature(s) Signed: 03/06/2015 11:14:44 AM By: Elpidio Eric BSN, RN Entered By: Elpidio Eric on 03/06/2015 11:14:44

## 2015-03-15 ENCOUNTER — Encounter: Payer: Medicare Other | Admitting: Surgery

## 2015-03-15 DIAGNOSIS — R600 Localized edema: Secondary | ICD-10-CM | POA: Diagnosis not present

## 2015-03-15 DIAGNOSIS — I83013 Varicose veins of right lower extremity with ulcer of ankle: Secondary | ICD-10-CM | POA: Diagnosis not present

## 2015-03-15 DIAGNOSIS — L97311 Non-pressure chronic ulcer of right ankle limited to breakdown of skin: Secondary | ICD-10-CM | POA: Diagnosis not present

## 2015-03-15 DIAGNOSIS — E11620 Type 2 diabetes mellitus with diabetic dermatitis: Secondary | ICD-10-CM | POA: Diagnosis not present

## 2015-03-15 DIAGNOSIS — I87311 Chronic venous hypertension (idiopathic) with ulcer of right lower extremity: Secondary | ICD-10-CM | POA: Diagnosis not present

## 2015-03-15 NOTE — Progress Notes (Signed)
AVAN, GULLETT ([id number]) Visit Report for 03/15/2015 Arrival Information Details Patient Name: Maurice Sanders, Maurice Sanders Date of Service: 03/15/2015 8:00 AM Medical Record Number: [id number] Patient Account Number: [id number] Date of Birth/Sex: 01/21/1946 (69 y.o. Male) Treating RN: Clover Mealy, RN, BSN, [place] Sink Primary Care Physician: Beverely Risen Other Clinician: Referring Physician: Beverely Risen Treating Physician/Extender: Rudene Re in Treatment: 35 Visit Information History Since Last Visit Added or deleted any medications: No Patient Arrived: Ambulatory Any new allergies or adverse reactions: No Arrival Time: 08:13 Had a fall or experienced change in No Accompanied By: self activities of daily living that may affect Transfer Assistance: None risk of falls: Patient Identification Verified: Yes Signs or symptoms of abuse/neglect since last No Secondary Verification Process Yes visito Completed: Hospitalized since last visit: No Patient Requires Transmission- No Has Dressing in Place as Prescribed: Yes Based Precautions: Has Compression in Place as Prescribed: Yes Patient Has Alerts: Yes Pain Present Now: No Patient Alerts: Patient on Blood Thinner DM II ABI L 1.06; R 1.13 Electronic Signature(s) Signed: 03/15/2015 8:13:37 AM By: Elpidio Eric BSN, RN Entered By: Elpidio Eric on 03/15/2015 08:13:37 Maurice Sanders ([id number]) -------------------------------------------------------------------------------- Encounter Discharge Information Details Patient Name: Maurice Sanders Date of Service: 03/15/2015 8:00 AM Medical Record Number: [id number] Patient Account Number: [id number] Date of Birth/Sex: 1946-06-18 (69 y.o. Male) Treating RN: Clover Mealy, RN, BSN, [place] Primary Care Physician: Beverely Risen Other Clinician: Referring Physician: Beverely Risen Treating Physician/Extender: Rudene Re in Treatment: 58 Encounter Discharge Information Items Discharge Pain Level: 0 Discharge  Condition: Stable Ambulatory Status: Ambulatory Discharge Destination: Home Transportation: Private Auto Accompanied By: Clearnce Sorrel Schedule Follow-up Appointment: No Medication Reconciliation completed No and provided to Patient/Care Zynia Wojtowicz: Patient Clinical Summary of Care: Declined Electronic Signature(s) Signed: 03/15/2015 8:57:36 AM By: Gwenlyn Perking Previous Signature: 03/15/2015 8:40:57 AM Version By: Elpidio Eric BSN, RN Entered By: Gwenlyn Perking on 03/15/2015 08:57:36 Maurice Sanders ([id number]) -------------------------------------------------------------------------------- Lower Extremity Assessment Details Patient Name: Maurice Sanders Date of Service: 03/15/2015 8:00 AM Medical Record Number: [id number] Patient Account Number: [id number] Date of Birth/Sex: Sep 04, 1945 (69 y.o. Male) Treating RN: Clover Mealy, RN, BSN, Rita Primary Care Physician: Beverely Risen Other Clinician: Referring Physician: Beverely Risen Treating Physician/Extender: Rudene Re in Treatment: 35 Edema Assessment Assessed: [Left: No] [Right: No] Edema: [Left: Ye] [Right: s] Calf Left: Right: Point of Measurement: 33 cm From Medial Instep cm 36.7 cm Ankle Left: Right: Point of Measurement: 11 cm From Medial Instep cm 25.6 cm Vascular Assessment Claudication: Claudication Assessment [Right:None] Pulses: Posterior Tibial Dorsalis Pedis Palpable: [Right:Yes] Extremity colors, hair growth, and conditions: Extremity Color: [Right:Hyperpigmented] Hair Growth on Extremity: [Right:No] Temperature of Extremity: [Right:Warm] Capillary Refill: [Right:< 3 seconds] Dependent Rubor: [Right:No] Blanched when Elevated: [Right:No] Lipodermatosclerosis: [Right:No] Toe Nail Assessment Left: Right: Thick: Yes Discolored: Yes Deformed: No Improper Length and Hygiene: No DONTEE, JASO ([id number]) Electronic Signature(s) Signed: 03/15/2015 8:22:28 AM By: Elpidio Eric BSN, RN Entered By: Elpidio Eric on 03/15/2015  08:22:28 Maurice Sanders ([id number]) -------------------------------------------------------------------------------- Multi Wound Chart Details Patient Name: Maurice Sanders Date of Service: 03/15/2015 8:00 AM Medical Record Number: [id number] Patient Account Number: [id number] Date of Birth/Sex: 1945/08/08 (69 y.o. Male) Treating RN: Clover Mealy, RN, BSN, Rita Primary Care Physician: Beverely Risen Other Clinician: Referring Physician: Beverely Risen Treating Physician/Extender: Rudene Re in Treatment: 35 Vital Signs Height(in): 71 Pulse(bpm): 70 Weight(lbs): 291.7 Blood Pressure 138/78 (mmHg): Body Mass Index(BMI): 41 Temperature(F): 97.9 Respiratory Rate 18 (breaths/min): Photos: [2:No Photos] [6:No Photos] [N/A:N/A] Wound Location: [2:Right Malleolus - Medial, Proximal] [6:Malleolus - Proximal] [N/A:N/A] Wounding Event: [2:Gradually  Appeared] [6:Gradually Appeared] [N/A:N/A] Primary Etiology: [2:Venous Leg Ulcer] [6:Lymphedema] [N/A:N/A] Comorbid History: [2:Sleep Apnea, Congestive Heart Failure, Deep Vein Thrombosis, Hypertension, Type II Diabetes, Gout] [6:Sleep Apnea, Congestive Heart Failure, Deep Vein Thrombosis, Hypertension, Type II Diabetes, Gout] [N/A:N/A] Date Acquired: [2:07/31/2014] [6:03/15/2015] [N/A:N/A] Weeks of Treatment: [2:32] [6:0] [N/A:N/A] Wound Status: [2:Open] [6:Open] [N/A:N/A] Measurements L x W x D 1x1.5x0.2 [6:2x1.5x0.2] [N/A:N/A] (cm) Area (cm) : [2:1.178] [6:2.356] [N/A:N/A] Volume (cm) : [2:0.236] [6:0.471] [N/A:N/A] % Reduction in Area: [2:-66.60%] [6:0.00%] [N/A:N/A] % Reduction in Volume: -232.40% [6:0.00%] [N/A:N/A] Classification: [2:Full Thickness Without Exposed Support Structures] [6:Full Thickness Without Exposed Support Structures] [N/A:N/A] HBO Classification: [2:Grade 1] [6:Grade 1] [N/A:N/A] Exudate Amount: [2:Medium] [6:Medium] [N/A:N/A] Exudate Type: [2:Serosanguineous] [6:Serosanguineous] [N/A:N/A] Exudate Color: [2:red,  brown] [6:red, brown] [N/A:N/A] Wound Margin: [2:Distinct, outline attached] [6:Distinct, outline attached] [N/A:N/A] Granulation Amount: [2:Large (67-100%)] [6:Small (1-33%)] [N/A:N/A] Granulation Quality: [2:Red, Pink] [6:Pink, Pale] [N/A:N/A] Necrotic Amount: [2:Small (1-33%)] [6:Medium (34-66%)] [N/A:N/A] Exposed Structures: Fascia: No Fascia: No N/A Fat: No Fat: No Tendon: No Tendon: No Muscle: No Muscle: No Joint: No Joint: No Bone: No Bone: No Limited to Skin Limited to Skin Breakdown Breakdown Epithelialization: Medium (34-66%) None N/A Periwound Skin Texture: Scarring: Yes Edema: Yes N/A Edema: No Excoriation: No Excoriation: No Induration: No Induration: No Callus: No Callus: No Crepitus: No Crepitus: No Fluctuance: No Fluctuance: No Friable: No Friable: No Rash: No Rash: No Scarring: No Periwound Skin Moist: Yes Moist: Yes N/A Moisture: Dry/Scaly: Yes Maceration: No Maceration: No Dry/Scaly: No Periwound Skin Color: Hemosiderin Staining: Yes Atrophie Blanche: No N/A Atrophie Blanche: No Cyanosis: No Cyanosis: No Ecchymosis: No Ecchymosis: No Erythema: No Erythema: No Hemosiderin Staining: No Mottled: No Mottled: No Pallor: No Pallor: No Rubor: No Rubor: No Temperature: No Abnormality No Abnormality N/A Tenderness on No No N/A Palpation: Wound Preparation: Ulcer Cleansing: Other: Ulcer Cleansing: Other: N/A water and soap soap and water Topical Anesthetic Topical Anesthetic Applied: Other: lidocaine Applied: Other: lidocaine 4% 4% Treatment Notes Electronic Signature(s) Signed: 03/15/2015 8:37:30 AM By: Elpidio Eric BSN, RN Entered By: Elpidio Eric on 03/15/2015 08:37:29 Maurice Sanders ([id number]) -------------------------------------------------------------------------------- Multi-Disciplinary Care Plan Details Patient Name: Maurice Sanders Date of Service: 03/15/2015 8:00 AM Medical Record Number: [id number] Patient Account Number:  [id number] Date of Birth/Sex: February 07, 1946 (69 y.o. Male) Treating RN: Clover Mealy, RN, BSN, [place] Sink Primary Care Physician: Beverely Risen Other Clinician: Referring Physician: Beverely Risen Treating Physician/Extender: Rudene Re in Treatment: 13 Active Inactive Abuse / Safety / Falls / Self Care Management Nursing Diagnoses: Potential for falls Goals: Patient will remain injury free Date Initiated: 07/10/2014 Goal Status: Active Interventions: Assess fall risk on admission and as needed Notes: Nutrition Nursing Diagnoses: Potential for alteratiion in Nutrition/Potential for imbalanced nutrition Goals: Patient/caregiver agrees to and verbalizes understanding of need to use nutritional supplements and/or vitamins as prescribed Date Initiated: 07/10/2014 Goal Status: Active Interventions: Assess patient nutrition upon admission and as needed per policy Notes: Orientation to the Wound Care Program Nursing Diagnoses: Knowledge deficit related to the wound healing center program Goals: Patient/caregiver will verbalize understanding of the Wound Healing Center Program [place], [place] ([id number]) Date Initiated: 07/10/2014 Goal Status: Active Interventions: Provide education on orientation to the wound center Notes: Wound/Skin Impairment Nursing Diagnoses: Impaired tissue integrity Goals: Ulcer/skin breakdown will have a volume reduction of 30% by week 4 Date Initiated: 07/10/2014 Goal Status: Active Interventions: Assess ulceration(s) every visit Notes: Electronic Signature(s) Signed: 03/15/2015 8:34:42 AM By: Elpidio Eric BSN, RN Entered By: Elpidio Eric on 03/15/2015 08:34:42 Maurice Sanders ([id number]) --------------------------------------------------------------------------------  Pain Assessment Details Patient Name: ERICBERTO, PADGET Date of Service: 03/15/2015 8:00 AM Medical Record Number: [id number] Patient Account Number: [id number] Date of Birth/Sex: 1946-02-14 (69 y.o.  Male) Treating RN: Clover Mealy, RN, BSN, [place] Sink Primary Care Physician: Beverely Risen Other Clinician: Referring Physician: Beverely Risen Treating Physician/Extender: Rudene Re in Treatment: 40 Active Problems Location of Pain Severity and Description of Pain Patient Has Paino No Site Locations Pain Management and Medication Current Pain Management: Electronic Signature(s) Signed: 03/15/2015 8:13:43 AM By: Elpidio Eric BSN, RN Entered By: Elpidio Eric on 03/15/2015 08:13:43 Maurice Sanders ([id number]) -------------------------------------------------------------------------------- Patient/Caregiver Education Details Patient Name: Maurice Sanders Date of Service: 03/15/2015 8:00 AM Medical Record Number: [id number] Patient Account Number: [id number] Date of Birth/Gender: Dec 29, 1945 (69 y.o. Male) Treating RN: Clover Mealy, RN, BSN, Rita Primary Care Physician: Beverely Risen Other Clinician: Referring Physician: Beverely Risen Treating Physician/Extender: Rudene Re in Treatment: 78 Education Assessment Education Provided To: Patient Education Topics Provided Welcome To The Wound Care Center: Methods: Explain/Verbal Responses: State content correctly Electronic Signature(s) Signed: 03/15/2015 8:41:07 AM By: Elpidio Eric BSN, RN Entered By: Elpidio Eric on 03/15/2015 08:41:07 Maurice Sanders ([id number]) -------------------------------------------------------------------------------- Wound Assessment Details Patient Name: Maurice Sanders Date of Service: 03/15/2015 8:00 AM Medical Record Number: [id number] Patient Account Number: [id number] Date of Birth/Sex: [birth year] (69 y.o. Male) Treating RN: Clover Mealy, RN, BSN, Rita Primary Care Physician: Beverely Risen Other Clinician: Referring Physician: Beverely Risen Treating Physician/Extender: Rudene Re in Treatment: 35 Wound Status Wound Number: 2 Primary Venous Leg Ulcer Etiology: Wound Location: Right Malleolus - Medial, Proximal Wound  Open Status: Wounding Event: Gradually Appeared Comorbid Sleep Apnea, Congestive Heart Failure, Date Acquired: 07/31/2014 History: Deep Vein Thrombosis, Hypertension, Weeks Of Treatment: 32 Type II Diabetes, Gout Clustered Wound: No Photos Photo Uploaded By: Elpidio Eric on 03/15/2015 17:19:18 Wound Measurements Length: (cm) 1 Width: (cm) 1.5 Depth: (cm) 0.2 Area: (cm) 1.178 Volume: (cm) 0.236 % Reduction in Area: -66.6% % Reduction in Volume: -232.4% Epithelialization: Medium (34-66%) Tunneling: No Undermining: No Wound Description Full Thickness Without Exposed Foul Odor A Classification: Support Structures Diabetic Severity Grade 1 (Wagner): Wound Margin: Distinct, outline attached Exudate Amount: Medium Exudate Type: Serosanguineous Exudate Color: red, brown fter Cleansing: No Wound Bed Granulation Amount: Large (67-100%) Exposed Structure Thiemann, Wah ([id number]) Granulation Quality: Red, Pink Fascia Exposed: No Necrotic Amount: Small (1-33%) Fat Layer Exposed: No Necrotic Quality: Adherent Slough Tendon Exposed: No Muscle Exposed: No Joint Exposed: No Bone Exposed: No Limited to Skin Breakdown Periwound Skin Texture Texture Color No Abnormalities Noted: No No Abnormalities Noted: No Callus: No Atrophie Blanche: No Crepitus: No Cyanosis: No Excoriation: No Ecchymosis: No Fluctuance: No Erythema: No Friable: No Hemosiderin Staining: Yes Induration: No Mottled: No Localized Edema: No Pallor: No Rash: No Rubor: No Scarring: Yes Temperature / Pain Moisture Temperature: No Abnormality No Abnormalities Noted: No Dry / Scaly: Yes Maceration: No Moist: Yes Wound Preparation Ulcer Cleansing: Other: water and soap, Topical Anesthetic Applied: Other: lidocaine 4%, Treatment Notes Wound #2 (Right, Proximal, Medial Malleolus) 1. Cleansed with: Cleanse wound with antibacterial soap and water 3. Peri-wound Care: Barrier cream Moisturizing  lotion 7. Secured with 4-Layer Compression System - Right Lower Extremity Notes drawtex and abd Electronic Signature(s) Signed: 03/15/2015 8:23:47 AM By: Elpidio Eric BSN, RN Entered By: Elpidio Eric on 03/15/2015 08:23:46 EDRIS, FRIEDT ([id number]) KEEVIN, PANEBIANCO ([id number]) -------------------------------------------------------------------------------- Wound Assessment Details Patient Name: Maurice Sanders Date of Service: 03/15/2015 8:00 AM Medical Record Number: [id number] Patient Account Number: [id number] Date of Birth/Sex: 06/09/1946 (69 y.o. Male) Treating RN:  Afful, RN, BSN, [place] Sink Primary Care Physician: Beverely Risen Other Clinician: Referring Physician: Beverely Risen Treating Physician/Extender: Rudene Re in Treatment: 35 Wound Status Wound Number: 6 Primary Lymphedema Etiology: Wound Location: Malleolus - Proximal Wound Open Wounding Event: Gradually Appeared Status: Date Acquired: 03/15/2015 Comorbid Sleep Apnea, Congestive Heart Failure, Weeks Of Treatment: 0 History: Deep Vein Thrombosis, Hypertension, Clustered Wound: No Type II Diabetes, Gout Photos Photo Uploaded By: Elpidio Eric on 03/15/2015 17:19:19 Wound Measurements Length: (cm) 2 Width: (cm) 1.5 Depth: (cm) 0.2 Area: (cm) 2.356 Volume: (cm) 0.471 % Reduction in Area: 0% % Reduction in Volume: 0% Epithelialization: None Tunneling: No Undermining: No Wound Description Full Thickness Without Exposed Foul Odor A Classification: Support Structures Diabetic Severity Grade 1 (Wagner): Wound Margin: Distinct, outline attached Exudate Amount: Medium Exudate Type: Serosanguineous Exudate Color: red, brown fter Cleansing: No Wound Bed Granulation Amount: Small (1-33%) Exposed Structure Pherigo, Benjaman ([id number]) Granulation Quality: Pink, Pale Fascia Exposed: No Necrotic Amount: Medium (34-66%) Fat Layer Exposed: No Necrotic Quality: Adherent Slough Tendon Exposed: No Muscle Exposed:  No Joint Exposed: No Bone Exposed: No Limited to Skin Breakdown Periwound Skin Texture Texture Color No Abnormalities Noted: No No Abnormalities Noted: No Callus: No Atrophie Blanche: No Crepitus: No Cyanosis: No Excoriation: No Ecchymosis: No Fluctuance: No Erythema: No Friable: No Hemosiderin Staining: No Induration: No Mottled: No Localized Edema: Yes Pallor: No Rash: No Rubor: No Scarring: No Temperature / Pain Moisture Temperature: No Abnormality No Abnormalities Noted: No Dry / Scaly: No Maceration: No Moist: Yes Wound Preparation Ulcer Cleansing: Other: soap and water, Topical Anesthetic Applied: Other: lidocaine 4%, Treatment Notes Wound #6 (Proximal Malleolus) 1. Cleansed with: Cleanse wound with antibacterial soap and water 3. Peri-wound Care: Barrier cream Moisturizing lotion 7. Secured with 4-Layer Compression System - Right Lower Extremity Notes drawtex and abd Electronic Signature(s) Signed: 03/15/2015 8:28:27 AM By: Elpidio Eric BSN, RN Entered By: Elpidio Eric on 03/15/2015 40:98:11 Maurice Sanders ([id number]) DORAN, NESTLE ([id number]) -------------------------------------------------------------------------------- Vitals Details Patient Name: Maurice Sanders Date of Service: 03/15/2015 8:00 AM Medical Record Number: [id number] Patient Account Number: [id number] Date of Birth/Sex: 1945-10-17 (69 y.o. Male) Treating RN: Afful, RN, BSN, Rita Primary Care Physician: Beverely Risen Other Clinician: Referring Physician: Beverely Risen Treating Physician/Extender: Rudene Re in Treatment: 35 Vital Signs Time Taken: 08:21 Temperature (F): 97.9 Height (in): 71 Pulse (bpm): 70 Weight (lbs): 291.7 Respiratory Rate (breaths/min): 18 Body Mass Index (BMI): 40.7 Blood Pressure (mmHg): 138/78 Reference Range: 80 - 120 mg / dl Electronic Signature(s) Signed: 03/15/2015 8:21:50 AM By: Elpidio Eric BSN, RN Entered By: Elpidio Eric on 03/15/2015  08:21:50

## 2015-03-16 NOTE — Progress Notes (Signed)
Maurice Sanders, Maurice Sanders ([id number]) Visit Report for 03/15/2015 Chief Complaint Document Details Patient Name: Maurice Sanders Date of Service: 03/15/2015 8:00 AM Medical Record Number: [id number] Patient Account Number: [id number] Date of Birth/Sex: [birth year] (69 y.o. Male) Treating RN: Primary Care Physician: Beverely Risen Other Clinician: Referring Physician: Beverely Risen Treating Physician/Extender: Rudene Re in Treatment: 73 Information Obtained from: Patient Chief Complaint Maurice Sanders has been coming for dressing changes twice a week, on Monday and Thursday. Over all he is doing fine he has his blood sugar under control and is managing his diuretics well. after changing over from calamine to zinc oxide he said his own Unna's boot still gave him a bit of itching and problems and he had significant swelling which made the Unna's boot fairly tight. Electronic Signature(s) Signed: 03/15/2015 8:46:47 AM By: Evlyn Kanner MD, FACS Entered By: Evlyn Kanner on 03/15/2015 08:46:47 Maurice Sanders ([id number]) -------------------------------------------------------------------------------- HPI Details Patient Name: Maurice Sanders Date of Service: 03/15/2015 8:00 AM Medical Record Number: [id number] Patient Account Number: [id number] Date of Birth/Sex: September 03, 1945 (69 y.o. Male) Treating RN: Primary Care Physician: Beverely Risen Other Clinician: Referring Physician: Beverely Risen Treating Physician/Extender: Rudene Re in Treatment: 35 History of Present Illness Location: right lower extremity Quality: Patient reports experiencing a dull pain to affected area(s). Severity: patient denies any severe pain. Duration: he has had this wound for a few weeks now. Timing: Pain in wound is Intermittent (comes and goes Context: the patient has a long history of venous insufficiency. He has been very compliant with wearing his compression stockings. Modifying Factors: the patient normally wears  compression stockings. Recently he had placed a fair amount of Vaseline which cause skin irritation. Associated Signs and Symptoms: Patient denies any fevers. He has had a moderate amount of drainage. HPI Description: The patient is 69 year-old male with a history of diabetes who presents with right lower extremity wound. He first presented to our clinic on 07/10/2014. He does have a history of venous insufficiency in the past. He has been very compliant with wearing his compression stockings. He recently Vaseline on his legs which caused skin irritation and breakdown. The patient returns for follow-up today. he denies any fevers at home. However due to the fact that he had stopped his diuretic he has had a lot of edema of the lower extremity. He also has a lot of scaly skin on his right lower extremity. 10/02/14 -- he has resumed his diuretics on a regular basis and has been walking around adequately and has overall improved. No pain or no discharge from the wounds. 10/09/14 -- the patient has been having some pain at night while sleeping and was requesting pain medications. He also says that due to taking his diuretics he's been getting some cramps and I have asked him to please see his PCP regarding both these problems. Also awaiting insurance clearance regarding his Apligraf. addendum: I have now got a clear copy of his vascular workup studies and have reviewed the entire details dated encounter 08/17/2014. the lower extremity venous duplex study done on 08/17/2014 revealed no evidence of deep vein thrombosis or superficial thrombophlebitis bilaterally, no incompetence of the greater small saphenous veins bilaterally, enlarged lymph nodes in the groin bilaterally. As seen by Dr. Gilda Crease on the same day. Dr. Gilda Crease recommended that he should continue wearing graduated compression stockings of class I which is 20-30 mmHg pressure on a daily basis and a prescription was given. He was to be  reassessed in 6 months and the question  of getting him lymphedema pumps were also discussed. 10/16/2014. Vascular note reviewed in detail. I have been able to review his notes from Dr. Gilda Crease on 08/17/2014. 1.The venous duplex study done on 08/17/2014 showed that all veins visualized showed no evidence of DVT. 2. no incompetence of the greater small saphenous veins bilaterally. Maurice Sanders, Maurice Sanders ([id number]) The plan was that he had no surgery or intervention needed at this point. The long discussion had showed that the patient had chronic skin changes accompanied by venous insufficiency and at this stage he would benefit from wearing graduated compression stockings of the 20-30 mmHg on a daily basis and review would be done in his office back again in 6 months. At that time the patient could have a lymph pump. except for the new abrasion on the right lower extremity just below his tibial tuberosity he is doing fine and his ulcers actually look much better. 10/23/14 -- Culture report on Maurice Sanders --- His wound culture has grown Citrobacter koseri and Enterococcus faecalis. Final report is back but so far sensitive to Bactrim and ampicillin and ciprofloxacin. Bactrim DS was called in for 14 days. 11/13/2014 last week we changed his Unna boot on Monday and Thursday and this has helped him a lot. The edema has been much better the Unna boot hasn't slipped down and he has not had any skin abrasions. 11/20/2014 -- he saw the vascular surgeon Dr. Gilda Crease this morning and he was pleased with his progress and has said he would be ordering him some lymphedema pumps. Other than that the patient is coming to change his Unna's boots twice a week and he is doing well with this. 11/27/2014 -- over Saturday and Sunday he started having more swelling in his leg and some pain and I believe his own Unna's boot caused him some discomfort and excoriation of skin. He does admit he took some more juices orally  but he has continued his diuretic. 12/11/2014 -- he has completed his course of antibiotics and has had no fresh issues. He has had no pain no swelling and is managing well with his blood sugars and diuretics. 12/14/2014 between Monday and today he has had some dietary indiscretions and had some [place] food which may have made him retain a lot of fluid. His edema has increased markedly and a lot of more excoriation. 12/25/2014 -- This week he has really done well and his edema is under control and these had no pain or problems. His leg is feeling good. 01/04/2015 -- He has done great along the way and he has managed to keep his wrap for almost her entire week. No issues or problems. 02/01/2015 -- he is doing fine and easier for his first application of the Apligraf skin substitute. 02/08/2015 -- he had a lot of drainage to the dressing over the Apligraf and this will be addressed today. 02/15/2015 -- his wound looks very good and there was not much of drainage. He will have a second application of Apligraf. 02/22/2015 -- his Apligraf is in place and he has significant drainage. 02/27/2015 -- he is here for his third application of Apligraf 03/06/2015 -- he is doing well and is here for a wound check. He did have significant drainage this week too. 03/15/2015 -- he has done better with his wound and not had any reaction to the Profore 4-layer wrap. He does feel however that a small lace opened where the Steri-Strip was. He does not have an Apligraf ordered for him  todayMYRLE, Maurice Sanders ([id number]) Electronic Signature(s) Signed: 03/15/2015 8:49:14 AM By: Evlyn Kanner MD, FACS Entered By: Evlyn Kanner on 03/15/2015 08:49:14 Maurice Sanders ([id number]) -------------------------------------------------------------------------------- Physical Exam Details Patient Name: Maurice Sanders Date of Service: 03/15/2015 8:00 AM Medical Record Number: [id number] Patient Account Number: [id number] Date  of Birth/Sex: Aug 02, 1946 (69 y.o. Male) Treating RN: Primary Care Physician: Beverely Risen Other Clinician: Referring Physician: Beverely Risen Treating Physician/Extender: Rudene Re in Treatment: 35 Constitutional . Pulse regular. Respirations normal and unlabored. Afebrile. . Eyes Nonicteric. Reactive to light. Ears, Nose, Mouth, and Throat Lips, teeth, and gums WNL.Marland Kitchen Moist mucosa without lesions . Neck supple and nontender. No palpable supraclavicular or cervical adenopathy. Normal sized without goiter. Respiratory WNL. No retractions.. Cardiovascular Pedal Pulses WNL. No clubbing, cyanosis or edema. Lymphatic No adneopathy. No adenopathy. No adenopathy. Musculoskeletal Adexa without tenderness or enlargement.. Digits and nails w/o clubbing, cyanosis, infection, petechiae, ischemia, or inflammatory conditions.. Integumentary (Hair, Skin) No suspicious lesions. No crepitus or fluctuance. No peri-wound warmth or erythema. No masses.Marland Kitchen Psychiatric Judgement and insight Intact.. No evidence of depression, anxiety, or agitation.. Notes The patient has a small area of epithelialization on the main wound and just superior lateral to this there are a few small blisters which have opened up and caused wounds. the wound base looks clean. Electronic Signature(s) Signed: 03/15/2015 8:50:54 AM By: Evlyn Kanner MD, FACS Entered By: Evlyn Kanner on 03/15/2015 08:50:53 Maurice Sanders ([id number]) -------------------------------------------------------------------------------- Physician Orders Details Patient Name: Maurice Sanders Date of Service: 03/15/2015 8:00 AM Medical Record Number: [id number] Patient Account Number: [id number] Date of Birth/Sex: 1946/04/01 (69 y.o. Male) Treating RN: Clover Mealy, RN, BSN, [place] Sink Primary Care Physician: Beverely Risen Other Clinician: Referring Physician: Beverely Risen Treating Physician/Extender: Rudene Re in Treatment: 92 Verbal / Phone Orders:  Yes Clinician: Afful, RN, BSN, Rita Read Back and Verified: Yes Diagnosis Coding Wound Cleansing o Cleanse wound with mild soap and water o May Shower, gently pat wound dry prior to applying new dressing. o May shower with protection. Anesthetic Wound #2 Right,Proximal,Medial Malleolus o Topical Lidocaine 4% cream applied to wound bed prior to debridement Wound #6 Proximal Malleolus o Topical Lidocaine 4% cream applied to wound bed prior to debridement Skin Barriers/Peri-Wound Care Wound #2 Right,Proximal,Medial Malleolus o Barrier cream Wound #6 Proximal Malleolus o Barrier cream Primary Wound Dressing Wound #2 Right,Proximal,Medial Malleolus o Promogran Wound #6 Proximal Malleolus o Promogran Secondary Dressing Wound #2 Right,Proximal,Medial Malleolus o ABD pad o Drawtex Wound #6 Proximal Malleolus o ABD pad o Drawtex Dressing Change Frequency Wound #2 Right,Proximal,Medial Malleolus Wiegert, Demetrius ([id number]) o Change dressing every week Wound #6 Proximal Malleolus o Change dressing every week Follow-up Appointments Wound #2 Right,Proximal,Medial Malleolus o Return Appointment in 1 week. Wound #6 Proximal Malleolus o Return Appointment in 1 week. Edema Control Wound #2 Right,Proximal,Medial Malleolus o 4-Layer Compression System - Right Lower Extremity Wound #6 Proximal Malleolus o 4-Layer Compression System - Right Lower Extremity Notes order #4 apligraf Electronic Signature(s) Signed: 03/15/2015 8:39:51 AM By: Elpidio Eric BSN, RN Signed: 03/15/2015 1:29:04 PM By: Evlyn Kanner MD, FACS Entered By: Elpidio Eric on 03/15/2015 08:39:51 Maurice Sanders ([id number]) -------------------------------------------------------------------------------- Problem List Details Patient Name: Maurice Sanders Date of Service: 03/15/2015 8:00 AM Medical Record Number: [id number] Patient Account Number: [id number] Date of Birth/Sex: [birth year] (69  y.o. 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 Male) Treating RN: Primary Care Physician: Beverely Risen Other Clinician: Referring Physician: Beverely Risen Treating Physician/Extender: Rudene Re in Treatment: 35 Subjective Chief Complaint Information obtained from Patient Shawntez has been coming for dressing changes twice a week, on Monday and Thursday. Over all he is doing fine he has his blood sugar under control and is managing his diuretics well. after changing over from calamine to zinc oxide he said his own Unna's boot still gave him a bit of itching and problems and he had significant swelling which made the Unna's boot fairly tight. History of Present Illness (HPI) The following HPI elements were documented for the patient's wound: Location: right lower extremity Quality: Patient reports experiencing a dull pain to affected area(s). Severity: patient denies any severe pain. Duration: he has had this wound for a few weeks now. Timing: Pain in wound is  Intermittent (comes and goes Context: the patient has a long history of venous insufficiency. He has been very compliant with wearing his compression stockings. Modifying Factors: the patient normally wears compression stockings. Recently he had placed a fair amount of Vaseline which cause skin irritation. Associated Signs and Symptoms: Patient denies any fevers. He has had a moderate amount of drainage. The patient is 69 year-old male with a history of diabetes who presents with right lower extremity wound. He first presented to our clinic on 07/10/2014. He does have a history of venous insufficiency in the past. He has been very compliant with wearing his compression stockings. He recently Vaseline on his legs which caused skin irritation and breakdown. The patient returns for follow-up today. he denies any fevers at home. However due to the fact that he had stopped his diuretic he has had a lot of edema of the lower extremity. He also has a lot of scaly skin on his right lower extremity. 10/02/14 -- he has resumed his diuretics on a regular basis and has been walking around adequately and has overall improved. No pain or no discharge from the wounds. 10/09/14 -- the patient has been having some pain at night while sleeping and was requesting pain medications. He also says that due to taking his diuretics he's been getting some cramps and I have asked him to please see his PCP regarding both these problems. Also awaiting insurance clearance regarding his Apligraf. addendum: I have now got a clear copy of his vascular workup studies and have reviewed the entire details dated encounter 08/17/2014. the lower extremity venous duplex study done on 08/17/2014 revealed no evidence of deep vein thrombosis or superficial thrombophlebitis bilaterally, no incompetence of the greater small saphenous veins bilaterally, enlarged lymph nodes in the groin bilaterally. Maurice Sanders, Maurice Sanders ([id number]) As seen by  Dr. Gilda Crease on the same day. Dr. Gilda Crease recommended that he should continue wearing graduated compression stockings of class I which is 20-30 mmHg pressure on a daily basis and a prescription was given. He was to be reassessed in 6 months and the question of getting him lymphedema pumps were also discussed.  10/16/2014. Vascular note reviewed in detail. I have been able to review his notes from Dr. Gilda Crease on 08/17/2014. 1.The venous duplex study done on 08/17/2014 showed that all veins visualized showed no evidence of DVT. 2. no incompetence of the greater small saphenous veins bilaterally. The plan was that he had no surgery or intervention needed at this point. The long discussion had showed that the patient had chronic skin changes accompanied by venous insufficiency and at this stage he would benefit from wearing graduated compression stockings of the 20-30 mmHg on a daily basis and review would be done in his office back again in 6 months. At that time the patient could have a lymph pump. except for the new abrasion on the right lower extremity just below his tibial tuberosity he is doing fine and his ulcers actually look much better. 10/23/14 -- Culture report on Maurice Sanders --- His wound culture has grown Citrobacter koseri and Enterococcus faecalis. Final report is back but so far sensitive to Bactrim and ampicillin and ciprofloxacin. Bactrim DS was called in for 14 days. 11/13/2014 last week we changed his Unna boot on Monday and Thursday and this has helped him a lot. The edema has been much better the Unna boot hasn't slipped down and he has not had any skin abrasions. 11/20/2014 -- he saw the vascular surgeon Dr. Gilda Crease this morning and he was pleased with his progress and has said he would be ordering him some lymphedema pumps. Other than that the patient is coming to change his Unna's boots twice a week and he is doing well with this. 11/27/2014 -- over Saturday and Sunday  he started having more swelling in his leg and some pain and I believe his own Unna's boot caused him some discomfort and excoriation of skin. He does admit he took some more juices orally but he has continued his diuretic. 12/11/2014 -- he has completed his course of antibiotics and has had no fresh issues. He has had no pain no swelling and is managing well with his blood sugars and diuretics. 12/14/2014 between Monday and today he has had some dietary indiscretions and had some [place] food which may have made him retain a lot of fluid. His edema has increased markedly and a lot of more excoriation. 12/25/2014 -- This week he has really done well and his edema is under control and these had no pain or problems. His leg is feeling good. 01/04/2015 -- He has done great along the way and he has managed to keep his wrap for almost her entire week. No issues or problems. 02/01/2015 -- he is doing fine and easier for his first application of the Apligraf skin substitute. 02/08/2015 -- he had a lot of drainage to the dressing over the Apligraf and this will be addressed today. Maurice Sanders, Maurice Sanders ([id number]) 02/15/2015 -- his wound looks very good and there was not much of drainage. He will have a second application of Apligraf. 02/22/2015 -- his Apligraf is in place and he has significant drainage. 02/27/2015 -- he is here for his third application of Apligraf 03/06/2015 -- he is doing well and is here for a wound check. He did have significant drainage this week too. 03/15/2015 -- he has done better with his wound and not had any reaction to the Profore 4-layer wrap. He does feel however that a small lace opened where the Steri-Strip was. He does not have an Apligraf ordered for him today. Objective Constitutional Pulse regular. Respirations normal and  unlabored. Afebrile. Vitals Time Taken: 8:21 AM, Height: 71 in, Weight: 291.7 lbs, BMI: 40.7, Temperature: 97.9 F, Pulse: 70 bpm, Respiratory  Rate: 18 breaths/min, Blood Pressure: 138/78 mmHg. Eyes Nonicteric. Reactive to light. Ears, Nose, Mouth, and Throat Lips, teeth, and gums WNL.Marland Kitchen Moist mucosa without lesions . Neck supple and nontender. No palpable supraclavicular or cervical adenopathy. Normal sized without goiter. Respiratory WNL. No retractions.. Cardiovascular Pedal Pulses WNL. No clubbing, cyanosis or edema. Lymphatic No adneopathy. No adenopathy. No adenopathy. Musculoskeletal Adexa without tenderness or enlargement.. Digits and nails w/o clubbing, cyanosis, infection, petechiae, ischemia, or inflammatory conditions.Marland Kitchen Psychiatric Judgement and insight Intact.. No evidence of depression, anxiety, or agitation.Marland Kitchen Maurice Sanders, Maurice Sanders ([id number]) General Notes: The patient has a small area of epithelialization on the main wound and just superior lateral to this there are a few small blisters which have opened up and caused wounds. the wound base looks clean. Integumentary (Hair, Skin) No suspicious lesions. No crepitus or fluctuance. No peri-wound warmth or erythema. No masses.. Wound #2 status is Open. Original cause of wound was Gradually Appeared. The wound is located on the Right,Proximal,Medial Malleolus. The wound measures 1cm length x 1.5cm width x 0.2cm depth; 1.178cm^2 area and 0.236cm^3 volume. The wound is limited to skin breakdown. There is no tunneling or undermining noted. There is a medium amount of serosanguineous drainage noted. The wound margin is distinct with the outline attached to the wound base. There is large (67-100%) red, pink granulation within the wound bed. There is a small (1-33%) amount of necrotic tissue within the wound bed including Adherent Slough. The periwound skin appearance exhibited: Scarring, Dry/Scaly, Moist, Hemosiderin Staining. The periwound skin appearance did not exhibit: Callus, Crepitus, Excoriation, Fluctuance, Friable, Induration, Localized Edema, Rash, Maceration,  Atrophie Blanche, Cyanosis, Ecchymosis, Mottled, Pallor, Rubor, Erythema. Periwound temperature was noted as No Abnormality. Wound #6 status is Open. Original cause of wound was Gradually Appeared. The wound is located on the Proximal Malleolus. The wound measures 2cm length x 1.5cm width x 0.2cm depth; 2.356cm^2 area and 0.471cm^3 volume. The wound is limited to skin breakdown. There is no tunneling or undermining noted. There is a medium amount of serosanguineous drainage noted. The wound margin is distinct with the outline attached to the wound base. There is small (1-33%) pink, pale granulation within the wound bed. There is a medium (34-66%) amount of necrotic tissue within the wound bed including Adherent Slough. The periwound skin appearance exhibited: Localized Edema, Moist. The periwound skin appearance did not exhibit: Callus, Crepitus, Excoriation, Fluctuance, Friable, Induration, Rash, Scarring, Dry/Scaly, Maceration, Atrophie Blanche, Cyanosis, Ecchymosis, Hemosiderin Staining, Mottled, Pallor, Rubor, Erythema. Periwound temperature was noted as No Abnormality. 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 His Apligraf which was ordered has not arrived and hence we will defer the fourth application for next week. Maurice Sanders, Maurice Sanders ([id number]) Plan Wound Cleansing: Cleanse wound with mild soap and water May Shower, gently pat wound dry prior to applying new dressing. May shower with protection. Anesthetic: Wound #2 Right,Proximal,Medial Malleolus: Topical Lidocaine 4% cream applied to wound bed prior to debridement Wound #6 Proximal Malleolus: Topical Lidocaine  4% cream applied to wound bed prior to debridement Skin Barriers/Peri-Wound Care: Wound #2 Right,Proximal,Medial Malleolus:  Barrier cream Wound #6 Proximal Malleolus: Barrier cream Primary Wound Dressing: Wound #2 Right,Proximal,Medial Malleolus: Promogran Wound #6 Proximal Malleolus: Promogran Secondary Dressing: Wound #2 Right,Proximal,Medial Malleolus: ABD pad Drawtex Wound #6 Proximal Malleolus: ABD pad Drawtex Dressing Change Frequency: Wound #2 Right,Proximal,Medial Malleolus: Change dressing every week Wound #6 Proximal Malleolus: Change dressing every week Follow-up Appointments: Wound #2 Right,Proximal,Medial Malleolus: Return Appointment in 1 week. Wound #6 Proximal Malleolus: Return Appointment in 1 week. Edema Control: Wound #2 Right,Proximal,Medial Malleolus: 4-Layer Compression System - Right Lower Extremity Wound #6 Proximal Malleolus: 4-Layer Compression System - Right Lower Extremity General Notes: order #4 Maurice Sanders, Maurice Sanders ([id number]) We will use collagen on his wound today and apply a 4 layer Profore wrap, and his edema is well-controlled with it. His Apligraf which was ordered has not arrived and hence we will defer the fourth application for next week. Electronic Signature(s) Signed: 03/15/2015 8:52:35 AM By: Evlyn Kanner MD, FACS Entered By: Evlyn Kanner on 03/15/2015 08:52:35 Maurice Sanders ([id number]) -------------------------------------------------------------------------------- SuperBill Details Patient Name: Maurice Sanders Date of Service: 03/15/2015 Medical Record Number: [id number] Patient Account Number: [id number] Date of Birth/Sex: 1945-09-16 (69 y.o. 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

## 2015-03-22 ENCOUNTER — Encounter: Payer: Medicare Other | Admitting: Surgery

## 2015-03-22 DIAGNOSIS — I87311 Chronic venous hypertension (idiopathic) with ulcer of right lower extremity: Secondary | ICD-10-CM | POA: Diagnosis not present

## 2015-03-22 DIAGNOSIS — I83013 Varicose veins of right lower extremity with ulcer of ankle: Secondary | ICD-10-CM | POA: Diagnosis not present

## 2015-03-22 DIAGNOSIS — L97511 Non-pressure chronic ulcer of other part of right foot limited to breakdown of skin: Secondary | ICD-10-CM | POA: Diagnosis not present

## 2015-03-22 DIAGNOSIS — R600 Localized edema: Secondary | ICD-10-CM | POA: Diagnosis not present

## 2015-03-22 DIAGNOSIS — E11620 Type 2 diabetes mellitus with diabetic dermatitis: Secondary | ICD-10-CM | POA: Diagnosis not present

## 2015-03-22 DIAGNOSIS — L97311 Non-pressure chronic ulcer of right ankle limited to breakdown of skin: Secondary | ICD-10-CM | POA: Diagnosis not present

## 2015-03-22 NOTE — Progress Notes (Addendum)
Maurice Sanders, Maurice Sanders ([id number]) Visit Report for 03/22/2015 Arrival Information Details Patient Name: Maurice Sanders, Maurice Sanders Date of Service: 03/22/2015 8:00 AM Medical Record Number: [id number] Patient Account Number: [id number] Date of Birth/Sex: 10/18/1945 (69 y.o. Male) Treating RN: Maurice Sanders, [place] Sink Primary Care Physician: Maurice Sanders Other Clinician: Referring Physician: Beverely Sanders Treating Physician/Extender: Maurice Sanders in Treatment: 22 Visit Information History Since Last Visit Any new allergies or adverse reactions: No Patient Arrived: Ambulatory Had a fall or experienced change in No Arrival Time: 08:09 activities of daily living that may affect Accompanied By: self/apligraf rep risk of falls: Transfer Assistance: None Signs or symptoms of abuse/neglect since last No Patient Identification Verified: Yes visito Secondary Verification Process Yes Has Dressing in Place as Prescribed: Yes Completed: Has Compression in Place as Prescribed: Yes Patient Requires Transmission- No Pain Present Now: No Based Precautions: Patient Has Alerts: Yes Patient Alerts: Patient on Blood Thinner DM II ABI L 1.06; R 1.13 Electronic Signature(s) Signed: 03/22/2015 8:13:12 AM By: Maurice Sanders BSN, RN Entered By: Maurice Sanders on 03/22/2015 08:13:12 Maurice Sanders ([id number]) -------------------------------------------------------------------------------- Encounter Discharge Information Details Patient Name: Maurice Sanders Date of Service: 03/22/2015 8:00 AM Medical Record Number: [id number] Patient Account Number: [id number] Date of Birth/Sex: 14-Jul-1946 (69 y.o. Male) Treating RN: Maurice Sanders, Rita Primary Care Physician: Maurice Sanders Other Clinician: Referring Physician: Beverely Sanders Treating Physician/Extender: Maurice Sanders in Treatment: 10 Encounter Discharge Information Items Discharge Pain Level: 0 Discharge Condition: Stable Ambulatory Status: Ambulatory Discharge  Destination: Home Transportation: Private Auto Accompanied By: self Schedule Follow-up Appointment: No Medication Reconciliation completed No and provided to Patient/Care Maurice Sanders: Patient Clinical Summary of Care: Declined Electronic Signature(s) Signed: 03/22/2015 8:59:09 AM By: Maurice Sanders Previous Signature: 03/22/2015 8:52:41 AM Version By: Maurice Sanders BSN, RN Entered By: Maurice Sanders on 03/22/2015 08:59:09 Maurice Sanders ([id number]) -------------------------------------------------------------------------------- Lower Extremity Assessment Details Patient Name: Maurice Sanders Date of Service: 03/22/2015 8:00 AM Medical Record Number: [id number] Patient Account Number: [id number] Date of Birth/Sex: [birth year] (69 y.o. Male) Treating RN: Maurice Sanders, Rita Primary Care Physician: Maurice Sanders Other Clinician: Referring Physician: Beverely Sanders Treating Physician/Extender: Maurice Sanders in Treatment: 36 Edema Assessment Assessed: [Left: No] [Right: No] E[Left: dema] [Right: :] Calf Left: Right: Point of Measurement: 33 cm From Medial Instep cm 37.6 cm Ankle Left: Right: Point of Measurement: 11 cm From Medial Instep cm 23.5 cm Vascular Assessment Claudication: Claudication Assessment [Right:None] Pulses: Posterior Tibial Dorsalis Pedis Palpable: [Right:Yes] Extremity colors, hair growth, and conditions: Extremity Color: [Right:Hyperpigmented] Hair Growth on Extremity: [Right:No] Temperature of Extremity: [Right:Warm] Capillary Refill: [Right:< 3 seconds] Toe Nail Assessment Left: Right: Thick: Yes Discolored: Yes Deformed: No Improper Length and Hygiene: No Electronic Signature(s) Signed: 03/22/2015 8:17:11 AM By: Maurice Sanders BSN, RN Entered By: Maurice Sanders on 03/22/2015 08:17:11 Maurice Sanders ([id number]) Maurice Sanders ([id number]) -------------------------------------------------------------------------------- Multi Wound Chart Details Patient Name:  Maurice Sanders Date of Service: 03/22/2015 8:00 AM Medical Record Number: [id number] Patient Account Number: [id number] Date of Birth/Sex: 08-29-1945 (68 y.o. Male) Treating RN: Maurice Sanders, Rita Primary Care Physician: Maurice Sanders Other Clinician: Referring Physician: Beverely Sanders Treating Physician/Extender: Maurice Sanders in Treatment: 36 Vital Signs Height(in): 71 Pulse(bpm): 61 Weight(lbs): 291.7 Blood Pressure 145/61 (mmHg): Body Mass Index(BMI): 41 Temperature(F): 97.7 Respiratory Rate 18 (breaths/min): Photos: [2:No Photos] [6:No Photos] [N/A:N/A] Wound Location: [2:Right, Proximal, Medial Malleolus] [6:Right Lower Leg - Proximal] [N/A:N/A] Wounding Event: [2:Gradually Appeared] [6:Gradually Appeared] [N/A:N/A] Primary Etiology: [2:Venous Leg Ulcer] [6:Lymphedema] [N/A:N/A] Comorbid History: [2:N/A] [6:Sleep Apnea, Congestive Heart Failure,  Deep Vein Thrombosis, Hypertension, Type II Diabetes, Gout] [N/A:N/A] Date Acquired: [2:07/31/2014] [6:03/15/2015] [N/A:N/A] Weeks of Treatment: [2:33] [6:1] [N/A:N/A] Wound Status: [2:Open] [6:Open] [N/A:N/A] Measurements L x W x D 0.8x1.9x0.2 [6:0.9x0.5x0.2] [N/A:N/A] (cm) Area (cm) : [2:1.194] [6:0.353] [N/A:N/A] Volume (cm) : [2:0.239] [6:0.071] [N/A:N/A] % Reduction in Area: [2:-68.90%] [6:85.00%] [N/A:N/A] % Reduction in Volume: -236.60% [6:84.90%] [N/A:N/A] Classification: [2:Full Thickness Without Exposed Support Structures] [6:Full Thickness Without Exposed Support Structures] [N/A:N/A] HBO Classification: [2:N/A] [6:Grade 1] [N/A:N/A] Exudate Amount: [2:N/A] [6:Medium] [N/A:N/A] Exudate Type: [2:N/A] [6:Serosanguineous] [N/A:N/A] Exudate Color: [2:N/A] [6:red, brown] [N/A:N/A] Wound Margin: [2:N/A] [6:Distinct, outline attached] [N/A:N/A] Granulation Amount: [2:N/A] [6:Small (1-33%)] [N/A:N/A] Granulation Quality: [2:N/A] [6:Pink, Pale] [N/A:N/A] Necrotic Amount: [2:N/A] [6:Medium (34-66%)]  [N/A:N/A] Epithelialization: N/A None N/A Periwound Skin Texture: No Abnormalities Noted Edema: Yes N/A Excoriation: No Induration: No Callus: No Crepitus: No Fluctuance: No Friable: No Rash: No Scarring: No Periwound Skin No Abnormalities Noted Moist: Yes N/A Moisture: Maceration: No Dry/Scaly: No Periwound Skin Color: No Abnormalities Noted Atrophie Blanche: No N/A Cyanosis: No Ecchymosis: No Erythema: No Hemosiderin Staining: No Mottled: No Pallor: No Rubor: No Temperature: N/A No Abnormality N/A Tenderness on No No N/A Palpation: Wound Preparation: N/A Ulcer Cleansing: Other: N/A soap and water Topical Anesthetic Applied: Other: lidocaine 4% Treatment Notes Electronic Signature(s) Signed: 03/22/2015 8:35:06 AM By: Maurice Sanders BSN, RN Entered By: Maurice Sanders on 03/22/2015 08:35:06 Maurice Sanders ([id number]) -------------------------------------------------------------------------------- Multi-Disciplinary Care Plan Details Patient Name: Maurice Sanders Date of Service: 03/22/2015 8:00 AM Medical Record Number: [id number] Patient Account Number: [id number] Date of Birth/Sex: [birth year] (69 y.o. Male) Treating RN: Maurice Sanders,  Sink Primary Care Physician: Maurice Sanders Other Clinician: Referring Physician: Beverely Sanders Treating Physician/Extender: Maurice Sanders in Treatment: 18 Active Inactive Abuse / Safety / Falls / Self Care Management Nursing Diagnoses: Potential for falls Goals: Patient will remain injury free Date Initiated: 07/10/2014 Goal Status: Active Interventions: Assess fall risk on admission and as needed Notes: Nutrition Nursing Diagnoses: Potential for alteratiion in Nutrition/Potential for imbalanced nutrition Goals: Patient/caregiver agrees to and verbalizes understanding of need to use nutritional supplements and/or vitamins as prescribed Date Initiated: 07/10/2014 Goal Status: Active Interventions: Assess patient nutrition upon  admission and as needed per policy Notes: Orientation to the Wound Care Program Nursing Diagnoses: Knowledge deficit related to the wound healing center program Goals: Patient/caregiver will verbalize understanding of the Wound Healing Center Program [place], [place] ([id number]) Date Initiated: 07/10/2014 Goal Status: Active Interventions: Provide education on orientation to the wound center Notes: Wound/Skin Impairment Nursing Diagnoses: Impaired tissue integrity Goals: Ulcer/skin breakdown will have a volume reduction of 30% by week 4 Date Initiated: 07/10/2014 Goal Status: Active Interventions: Assess ulceration(s) every visit Notes: Electronic Signature(s) Signed: 03/22/2015 8:34:52 AM By: Maurice Sanders BSN, RN Entered By: Maurice Sanders on 03/22/2015 08:34:52 Maurice Sanders ([id number]) -------------------------------------------------------------------------------- Pain Assessment Details Patient Name: Maurice Sanders Date of Service: 03/22/2015 8:00 AM Medical Record Number: [id number] Patient Account Number: [id number] Date of Birth/Sex: [birth year] (69 y.o. Male) Treating RN: Maurice Sanders, Rita Primary Care Physician: Maurice Sanders Other Clinician: Referring Physician: Beverely Sanders Treating Physician/Extender: Maurice Sanders in Treatment: 36 Active Problems Location of Pain Severity and Description of Pain Patient Has Paino No Site Locations Pain Management and Medication Current Pain Management: Electronic Signature(s) Signed: 03/22/2015 8:13:18 AM By: Maurice Sanders BSN, RN Entered By: Maurice Sanders on 03/22/2015 08:13:18 Maurice Sanders ([id number]) -------------------------------------------------------------------------------- Patient/Caregiver Education Details Patient Name: Maurice Sanders Date of Service: 03/22/2015 8:00 AM Medical Record Number: [id number] Patient Account Number: [id number]  Date of Birth/Gender: March 30, 1946 (69 y.o. Male) Treating RN: Maurice Sanders,  [place] Sink Primary Care Physician: Maurice Sanders Other Clinician: Referring Physician: Beverely Sanders Treating Physician/Extender: Maurice Sanders in Treatment: 79 Education Assessment Education Provided To: Patient Education Topics Provided Welcome To The Wound Care Center: Methods: Explain/Verbal Responses: State content correctly Electronic Signature(s) Signed: 03/22/2015 8:52:51 AM By: Maurice Sanders BSN, RN Entered By: Maurice Sanders on 03/22/2015 08:52:51 Maurice Sanders ([id number]) -------------------------------------------------------------------------------- Wound Assessment Details Patient Name: Maurice Sanders Date of Service: 03/22/2015 8:00 AM Medical Record Number: [id number] Patient Account Number: [id number] Date of Birth/Sex: [birth year] (69 y.o. Male) Treating RN: Maurice Sanders, Rita Primary Care Physician: Maurice Sanders Other Clinician: Referring Physician: Beverely Sanders Treating Physician/Extender: Maurice Sanders in Treatment: 36 Wound Status Wound Number: 2 Primary Etiology: Venous Leg Ulcer Wound Location: Right, Proximal, Medial Wound Status: Open Malleolus Wounding Event: Gradually Appeared Date Acquired: 07/31/2014 Weeks Of Treatment: 33 Clustered Wound: No Photos Photo Uploaded By: Maurice Sanders on 03/22/2015 16:21:26 Wound Measurements Length: (cm) 0.8 Width: (cm) 1.9 Depth: (cm) 0.2 Area: (cm) 1.194 Volume: (cm) 0.239 % Reduction in Area: -68.9% % Reduction in Volume: -236.6% Wound Description Full Thickness Without Exposed Classification: Support Structures Periwound Skin Texture Texture Color No Abnormalities Noted: No No Abnormalities Noted: No Moisture No Abnormalities Noted: No Treatment Notes Mearns, Daishon ([id number]) Wound #2 (Right, Proximal, Medial Malleolus) 1. Cleansed with: Cleanse wound with antibacterial soap and water 3. Peri-wound Care: Skin Prep 4. Dressing Applied: Other dressing (specify in notes) 5. Secondary  Dressing Applied ABD Pad Dry Gauze Contact layer 7. Secured with 4-Layer Compression System - Right Lower Extremity Electronic Signature(s) Signed: 03/22/2015 4:21:36 PM By: Maurice Sanders BSN, RN Entered By: Maurice Sanders on 03/22/2015 08:65:78 Maurice Sanders ([id number]) -------------------------------------------------------------------------------- Wound Assessment Details Patient Name: Maurice Sanders Date of Service: 03/22/2015 8:00 AM Medical Record Number: [id number] Patient Account Number: [id number] Date of Birth/Sex: May 14, 1946 (69 y.o. Male) Treating RN: Afful, RN, Sanders, Rita Primary Care Physician: Maurice Sanders Other Clinician: Referring Physician: Beverely Sanders Treating Physician/Extender: Maurice Sanders in Treatment: 36 Wound Status Wound Number: 6 Primary Lymphedema Etiology: Wound Location: Right Lower Leg - Proximal Wound Open Wounding Event: Gradually Appeared Status: Date Acquired: 03/15/2015 Comorbid Sleep Apnea, Congestive Heart Failure, Weeks Of Treatment: 1 History: Deep Vein Thrombosis, Hypertension, Clustered Wound: No Type II Diabetes, Gout Photos Photo Uploaded By: Maurice Sanders on 03/22/2015 16:21:27 Wound Measurements Length: (cm) 0.9 Width: (cm) 0.5 Depth: (cm) 0.2 Area: (cm) 0.353 Volume: (cm) 0.071 % Reduction in Area: 85% % Reduction in Volume: 84.9% Epithelialization: None Tunneling: No Undermining: No Wound Description Full Thickness Without Exposed Foul Odor A Classification: Support Structures Diabetic Severity Grade 1 (Wagner): Wound Margin: Distinct, outline attached Exudate Amount: Medium Exudate Type: Serosanguineous Exudate Color: red, brown fter Cleansing: No Wound Bed Granulation Amount: Small (1-33%) Exposed Structure Cicio, Sevin ([id number]) Granulation Quality: Pink, Pale Fascia Exposed: No Necrotic Amount: Medium (34-66%) Fat Layer Exposed: No Necrotic Quality: Adherent Slough Tendon Exposed: No Muscle  Exposed: No Joint Exposed: No Bone Exposed: No Limited to Skin Breakdown Periwound Skin Texture Texture Color No Abnormalities Noted: No No Abnormalities Noted: No Callus: No Atrophie Blanche: No Crepitus: No Cyanosis: No Excoriation: No Ecchymosis: No Fluctuance: No Erythema: No Friable: No Hemosiderin Staining: No Induration: No Mottled: No Localized Edema: Yes Pallor: No Rash: No Rubor: No Scarring: No Temperature / Pain Moisture Temperature: No Abnormality No Abnormalities Noted: No Dry / Scaly: No Maceration: No Moist: Yes Wound Preparation Ulcer Cleansing: Other:  soap and water, Topical Anesthetic Applied: Other: lidocaine 4%, Treatment Notes Wound #6 (Right, Proximal Lower Leg) 1. Cleansed with: Cleanse wound with antibacterial soap and water 3. Peri-wound Care: Skin Prep 4. Dressing Applied: Other dressing (specify in notes) 5. Secondary Dressing Applied ABD Pad Dry Gauze Contact layer 7. Secured with 4-Layer Compression System - Right Lower Extremity Electronic Signature(s) Signed: 03/22/2015 8:23:40 AM By: Maurice Sanders BSN, RN [place], [place] ([id number]) Entered By: Maurice Sanders on 03/22/2015 08:23:40 Maurice Sanders ([id number]) -------------------------------------------------------------------------------- Vitals Details Patient Name: Maurice Sanders Date of Service: 03/22/2015 8:00 AM Medical Record Number: [id number] Patient Account Number: [id number] Date of Birth/Sex: 04-23-1946 (69 y.o. Male) Treating RN: Afful, RN, Sanders, Rita Primary Care Physician: Maurice Sanders Other Clinician: Referring Physician: Beverely Sanders Treating Physician/Extender: Maurice Sanders in Treatment: 36 Vital Signs Time Taken: 08:13 Temperature (F): 97.7 Height (in): 71 Pulse (bpm): 61 Weight (lbs): 291.7 Respiratory Rate (breaths/min): 18 Body Mass Index (BMI): 40.7 Blood Pressure (mmHg): 145/61 Reference Range: 80 - 120 mg / dl Electronic  Signature(s) Signed: 03/22/2015 8:16:19 AM By: Maurice Sanders BSN, RN Previous Signature: 03/22/2015 8:13:25 AM Version By: Maurice Sanders BSN, RN Entered By: Maurice Sanders on 03/22/2015 08:16:19

## 2015-03-22 NOTE — Progress Notes (Addendum)
JACOBEY, GURA ([id number]) Visit Report for 03/22/2015 Chief Complaint Document Details Patient Name: Maurice Sanders, Maurice Sanders Date of Service: 03/22/2015 8:00 AM Medical Record Number: [id number] Patient Account Number: [id number] Date of Birth/Sex: July 20, 1946 (69 y.o. Male) Treating RN: Clover Mealy, RN, BSN, [place] Sink Primary Care Physician: Beverely Risen Other Clinician: Referring Physician: Beverely Risen Treating Physician/Extender: Rudene Re in Treatment: 71 Information Obtained from: Patient Chief Complaint Maurice Sanders has been coming for dressing changes twice a week, on Monday and Thursday. Over all he is doing fine he has his blood sugar under control and is managing his diuretics well. after changing over from calamine to zinc oxide he said his own Unna's boot still gave him a bit of itching and problems and he had significant swelling which made the Unna's boot fairly tight. Electronic Signature(s) Signed: 03/22/2015 8:53:09 AM By: Evlyn Kanner MD, FACS Entered By: Evlyn Kanner on 03/22/2015 08:53:09 Maurice Sanders ([id number]) -------------------------------------------------------------------------------- Cellular or Tissue Based Product Details Patient Name: Maurice Sanders Date of Service: 03/22/2015 8:00 AM Medical Record Number: [id number] Patient Account Number: [id number] Date of Birth/Sex: August 22, 1945 (69 y.o. Male) Treating RN: Clover Mealy, RN, BSN,  Sink Primary Care Physician: Beverely Risen Other Clinician: Referring Physician: Beverely Risen Treating Physician/Extender: Rudene Re in Treatment: 71 Cellular or Tissue Based Wound #2 Right,Proximal,Medial Malleolus Product Type Applied to: Performed By: Physician Tristan Schroeder., MD Cellular or Tissue Based Apligraf Product Type: Time-Out Taken: Yes Location: trunk / arms / legs Wound Size (sq cm): 1.52 Product Size (sq cm): 44 Waste Size (sq cm): 36 Waste Reason: extra Amount of Product Applied (sq cm): 8 Lot #:  [id number] Expiration Date: 03/29/2015 Fenestrated: Yes Instrument: Blade Reconstituted: No Secured: Yes Secured With: Steri-Strips Dressing Applied: Yes Primary Dressing: Mepitel Procedural Pain: 0 Post Procedural Pain: 0 Response to Treatment: Procedure was tolerated well Electronic Signature(s) Signed: 03/22/2015 9:01:56 AM By: Evlyn Kanner MD, FACS Previous Signature: 03/22/2015 8:58:45 AM Version By: Elpidio Eric BSN, RN Entered By: Evlyn Kanner on 03/22/2015 09:01:56 Maurice Sanders ([id number]) -------------------------------------------------------------------------------- HPI Details Patient Name: Maurice Sanders Date of Service: 03/22/2015 8:00 AM Medical Record Number: [id number] Patient Account Number: [id number] Date of Birth/Sex: 07/07/1946 (69 y.o. Male) Treating RN: Clover Mealy, RN, BSN, [place] Primary Care Physician: Beverely Risen Other Clinician: Referring Physician: Beverely Risen Treating Physician/Extender: Rudene Re in Treatment: 71 History of Present Illness Location: right lower extremity Quality: Patient reports experiencing a dull pain to affected area(s). Severity: patient denies any severe pain. Duration: he has had this wound for a few weeks now. Timing: Pain in wound is Intermittent (comes and goes Context: the patient has a long history of venous insufficiency. He has been very compliant with wearing his compression stockings. Modifying Factors: the patient normally wears compression stockings. Recently he had placed a fair amount of Vaseline which cause skin irritation. Associated Signs and Symptoms: Patient denies any fevers. He has had a moderate amount of drainage. HPI Description: The patient is 69 year-old male with a history of diabetes who presents with right lower extremity wound. He first presented to our clinic on 07/10/2014. He does have a history of venous insufficiency in the past. He has been very compliant with wearing his compression  stockings. He recently Vaseline on his legs which caused skin irritation and breakdown. The patient returns for follow-up today. he denies any fevers at home. However due to the fact that he had stopped his diuretic he has had a lot of edema of the lower extremity. He also has a lot of scaly  skin on his right lower extremity. 10/02/14 -- he has resumed his diuretics on a regular basis and has been walking around adequately and has overall improved. No pain or no discharge from the wounds. 10/09/14 -- the patient has been having some pain at night while sleeping and was requesting pain medications. He also says that due to taking his diuretics he's been getting some cramps and I have asked him to please see his PCP regarding both these problems. Also awaiting insurance clearance regarding his Apligraf. addendum: I have now got a clear copy of his vascular workup studies and have reviewed the entire details dated encounter 08/17/2014. the lower extremity venous duplex study done on 08/17/2014 revealed no evidence of deep vein thrombosis or superficial thrombophlebitis bilaterally, no incompetence of the greater small saphenous veins bilaterally, enlarged lymph nodes in the groin bilaterally. As seen by Dr. Gilda Crease on the same day. Dr. Gilda Crease recommended that he should continue wearing graduated compression stockings of class I which is 20-30 mmHg pressure on a daily basis and a prescription was given. He was to be reassessed in 6 months and the question of getting him lymphedema pumps were also discussed. 10/16/2014. Vascular note reviewed in detail. I have been able to review his notes from Dr. Gilda Crease on 08/17/2014. 1.The venous duplex study done on 08/17/2014 showed that all veins visualized showed no evidence of DVT. 2. no incompetence of the greater small saphenous veins bilaterally. Maurice Sanders, Maurice Sanders ([id number]) The plan was that he had no surgery or intervention needed at this point. The  long discussion had showed that the patient had chronic skin changes accompanied by venous insufficiency and at this stage he would benefit from wearing graduated compression stockings of the 20-30 mmHg on a daily basis and review would be done in his office back again in 6 months. At that time the patient could have a lymph pump. except for the new abrasion on the right lower extremity just below his tibial tuberosity he is doing fine and his ulcers actually look much better. 10/23/14 -- Culture report on Maurice Sanders --- His wound culture has grown Citrobacter koseri and Enterococcus faecalis. Final report is back but so far sensitive to Bactrim and ampicillin and ciprofloxacin. Bactrim DS was called in for 14 days. 11/13/2014 last week we changed his Unna boot on Monday and Thursday and this has helped him a lot. The edema has been much better the Unna boot hasn't slipped down and he has not had any skin abrasions. 11/20/2014 -- he saw the vascular surgeon Dr. Gilda Crease this morning and he was pleased with his progress and has said he would be ordering him some lymphedema pumps. Other than that the patient is coming to change his Unna's boots twice a week and he is doing well with this. 11/27/2014 -- over Saturday and Sunday he started having more swelling in his leg and some pain and I believe his own Unna's boot caused him some discomfort and excoriation of skin. He does admit he took some more juices orally but he has continued his diuretic. 12/11/2014 -- he has completed his course of antibiotics and has had no fresh issues. He has had no pain no swelling and is managing well with his blood sugars and diuretics. 12/14/2014 between Monday and today he has had some dietary indiscretions and had some [place] food which may have made him retain a lot of fluid. His edema has increased markedly and a lot of more excoriation. 12/25/2014 -- This  week he has really done well and his edema is  under control and these had no pain or problems. His leg is feeling good. 01/04/2015 -- He has done great along the way and he has managed to keep his wrap for almost her entire week. No issues or problems. 02/01/2015 -- he is doing fine and easier for his first application of the Apligraf skin substitute. 02/08/2015 -- he had a lot of drainage to the dressing over the Apligraf and this will be addressed today. 02/15/2015 -- his wound looks very good and there was not much of drainage. He will have a second application of Apligraf. 02/22/2015 -- his Apligraf is in place and he has significant drainage. 02/27/2015 -- he is here for his third application of Apligraf 03/06/2015 -- he is doing well and is here for a wound check. He did have significant drainage this week too. 03/15/2015 -- he has done better with his wound and not had any reaction to the Profore 4-layer wrap. He does feel however that a small lace opened where the Steri-Strip was. He does not have an Apligraf ordered for him today. 03/21/2015 -- he is done fine and is here for his fourth application of Apligraf. Maurice Sanders, Maurice Sanders ([id number]) Electronic Signature(s) Signed: 03/22/2015 8:53:39 AM By: Evlyn Kanner MD, FACS Entered By: Evlyn Kanner on 03/22/2015 08:53:39 Maurice Sanders ([id number]) -------------------------------------------------------------------------------- Physical Exam Details Patient Name: Maurice Sanders Date of Service: 03/22/2015 8:00 AM Medical Record Number: [id number] Patient Account Number: [id number] Date of Birth/Sex: Apr 23, 1946 (69 y.o. Male) Treating RN: Clover Mealy, RN, BSN, [place] Sink Primary Care Physician: Beverely Risen Other Clinician: Referring Physician: Beverely Risen Treating Physician/Extender: Rudene Re in Treatment: 71 Constitutional . Pulse regular. Respirations normal and unlabored. Afebrile. . Eyes Nonicteric. Reactive to light. Ears, Nose, Mouth, and Throat Lips, teeth, and gums WNL.Marland Kitchen  Moist mucosa without lesions . Neck supple and nontender. No palpable supraclavicular or cervical adenopathy. Normal sized without goiter. Respiratory WNL. No retractions.. Breath sounds WNL, No rubs, rales, rhonchi, or wheeze.. Cardiovascular Heart rhythm and rate regular, no murmur or gallop.. Pedal Pulses WNL. No clubbing, cyanosis or edema. Chest Breasts symmetical and no nipple discharge.. Breast tissue WNL, no masses, lumps, or tenderness.. Lymphatic No adneopathy. No adenopathy. No adenopathy. Musculoskeletal Adexa without tenderness or enlargement.. Digits and nails w/o clubbing, cyanosis, infection, petechiae, ischemia, or inflammatory conditions.. Integumentary (Hair, Skin) No suspicious lesions. No crepitus or fluctuance. No peri-wound warmth or erythema. No masses.Marland Kitchen Psychiatric Judgement and insight Intact.. No evidence of depression, anxiety, or agitation.. Notes The main wound looks good and there is no evidence of cellulitis or surrounding blisters. Electronic Signature(s) Signed: 03/22/2015 8:54:13 AM By: Evlyn Kanner MD, FACS Entered By: Evlyn Kanner on 03/22/2015 08:54:13 Maurice Sanders ([id number]) -------------------------------------------------------------------------------- Physician Orders Details Patient Name: Maurice Sanders Date of Service: 03/22/2015 8:00 AM Medical Record Number: [id number] Patient Account Number: [id number] Date of Birth/Sex: 08-14-1945 (69 y.o. Male) Treating RN: Clover Mealy, RN, BSN, [place]ink Primary Care Physician: Beverely Risen Other Clinician: Referring Physician: Beverely Risen Treating Physician/Extender: Rudene Re in Treatment: 24 Verbal / Phone Orders: Yes Clinician: Afful, RN, BSN, [place] Read Back and Verified: Yes Diagnosis Coding Wound Cleansing Wound #2 Right,Proximal,Medial Malleolus o Clean wound with Normal Saline. Anesthetic Wound #2 Right,Proximal,Medial Malleolus o Topical Lidocaine 4% cream applied to wound bed  prior to debridement Primary Wound Dressing Wound #2 Right,Proximal,Medial Malleolus o Other: - Mepitel secured by steri-strips - apligraf applied by Dr Meyer Russel today - do not remove below meptiel next  week Secondary Dressing Wound #2 Right,Proximal,Medial Malleolus o ABD pad o Drawtex Dressing Change Frequency Wound #2 Right,Proximal,Medial Malleolus o Change dressing every week Follow-up Appointments Wound #2 Right,Proximal,Medial Malleolus o Return Appointment in 1 week. Edema Control o 4-Layer Compression System - Right Lower Extremity Advanced Therapies Wound #2 Right,Proximal,Medial Malleolus o Apligraf application in clinic; including contact layer, fixation with steri strips, dry gauze and cover dressing. Maurice Sanders, Maurice Sanders ([id number]) Electronic Signature(s) Signed: 03/22/2015 8:48:32 AM By: Elpidio Eric BSN, RN Signed: 03/22/2015 1:56:54 PM By: Evlyn Kanner MD, FACS Entered By: Elpidio Eric on 03/22/2015 08:48:31 Maurice Sanders ([id number]) -------------------------------------------------------------------------------- Problem List Details Patient Name: Maurice Sanders Date of Service: 03/22/2015 8:00 AM Medical Record Number: [id number] Patient Account Number: [id number] Date of Birth/Sex: 1945-12-09 (69 y.o. 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 Male) Treating RN: Clover Mealy, RN, BSN, [place] Primary Care Physician: Beverely Risen Other Clinician: Referring Physician: Beverely Risen Treating Physician/Extender: Rudene Re in Treatment: 71 Subjective Chief Complaint Information obtained from Patient [place] has been coming for dressing changes twice a week, on Monday and Thursday. Over all he is doing fine he has his blood sugar under control and is managing his diuretics well. after changing over from calamine to zinc oxide he said his own Unna's boot still gave him a bit of itching and problems and he had significant swelling which made the Unna's boot fairly tight. History of Present Illness (HPI) The following HPI elements were documented for the patient's wound: Location: right lower extremity Quality: Patient reports experiencing a dull pain to affected area(s). Severity: patient denies any severe pain. Duration: he has had this wound for a few weeks now. Timing: Pain in wound is Intermittent (comes and goes Context: the patient has a long history of venous insufficiency. He has been very compliant with wearing his compression stockings. Modifying Factors: the patient normally wears compression stockings. Recently he had placed a fair amount of Vaseline which cause skin irritation. Associated Signs and Symptoms: Patient denies any fevers. He has had a moderate amount of drainage. The patient is 69 year-old male with a history of diabetes who presents with right lower extremity wound. He first presented to our clinic on 07/10/2014. He does have a history of venous insufficiency in the past. He has been  very compliant with wearing his compression stockings. He recently Vaseline on his legs which caused skin irritation and breakdown. The patient returns for follow-up today. he denies any fevers at home. However due to the fact that he had stopped his diuretic he has had a lot of edema of the lower extremity. He also has a lot of scaly skin on his right lower extremity. 10/02/14 -- he has resumed his diuretics on a regular basis and has been walking around adequately and has overall improved. No pain or no  discharge from the wounds. 10/09/14 -- the patient has been having some pain at night while sleeping and was requesting pain medications. He also says that due to taking his diuretics he's been getting some cramps and I have asked him to please see his PCP regarding both these problems. Also awaiting insurance clearance regarding his Apligraf. addendum: I have now got a clear copy of his vascular workup studies and have reviewed the entire details dated encounter 08/17/2014. the lower extremity venous duplex study done on 08/17/2014 revealed no evidence of deep vein thrombosis or superficial thrombophlebitis bilaterally, no incompetence of the greater small saphenous veins bilaterally, enlarged lymph nodes in the groin bilaterally. Maurice Sanders, Maurice Sanders ([id number]) As seen by Dr. Gilda Crease on the same day. Dr. Gilda Crease recommended that he should continue wearing graduated compression stockings of class I which is 20-30 mmHg pressure on a daily basis and a prescription was given. He was to be reassessed in 6 months and the question of getting him lymphedema pumps were also discussed. 10/16/2014. Vascular note reviewed in detail. I have been able to review his notes from Dr. Gilda Crease on 08/17/2014. 1.The venous duplex study done on 08/17/2014 showed that all veins visualized showed no evidence of DVT. 2. no incompetence of the greater small saphenous veins bilaterally. The plan was that he had no surgery  or intervention needed at this point. The long discussion had showed that the patient had chronic skin changes accompanied by venous insufficiency and at this stage he would benefit from wearing graduated compression stockings of the 20-30 mmHg on a daily basis and review would be done in his office back again in 6 months. At that time the patient could have a lymph pump. except for the new abrasion on the right lower extremity just below his tibial tuberosity he is doing fine and his ulcers actually look much better. 10/23/14 -- Culture report on Maurice Sanders --- His wound culture has grown Citrobacter koseri and Enterococcus faecalis. Final report is back but so far sensitive to Bactrim and ampicillin and ciprofloxacin. Bactrim DS was called in for 14 days. 11/13/2014 last week we changed his Unna boot on Monday and Thursday and this has helped him a lot. The edema has been much better the Unna boot hasn't slipped down and he has not had any skin abrasions. 11/20/2014 -- he saw the vascular surgeon Dr. Gilda Crease this morning and he was pleased with his progress and has said he would be ordering him some lymphedema pumps. Other than that the patient is coming to change his Unna's boots twice a week and he is doing well with this. 11/27/2014 -- over Saturday and Sunday he started having more swelling in his leg and some pain and I believe his own Unna's boot caused him some discomfort and excoriation of skin. He does admit he took some more juices orally but he has continued his diuretic. 12/11/2014 -- he has completed his course of antibiotics and has had no fresh issues. He has had no pain no swelling and is managing well with his blood sugars and diuretics. 12/14/2014 between Monday and today he has had some dietary indiscretions and had some [place] food which may have made him retain a lot of fluid. His edema has increased markedly and a lot of more excoriation. 12/25/2014 -- This week he  has really done well and his edema is under control and these had no pain or problems. His leg is feeling good. 01/04/2015 -- He has done great  along the way and he has managed to keep his wrap for almost her entire week. No issues or problems. 02/01/2015 -- he is doing fine and easier for his first application of the Apligraf skin substitute. 02/08/2015 -- he had a lot of drainage to the dressing over the Apligraf and this will be addressed today. Maurice Sanders, Maurice Sanders ([id number]) 02/15/2015 -- his wound looks very good and there was not much of drainage. He will have a second application of Apligraf. 02/22/2015 -- his Apligraf is in place and he has significant drainage. 02/27/2015 -- he is here for his third application of Apligraf 03/06/2015 -- he is doing well and is here for a wound check. He did have significant drainage this week too. 03/15/2015 -- he has done better with his wound and not had any reaction to the Profore 4-layer wrap. He does feel however that a small lace opened where the Steri-Strip was. He does not have an Apligraf ordered for him today. 03/21/2015 -- he is done fine and is here for his fourth application of Apligraf. Objective Constitutional Pulse regular. Respirations normal and unlabored. Afebrile. Vitals Time Taken: 8:13 AM, Height: 71 in, Weight: 291.7 lbs, BMI: 40.7, Temperature: 97.7 F, Pulse: 61 bpm, Respiratory Rate: 18 breaths/min, Blood Pressure: 145/61 mmHg. Eyes Nonicteric. Reactive to light. Ears, Nose, Mouth, and Throat Lips, teeth, and gums WNL.Marland Kitchen Moist mucosa without lesions . Neck supple and nontender. No palpable supraclavicular or cervical adenopathy. Normal sized without goiter. Respiratory WNL. No retractions.. Breath sounds WNL, No rubs, rales, rhonchi, or wheeze.. Cardiovascular Heart rhythm and rate regular, no murmur or gallop.. Pedal Pulses WNL. No clubbing, cyanosis or edema. Chest Breasts symmetical and no nipple discharge.. Breast  tissue WNL, no masses, lumps, or tenderness.. Lymphatic No adneopathy. No adenopathy. No adenopathy. Musculoskeletal Adexa without tenderness or enlargement.. Digits and nails w/o clubbing, cyanosis, infection, petechiae, ischemia, or inflammatory conditions.Marland Kitchen Maurice Sanders, Maurice Sanders ([id number]) Psychiatric Judgement and insight Intact.. No evidence of depression, anxiety, or agitation.. General Notes: The main wound looks good and there is no evidence of cellulitis or surrounding blisters. Integumentary (Hair, Skin) No suspicious lesions. No crepitus or fluctuance. No peri-wound warmth or erythema. No masses.. Wound #2 status is Open. Original cause of wound was Gradually Appeared. The wound is located on the Right,Proximal,Medial Malleolus. The wound measures 0.8cm length x 1.9cm width x 0.2cm depth; 1.194cm^2 area and 0.239cm^3 volume. Wound #6 status is Open. Original cause of wound was Gradually Appeared. The wound is located on the Right,Proximal Lower Leg. The wound measures 0.9cm length x 0.5cm width x 0.2cm depth; 0.353cm^2 area and 0.071cm^3 volume. The wound is limited to skin breakdown. There is no tunneling or undermining noted. There is a medium amount of serosanguineous drainage noted. The wound margin is distinct with the outline attached to the wound base. There is small (1-33%) pink, pale granulation within the wound bed. There is a medium (34-66%) amount of necrotic tissue within the wound bed including Adherent Slough. The periwound skin appearance exhibited: Localized Edema, Moist. The periwound skin appearance did not exhibit: Callus, Crepitus, Excoriation, Fluctuance, Friable, Induration, Rash, Scarring, Dry/Scaly, Maceration, Atrophie Blanche, Cyanosis, Ecchymosis, Hemosiderin Staining, Mottled, Pallor, Rubor, Erythema. Periwound temperature was noted as No Abnormality. 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 He will have a Profore 4-layer applied and see as back next week. Procedures Wound #2 Wound #2 is a Venous Leg Ulcer located on the Right,Proximal,Medial Malleolus. A skin graft procedure using a bioengineered skin substitute/cellular or tissue based product was performed by Tristan Schroeder., MD. [place], Maurice Sanders ([id number]) Apligraf was applied and secured with Steri-Strips. 8 sq cm of product was utilized and 36 sq cm was wasted due to extra. Post Application, Mepitel was applied. A Time Out was conducted prior to the start of the procedure. The procedure was tolerated well with a pain level of 0 throughout and a pain level of 0 following the procedure. Plan Wound Cleansing: Wound #2 Right,Proximal,Medial Malleolus: Clean wound with Normal Saline. Anesthetic: Wound #2 Right,Proximal,Medial Malleolus: Topical Lidocaine 4% cream applied to wound bed prior to debridement Primary Wound Dressing: Wound #2 Right,Proximal,Medial Malleolus: Other: - Mepitel secured by steri-strips - apligraf applied by Dr Meyer Russel today - do not remove below meptiel next week Secondary Dressing: Wound #2 Right,Proximal,Medial Malleolus: ABD pad Drawtex Dressing Change Frequency: Wound #2 Right,Proximal,Medial Malleolus: Change dressing every week Follow-up Appointments: Wound #2 Right,Proximal,Medial Malleolus: Return Appointment in 1 week. Edema Control: 4-Layer Compression System - Right Lower Extremity Advanced Therapies: Wound #2 Right,Proximal,Medial Malleolus: Apligraf application in clinic; including contact layer, fixation with steri strips, dry gauze and cover dressing. His had the fourth application of Apligraf and the usual precautions were followed. He will have a  Profore 4-layer applied and see as back next week. Electronic Signature(s) Signed: 03/22/2015 4:27:51 PM By: Evlyn Kanner MD, FACS Previous Signature: 03/22/2015 4:27:39 PM Version By: Evlyn Kanner MD, FACS [place], Maurice Sanders ([id number]) Previous Signature: 03/22/2015 8:54:54 AM Version By: Evlyn Kanner MD, FACS Entered By: Evlyn Kanner on 03/22/2015 16:27:51 Maurice Sanders ([id number]) -------------------------------------------------------------------------------- SuperBill Details Patient Name: Maurice Sanders Date of Service: 03/22/2015 Medical Record Number: [id number] Patient Account Number: [id number] Date of Birth/Sex: Feb 11, 1946 (69 y.o. 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 ulcer of ankle Facility Procedures CPT4 Code Description: 62952841 (Facility Use Only) Apligraf 1 SQ CM Modifier: Quantity: 44 CPT4 Code Description: [id number] [id number] - SKIN SUB GRAFT TRNK/ARM/LEG ICD-10 Description Diagnosis 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 I83.013 Varicose veins of right lower  extremity with ulcer o Modifier: of right lowe f ankle Quantity: 1 r extremity Physician Procedures CPT4 Code Description: [id number] 15271 - WC PHYS SKIN SUB GRAFT TRNK/ARM/LEG ICD-10 Description Diagnosis I87.311 Chronic venous hypertension (idiopathic) with ulcer o 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

## 2015-03-27 ENCOUNTER — Encounter: Payer: Medicare Other | Admitting: Surgery

## 2015-03-27 DIAGNOSIS — E11620 Type 2 diabetes mellitus with diabetic dermatitis: Secondary | ICD-10-CM | POA: Diagnosis not present

## 2015-03-27 DIAGNOSIS — I87311 Chronic venous hypertension (idiopathic) with ulcer of right lower extremity: Secondary | ICD-10-CM | POA: Diagnosis not present

## 2015-03-27 DIAGNOSIS — I83013 Varicose veins of right lower extremity with ulcer of ankle: Secondary | ICD-10-CM | POA: Diagnosis not present

## 2015-03-27 DIAGNOSIS — R600 Localized edema: Secondary | ICD-10-CM | POA: Diagnosis not present

## 2015-03-27 DIAGNOSIS — Z4800 Encounter for change or removal of nonsurgical wound dressing: Secondary | ICD-10-CM | POA: Diagnosis not present

## 2015-03-27 DIAGNOSIS — L97311 Non-pressure chronic ulcer of right ankle limited to breakdown of skin: Secondary | ICD-10-CM | POA: Diagnosis not present

## 2015-03-27 NOTE — Progress Notes (Addendum)
RITESH, OPARA ([id number]) Visit Report for 03/27/2015 Chief Complaint Document Details Patient Name: Maurice Sanders, Maurice Sanders Date of Service: 03/27/2015 8:00 AM Medical Record Number: [id number] Patient Account Number: [id number] Date of Birth/Sex: [birth year] (69 y.o. Male) Treating RN: Curtis Sites Primary Care Physician: Beverely Risen Other Clinician: Referring Physician: Beverely Risen Treating Physician/Extender: Rudene Re in Treatment: 37 Information Obtained from: Patient Chief Complaint [name] has been coming for dressing changes twice a week, on Monday and Thursday. Over all he is doing fine he has his blood sugar under control and is managing his diuretics well. after changing over from calamine to zinc oxide he said his own Unna's boot still gave him a bit of itching and problems and he had significant swelling which made the Unna's boot fairly tight. Electronic Signature(s) Signed: 03/27/2015 8:34:49 AM By: Evlyn Kanner MD, FACS Entered By: Evlyn Kanner on 03/27/2015 08:34:49 Maurice Sanders ([id number]) -------------------------------------------------------------------------------- HPI Details Patient Name: Maurice Sanders Date of Service: 03/27/2015 8:00 AM Medical Record Number: [id number] Patient Account Number: [id number] Date of Birth/Sex: 09/04/1945 (69 y.o. Male) Treating RN: Curtis Sites Primary Care Physician: Beverely Risen Other Clinician: Referring Physician: Beverely Risen Treating Physician/Extender: Rudene Re in Treatment: 37 History of Present Illness Location: right lower extremity Quality: Patient reports experiencing a dull pain to affected area(s). Severity: patient denies any severe pain. Duration: he has had this wound for a few weeks now. Timing: Pain in wound is Intermittent (comes and goes Context: the patient has a long history of venous insufficiency. He has been very compliant with wearing his compression stockings. Modifying Factors: the  patient normally wears compression stockings. Recently he had placed a fair amount of Vaseline which cause skin irritation. Associated Signs and Symptoms: Patient denies any fevers. He has had a moderate amount of drainage. HPI Description: The patient is 69 year-old male with a history of diabetes who presents with right lower extremity wound. He first presented to our clinic on 07/10/2014. He does have a history of venous insufficiency in the past. He has been very compliant with wearing his compression stockings. He recently Vaseline on his legs which caused skin irritation and breakdown. The patient returns for follow-up today. he denies any fevers at home. However due to the fact that he had stopped his diuretic he has had a lot of edema of the lower extremity. He also has a lot of scaly skin on his right lower extremity. 10/02/14 -- he has resumed his diuretics on a regular basis and has been walking around adequately and has overall improved. No pain or no discharge from the wounds. 10/09/14 -- the patient has been having some pain at night while sleeping and was requesting pain medications. He also says that due to taking his diuretics he's been getting some cramps and I have asked him to please see his PCP regarding both these problems. Also awaiting insurance clearance regarding his Apligraf. 10/16/2014. Vascular note reviewed in detail. I have been able to review his notes from Dr. Gilda Crease on 08/17/2014. 1.The venous duplex study done on 08/17/2014 showed that all veins visualized showed no evidence of DVT. 2. no incompetence of the greater small saphenous veins bilaterally. The plan was that he had no surgery or intervention needed at this point. The long discussion had showed that the patient had chronic skin changes accompanied by venous insufficiency and at this stage he would benefit from wearing graduated compression stockings of the 20-30 mmHg on a daily basis and review would  be done in  his office back again in 6 months. At that time the patient could have a lymph pump. 10/23/14 -- Culture report --- His wound culture has grown Citrobacter koseri and Enterococcus faecalis. Final report is back but so far sensitive to Bactrim and ampicillin and ciprofloxacin. Bactrim DS was called in for 14 days. DISHAWN, BHARGAVA ([id number]) 11/13/2014 last week we changed his Unna boot on Monday and Thursday and this has helped him a lot. The edema has been much better the Unna boot hasn't slipped down and he has not had any skin abrasions. 11/20/2014 -- he saw the vascular surgeon Dr. Gilda Crease this morning and he was pleased with his progress and has said he would be ordering him some lymphedema pumps. Other than that the patient is coming to change his Unna's boots twice a week and he is doing well with this. 11/27/2014 -- over Saturday and Sunday he started having more swelling in his leg and some pain and I believe his own Unna's boot caused him some discomfort and excoriation of skin. He does admit he took some more juices orally but he has continued his diuretic. 12/11/2014 -- he has completed his course of antibiotics and has had no fresh issues. He has had no pain no swelling and is managing well with his blood sugars and diuretics. 12/14/2014 between Monday and today he has had some dietary indiscretions and had some [place] food which may have made him retain a lot of fluid. His edema has increased markedly and a lot of more excoriation. 12/25/2014 -- This week he has really done well and his edema is under control and these had no pain or problems. His leg is feeling good. 01/04/2015 -- He has done great along the way and he has managed to keep his wrap for almost her entire week. No issues or problems. 02/01/2015 -- he is doing fine and easier for his first application of the Apligraf skin substitute. 02/08/2015 -- he had a lot of drainage to the dressing over the Apligraf  and this will be addressed today. 02/15/2015 -- his wound looks very good and there was not much of drainage. He will have a second application of Apligraf. 02/22/2015 -- his Apligraf is in place and he has significant drainage. 02/27/2015 -- he is here for his third application of Apligraf 03/06/2015 -- he is doing well and is here for a wound check. He did have significant drainage this week too. 03/15/2015 -- he has done better with his wound and not had any reaction to the Profore 4-layer wrap. He does feel however that a small lace opened where the Steri-Strip was. He does not have an Apligraf ordered for him today. 03/21/2015 -- he is done fine and is here for his fourth application of Apligraf. 03/27/2015 -- he is here for a wound check and other than that has been tolerating his Profore 4-layer wrap. Electronic Signature(s) Signed: 03/27/2015 8:37:03 AM By: Evlyn Kanner MD, FACS Entered By: Evlyn Kanner on 03/27/2015 08:37:02 Maurice Sanders ([id number]) -------------------------------------------------------------------------------- Physical Exam Details Patient Name: Maurice Sanders Date of Service: 03/27/2015 8:00 AM Medical Record Number: [id number] Patient Account Number: [id number] Date of Birth/Sex: 1945-09-18 (69 y.o. Male) Treating RN: Curtis Sites Primary Care Physician: Beverely Risen Other Clinician: Referring Physician: Beverely Risen Treating Physician/Extender: Rudene Re in Treatment: 37 Constitutional . Pulse regular. Respirations normal and unlabored. Afebrile. . Eyes Nonicteric. Reactive to light. Ears, Nose, Mouth, and Throat Lips, teeth, and gums WNL.Marland Kitchen Moist mucosa without lesions .  Neck supple and nontender. No palpable supraclavicular or cervical adenopathy. Normal sized without goiter. Respiratory WNL. No retractions.. Cardiovascular Pedal Pulses WNL. No clubbing, cyanosis or edema. Lymphatic No adneopathy. No adenopathy. No  adenopathy. Musculoskeletal Adexa without tenderness or enlargement.. Digits and nails w/o clubbing, cyanosis, infection, petechiae, ischemia, or inflammatory conditions.. Integumentary (Hair, Skin) No suspicious lesions. No crepitus or fluctuance. No peri-wound warmth or erythema. No masses.Marland Kitchen Psychiatric Judgement and insight Intact.. No evidence of depression, anxiety, or agitation.. Notes The Apligraf dressing is intact and not very soaked with secretions. The edema looks remarkably good and he is got minimal abrasions towards the superior part of his wrap. Electronic Signature(s) Signed: 03/27/2015 8:37:54 AM By: Evlyn Kanner MD, FACS Entered By: Evlyn Kanner on 03/27/2015 08:37:54 Maurice Sanders ([id number]) -------------------------------------------------------------------------------- Physician Orders Details Patient Name: Maurice Sanders Date of Service: 03/27/2015 8:00 AM Medical Record Number: [id number] Patient Account Number: [id number] Date of Birth/Sex: 1946-04-25 (68 y.o. Male) Treating RN: Curtis Sites Primary Care Physician: Beverely Risen Other Clinician: Referring Physician: Beverely Risen Treating Physician/Extender: Rudene Re in Treatment: 73 Verbal / Phone Orders: Yes Clinician: Curtis Sites Read Back and Verified: Yes Diagnosis Coding Wound Cleansing Wound #2 Right,Proximal,Medial Malleolus o Clean wound with Normal Saline. Wound #6 Right,Proximal Lower Leg o Clean wound with Normal Saline. Primary Wound Dressing Wound #2 Right,Proximal,Medial Malleolus o Other: - Mepitel and steri strips left intact covering Apligraf Wound #6 Right,Proximal Lower Leg o Other: - Mepitel and steri strips left intact covering Apligraf Secondary Dressing Wound #2 Right,Proximal,Medial Malleolus o ABD pad o Drawtex Wound #6 Right,Proximal Lower Leg o ABD pad o Drawtex Dressing Change Frequency Wound #2 Right,Proximal,Medial Malleolus o Change  dressing every week Wound #6 Right,Proximal Lower Leg o Change dressing every week Follow-up Appointments Wound #2 Right,Proximal,Medial Malleolus o Return Appointment in 1 week. Wound #6 Right,Proximal Lower Leg o Return Appointment in 1 week. LUCIO, LITSEY ([id number]) Edema Control Wound #2 Right,Proximal,Medial Malleolus o 4-Layer Compression System - Right Lower Extremity Wound #6 Right,Proximal Lower Leg o 4-Layer Compression System - Right Lower Extremity Electronic Signature(s) Signed: 03/27/2015 4:00:25 PM By: Evlyn Kanner MD, FACS Signed: 03/27/2015 5:20:03 PM By: Curtis Sites Entered By: Curtis Sites on 03/27/2015 08:35:15 Maurice Sanders ([id number]) -------------------------------------------------------------------------------- Problem List Details Patient Name: Maurice Sanders Date of Service: 03/27/2015 8:00 AM Medical Record Number: [id number] Patient Account Number: [id number] Date of Birth/Sex: 1946-06-13 (69 y.o. 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 Male) Treating RN: Curtis Sites Primary Care Physician: Beverely Risen Other Clinician: Referring Physician: Beverely Risen Treating Physician/Extender: Rudene Re in Treatment: 37 Subjective Chief Complaint Information obtained from Patient Erion has been coming for dressing changes twice a week, on Monday and Thursday. Over all he is doing fine he has his blood sugar under control and is managing his diuretics well. after changing over  from calamine to zinc oxide he said his own Unna's boot still gave him a bit of itching and problems and he had significant swelling which made the Unna's boot fairly tight. History of Present Illness (HPI) The following HPI elements were documented for the patient's wound: Location: right lower extremity Quality: Patient reports experiencing a dull pain to affected area(s). Severity: patient denies any severe pain. Duration: he has had this wound for a few weeks now. Timing: Pain in wound is Intermittent (comes and goes Context: the patient has a long history of venous insufficiency. He has been very compliant with wearing his compression stockings. Modifying Factors: the patient normally wears compression stockings. Recently he had placed a fair amount of Vaseline which cause skin irritation. Associated Signs and Symptoms: Patient denies any fevers. He has had a moderate amount of drainage. The patient is 69 year-old male with a history of diabetes who presents with right lower extremity wound. He first presented to our clinic on 07/10/2014. He does have a history of venous insufficiency in the past. He has been very compliant with wearing his compression stockings. He recently Vaseline on his legs which caused skin irritation and breakdown. The patient returns for follow-up today. he denies any fevers at home. However due to the fact that he had stopped his diuretic he has had a lot of edema of the lower  extremity. He also has a lot of scaly skin on his right lower extremity. 10/02/14 -- he has resumed his diuretics on a regular basis and has been walking around adequately and has overall improved. No pain or no discharge from the wounds. 10/09/14 -- the patient has been having some pain at night while sleeping and was requesting pain medications. He also says that due to taking his diuretics he's been getting some cramps and I have asked him to please see his PCP regarding both these problems. Also awaiting insurance clearance regarding his Apligraf. 10/16/2014. Vascular note reviewed in detail. I have been able to review his notes from Dr. Gilda Crease on 08/17/2014. 1.The venous duplex study done on 08/17/2014 showed that all veins visualized showed no evidence of DVT. MALACAI, GRANTZ ([id number]) 2. no incompetence of the greater small saphenous veins bilaterally. The plan was that he had no surgery or intervention needed at this point. The long discussion had showed that the patient had chronic skin changes accompanied by venous insufficiency and at this stage he would benefit from wearing graduated compression stockings of the 20-30 mmHg on a daily basis and review would be done in his office back again in 6 months. At that time the patient could have a lymph pump. 10/23/14 -- Culture report --- His wound culture has grown Citrobacter koseri and Enterococcus faecalis. Final report is back but so far sensitive to Bactrim and ampicillin and ciprofloxacin. Bactrim DS was called in for 14 days. 11/13/2014 last week we changed his Unna boot on Monday and Thursday and this has helped him a lot. The edema has been much better the Unna boot hasn't slipped down and he has not had any skin abrasions. 11/20/2014 -- he saw the vascular surgeon Dr. Gilda Crease this morning and he was pleased with his progress and has said he would be ordering him some lymphedema pumps. Other than that the patient is coming  to change his Unna's boots twice a week and he is doing well with this. 11/27/2014 -- over Saturday and Sunday he started having more swelling in his leg and some pain  and I believe his own Unna's boot caused him some discomfort and excoriation of skin. He does admit he took some more juices orally but he has continued his diuretic. 12/11/2014 -- he has completed his course of antibiotics and has had no fresh issues. He has had no pain no swelling and is managing well with his blood sugars and diuretics. 12/14/2014 between Monday and today he has had some dietary indiscretions and had some [place] food which may have made him retain a lot of fluid. His edema has increased markedly and a lot of more excoriation. 12/25/2014 -- This week he has really done well and his edema is under control and these had no pain or problems. His leg is feeling good. 01/04/2015 -- He has done great along the way and he has managed to keep his wrap for almost her entire week. No issues or problems. 02/01/2015 -- he is doing fine and easier for his first application of the Apligraf skin substitute. 02/08/2015 -- he had a lot of drainage to the dressing over the Apligraf and this will be addressed today. 02/15/2015 -- his wound looks very good and there was not much of drainage. He will have a second application of Apligraf. 02/22/2015 -- his Apligraf is in place and he has significant drainage. 02/27/2015 -- he is here for his third application of Apligraf 03/06/2015 -- he is doing well and is here for a wound check. He did have significant drainage this week too. 03/15/2015 -- he has done better with his wound and not had any reaction to the Profore 4-layer wrap. He does feel however that a small lace opened where the Steri-Strip was. He does not have an Apligraf ordered for him today. 03/21/2015 -- he is done fine and is here for his fourth application of Apligraf. 03/27/2015 -- he is here for a wound check  and other than that has been tolerating his Profore 4-layer wrap. HAADI, SANTELLAN ([id number]) Objective Constitutional Pulse regular. Respirations normal and unlabored. Afebrile. Vitals Time Taken: 8:17 AM, Height: 71 in, Weight: 291.7 lbs, BMI: 40.7, Temperature: 97.9 F, Pulse: 61 bpm, Respiratory Rate: 18 breaths/min, Blood Pressure: 133/57 mmHg. Eyes Nonicteric. Reactive to light. Ears, Nose, Mouth, and Throat Lips, teeth, and gums WNL.Marland Kitchen Moist mucosa without lesions . Neck supple and nontender. No palpable supraclavicular or cervical adenopathy. Normal sized without goiter. Respiratory WNL. No retractions.. Cardiovascular Pedal Pulses WNL. No clubbing, cyanosis or edema. Lymphatic No adneopathy. No adenopathy. No adenopathy. Musculoskeletal Adexa without tenderness or enlargement.. Digits and nails w/o clubbing, cyanosis, infection, petechiae, ischemia, or inflammatory conditions.Marland Kitchen Psychiatric Judgement and insight Intact.. No evidence of depression, anxiety, or agitation.. General Notes: The Apligraf dressing is intact and not very soaked with secretions. The edema looks remarkably good and he is got minimal abrasions towards the superior part of his wrap. Integumentary (Hair, Skin) No suspicious lesions. No crepitus or fluctuance. No peri-wound warmth or erythema. 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 he is going to be out of town for a few days and I have encouraged him to elevate his limbs as much as possible and be careful not to get his Profore damaged.  Plan Wound Cleansing: Wound #2  Right,Proximal,Medial Malleolus: Clean wound with Normal Saline. Wound #6 Right,Proximal Lower Leg: Clean wound with Normal Saline. Primary Wound Dressing: Wound #2 Right,Proximal,Medial Malleolus: Other: - Mepitel and steri strips left intact covering Apligraf Wound #6 Right,Proximal Lower Leg: Other: - Mepitel and steri strips left intact covering Apligraf Secondary Dressing: Wound #2 Right,Proximal,Medial Malleolus: ABD pad Drawtex Wound #6 Right,Proximal Lower Leg: ABD pad Drawtex Dressing Change Frequency: Wound #2 Right,Proximal,Medial Malleolus: Change dressing every week Wound #6 Right,Proximal Lower Leg: Change dressing every week Follow-up Appointments: Wound #2 Right,Proximal,Medial Malleolus: Return Appointment in 1 week. Wound #6 Right,Proximal Lower Leg: Return Appointment in 1 week. Edema Control: Wound #2 Right,Proximal,Medial Malleolus: CAMRY, THEISS ([id number]) 4-Layer Compression System - Right Lower Extremity Wound #6 Right,Proximal Lower Leg: 4-Layer Compression System - Right Lower Extremity He will have a reapplication office for layer of Profore wrap and be ready for his last application of Apligraf next week. he is going to be out of town for a few days and I have encouraged him to elevate his limbs as much as possible and be careful not to get his Profore damaged. Electronic Signature(s) Signed: 03/27/2015 8:39:02 AM By: Evlyn Kanner MD, FACS Entered By: Evlyn Kanner on 03/27/2015 08:39:02 Maurice Sanders ([id number]) -------------------------------------------------------------------------------- SuperBill Details Patient Name: Maurice Sanders Date of Service: 03/27/2015 Medical Record Number: [id number] Patient Account Number: [id number] Date of Birth/Sex: [birth year] (69 y.o. 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 ulcer of ankle Facility Procedures CPT4: Description Modifier Quantity Code [id number] (Facility Use Only) [phone] - APPLY MULTLAY COMPRS LWR RT 1 LEG Physician Procedures CPT4 Code Description: [id number] - WC PHYS LEVEL 3 - EST PT ICD-10 Description Diagnosis 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

## 2015-03-28 NOTE — Progress Notes (Signed)
COLON, RUETH ([id number]) Visit Report for 03/27/2015 Arrival Information Details Patient Name: Maurice Sanders, Maurice Sanders Date of Service: 03/27/2015 8:00 AM Medical Record Number: [id number] Patient Account Number: [id number] Date of Birth/Sex: 1946/03/13 (69 y.o. Male) Treating RN: Curtis Sites Primary Care Physician: Beverely Risen Other Clinician: Referring Physician: Beverely Risen Treating Physician/Extender: Rudene Re in Treatment: 37 Visit Information History Since Last Visit Added or deleted any medications: No Patient Arrived: Ambulatory Any new allergies or adverse reactions: No Arrival Time: 08:17 Had a fall or experienced change in No Accompanied By: self activities of daily living that may affect Transfer Assistance: None risk of falls: Patient Identification Verified: Yes Signs or symptoms of abuse/neglect since last No Secondary Verification Process Yes visito Completed: Hospitalized since last visit: No Patient Requires Transmission- No Pain Present Now: No Based Precautions: Patient Has Alerts: Yes Patient Alerts: Patient on Blood Thinner DM II ABI L 1.06; R 1.13 Electronic Signature(s) Signed: 03/27/2015 5:20:03 PM By: Curtis Sites Entered By: Curtis Sites on 03/27/2015 08:17:23 Maurice Sanders ([id number]) -------------------------------------------------------------------------------- Encounter Discharge Information Details Patient Name: Maurice Sanders Date of Service: 03/27/2015 8:00 AM Medical Record Number: [id number] Patient Account Number: [id number] Date of Birth/Sex: 1945-12-31 (69 y.o. Male) Treating RN: Curtis Sites Primary Care Physician: Beverely Risen Other Clinician: Referring Physician: Beverely Risen Treating Physician/Extender: Rudene Re in Treatment: 37 Encounter Discharge Information Items Discharge Pain Level: 0 Discharge Condition: Stable Ambulatory Status: Ambulatory Discharge Destination: Home Transportation: Private  Auto Accompanied By: self Schedule Follow-up Appointment: Yes Medication Reconciliation completed No and provided to Patient/Care Trinnity Breunig: Patient Clinical Summary of Care: Declined Electronic Signature(s) Signed: 03/27/2015 9:41:51 AM By: Gwenlyn Perking Entered By: Gwenlyn Perking on 03/27/2015 09:41:51 Maurice Sanders ([id number]) -------------------------------------------------------------------------------- Lower Extremity Assessment Details Patient Name: Maurice Sanders Date of Service: 03/27/2015 8:00 AM Medical Record Number: [id number] Patient Account Number: [id number] Date of Birth/Sex: [birth year] (69 y.o. Male) Treating RN: Curtis Sites Primary Care Physician: Beverely Risen Other Clinician: Referring Physician: Beverely Risen Treating Physician/Extender: Rudene Re in Treatment: 37 Edema Assessment Assessed: [Left: No] [Right: No] Edema: [Left: Ye] [Right: s] Calf Left: Right: Point of Measurement: 33 cm From Medial Instep cm 34.3 cm Ankle Left: Right: Point of Measurement: 11 cm From Medial Instep cm 24.2 cm Vascular Assessment Claudication: Claudication Assessment [Right:None] Pulses: Posterior Tibial Dorsalis Pedis Palpable: [Right:Yes] Extremity colors, hair growth, and conditions: Extremity Color: [Right:Hyperpigmented] Hair Growth on Extremity: [Right:No] Temperature of Extremity: [Right:Warm] Capillary Refill: [Right:< 3 seconds] Toe Nail Assessment Left: Right: Thick: Yes Discolored: Yes Deformed: Yes Improper Length and Hygiene: No Electronic Signature(s) Signed: 03/27/2015 5:20:03 PM By: Curtis Sites Entered By: Curtis Sites on 03/27/2015 08:21:27 Maurice Sanders ([id number]) [name], [name] ([id number]) -------------------------------------------------------------------------------- Multi Wound Chart Details Patient Name: Maurice Sanders Date of Service: 03/27/2015 8:00 AM Medical Record Number: [id number] Patient Account Number:  [id number] Date of Birth/Sex: Aug 23, 1945 (69 y.o. Male) Treating RN: Curtis Sites Primary Care Physician: Beverely Risen Other Clinician: Referring Physician: Beverely Risen Treating Physician/Extender: Rudene Re in Treatment: 37 Vital Signs Height(in): 71 Pulse(bpm): 61 Weight(lbs): 291.7 Blood Pressure 133/57 (mmHg): Body Mass Index(BMI): 41 Temperature(F): 97.9 Respiratory Rate 18 (breaths/min): Wound Assessments Treatment Notes Electronic Signature(s) Signed: 03/27/2015 5:20:03 PM By: Curtis Sites Entered By: Curtis Sites on 03/27/2015 27:06:23 Maurice Sanders ([id number]) -------------------------------------------------------------------------------- Multi-Disciplinary Care Plan Details Patient Name: Maurice Sanders Date of Service: 03/27/2015 8:00 AM Medical Record Number: [id number] Patient Account Number: [id number] Date of Birth/Sex: [birth year] (69 y.o. Male) Treating RN: Curtis Sites Primary Care Physician: Beverely Risen Other Clinician: Referring Physician:  Beverely Risen Treating Physician/Extender: Rudene Re in Treatment: 66 Active Inactive Abuse / Safety / Falls / Self Care Management Nursing Diagnoses: Potential for falls Goals: Patient will remain injury free Date Initiated: 07/10/2014 Goal Status: Active Interventions: Assess fall risk on admission and as needed Notes: Nutrition Nursing Diagnoses: Potential for alteratiion in Nutrition/Potential for imbalanced nutrition Goals: Patient/caregiver agrees to and verbalizes understanding of need to use nutritional supplements and/or vitamins as prescribed Date Initiated: 07/10/2014 Goal Status: Active Interventions: Assess patient nutrition upon admission and as needed per policy Notes: Orientation to the Wound Care Program Nursing Diagnoses: Knowledge deficit related to the wound healing center program Goals: Patient/caregiver will verbalize understanding of the Wound Healing Center  Program [place], [place] ([id number]) Date Initiated: 07/10/2014 Goal Status: Active Interventions: Provide education on orientation to the wound center Notes: Wound/Skin Impairment Nursing Diagnoses: Impaired tissue integrity Goals: Ulcer/skin breakdown will have a volume reduction of 30% by week 4 Date Initiated: 07/10/2014 Goal Status: Active Interventions: Assess ulceration(s) every visit Notes: Electronic Signature(s) Signed: 03/27/2015 5:20:03 PM By: Curtis Sites Entered By: Curtis Sites on 03/27/2015 08:22:22 Maurice Sanders ([id number]) -------------------------------------------------------------------------------- Patient/Caregiver Education Details Patient Name: Maurice Sanders Date of Service: 03/27/2015 8:00 AM Medical Record Number: [id number] Patient Account Number: [id number] Date of Birth/Gender: [birth year] (69 y.o. Male) Treating RN: Curtis Sites Primary Care Physician: Beverely Risen Other Clinician: Referring Physician: Beverely Risen Treating Physician/Extender: Rudene Re in Treatment: 64 Education Assessment Education Provided To: Patient Education Topics Provided Venous: Handouts: Other: wrap precautions Methods: Explain/Verbal Responses: State content correctly Electronic Signature(s) Signed: 03/27/2015 5:20:03 PM By: Curtis Sites Entered By: Curtis Sites on 03/27/2015 08:28:36 Maurice Sanders ([id number]) -------------------------------------------------------------------------------- Vitals Details Patient Name: Maurice Sanders Date of Service: 03/27/2015 8:00 AM Medical Record Number: [id number] Patient Account Number: [id number] Date of Birth/Sex: Mar 27, 1946 (69 y.o. Male) Treating RN: Curtis Sites Primary Care Physician: Beverely Risen Other Clinician: Referring Physician: Beverely Risen Treating Physician/Extender: Rudene Re in Treatment: 37 Vital Signs Time Taken: 08:17 Temperature (F): 97.9 Height (in): 71 Pulse (bpm):  61 Weight (lbs): 291.7 Respiratory Rate (breaths/min): 18 Body Mass Index (BMI): 40.7 Blood Pressure (mmHg): 133/57 Reference Range: 80 - 120 mg / dl Electronic Signature(s) Signed: 03/27/2015 5:20:03 PM By: Curtis Sites Entered By: Curtis Sites on 03/27/2015 08:19:53

## 2015-04-02 DIAGNOSIS — G4733 Obstructive sleep apnea (adult) (pediatric): Secondary | ICD-10-CM | POA: Diagnosis not present

## 2015-04-05 ENCOUNTER — Encounter: Payer: Medicare Other | Attending: Surgery | Admitting: Surgery

## 2015-04-05 DIAGNOSIS — I87311 Chronic venous hypertension (idiopathic) with ulcer of right lower extremity: Secondary | ICD-10-CM | POA: Diagnosis not present

## 2015-04-05 DIAGNOSIS — I83013 Varicose veins of right lower extremity with ulcer of ankle: Secondary | ICD-10-CM | POA: Diagnosis not present

## 2015-04-05 DIAGNOSIS — E11620 Type 2 diabetes mellitus with diabetic dermatitis: Secondary | ICD-10-CM | POA: Insufficient documentation

## 2015-04-05 DIAGNOSIS — R600 Localized edema: Secondary | ICD-10-CM | POA: Diagnosis not present

## 2015-04-05 DIAGNOSIS — L97311 Non-pressure chronic ulcer of right ankle limited to breakdown of skin: Secondary | ICD-10-CM | POA: Diagnosis not present

## 2015-04-05 NOTE — Progress Notes (Signed)
DERWIN, REDDY ([id number]) Visit Report for 04/05/2015 Arrival Information Details Patient Name: Maurice Sanders, Maurice Maurice Sanders Date of Service: 04/05/2015 8:45 AM Medical Record Number: [id number] Patient Account Number: [id number] Date of Birth/Sex: [birth year] (69 y.o. Male) Treating RN: Huel Coventry Primary Care Physician: Beverely Risen Other Clinician: Referring Physician: Beverely Risen Treating Physician/Extender: Rudene Re in Treatment: 46 Visit Information History Since Last Visit Added or deleted any medications: No Patient Arrived: Ambulatory Any new allergies or adverse reactions: No Arrival Time: 08:52 Had a fall or experienced change in No Accompanied By: self activities of daily living that may affect Transfer Assistance: None risk of falls: Patient Identification Verified: Yes Signs or symptoms of abuse/neglect since last No Secondary Verification Process Yes visito Completed: Hospitalized since last visit: No Patient Requires Transmission- No Has Dressing in Place as Prescribed: Yes Based Precautions: Has Compression in Place as Prescribed: Yes Patient Has Alerts: Yes Pain Present Now: No Patient Alerts: Patient on Blood Thinner DM II ABI L 1.06; R 1.13 Electronic Signature(s) Signed: 04/05/2015 1:30:14 PM By: Elliot Gurney, RN, BSN, Kim RN, BSN Entered By: Elliot Gurney, RN, BSN, Kim on 04/05/2015 08:53:03 Maurice Maurice Sanders ([id number]) -------------------------------------------------------------------------------- Encounter Discharge Information Details Patient Name: Maurice Maurice Sanders Date of Service: 04/05/2015 8:45 AM Medical Record Number: [id number] Patient Account Number: [id number] Date of Birth/Sex: 10/19/1945 (69 y.o. Male) Treating RN: Huel Coventry Primary Care Physician: Beverely Risen Other Clinician: Referring Physician: Beverely Risen Treating Physician/Extender: Rudene Re in Treatment: 76 Encounter Discharge Information Items Discharge Pain Level: 0 Discharge Condition:  Stable Ambulatory Status: Ambulatory Discharge Destination: Home Transportation: Private Auto Accompanied By: self Schedule Follow-up Appointment: Yes Medication Reconciliation completed Yes and provided to Patient/Care Rebbeca Sheperd: Patient Clinical Summary of Care: Declined Electronic Signature(s) Signed: 04/05/2015 11:05:29 AM By: Gwenlyn Perking Entered By: Gwenlyn Perking on 04/05/2015 11:05:29 Maurice Maurice Sanders ([id number]) -------------------------------------------------------------------------------- Lower Extremity Assessment Details Patient Name: Maurice Maurice Sanders Date of Service: 04/05/2015 8:45 AM Medical Record Number: [id number] Patient Account Number: [id number] Date of Birth/Sex: 11/20/1945 (69 y.o. Male) Treating RN: Huel Coventry Primary Care Physician: Beverely Risen Other Clinician: Referring Physician: Beverely Risen Treating Physician/Extender: Rudene Re in Treatment: 38 Edema Assessment Assessed: [Left: No] [Right: No] E[Left: dema] [Right: :] Calf Left: Right: Point of Measurement: 33 cm From Medial Instep cm 36.5 cm Ankle Left: Right: Point of Measurement: 11 cm From Medial Instep cm 29.1 cm Vascular Assessment Pulses: Posterior Tibial Dorsalis Pedis Palpable: [Right:Yes] Extremity colors, hair growth, and conditions: Extremity Color: [Right:Hyperpigmented] Hair Growth on Extremity: [Right:No] Temperature of Extremity: [Right:Warm] Capillary Refill: [Right:< 3 seconds] Toe Nail Assessment Left: Right: Thick: Yes Discolored: Yes Deformed: No Improper Length and Hygiene: No Electronic Signature(s) Signed: 04/05/2015 1:30:14 PM By: Elliot Gurney, RN, BSN, Kim RN, BSN Entered By: Elliot Gurney, RN, BSN, Kim on 04/05/2015 08:58:58 Maurice Maurice Sanders ([id number]) -------------------------------------------------------------------------------- Multi Wound Chart Details Patient Name: Maurice Maurice Sanders Date of Service: 04/05/2015 8:45 AM Medical Record Number: [id number] Patient Account  Number: [id number] Date of Birth/Sex: 09-Oct-1945 (69 y.o. Male) Treating RN: Huel Coventry Primary Care Physician: Beverely Risen Other Clinician: Referring Physician: Beverely Risen Treating Physician/Extender: Rudene Re in Treatment: 38 Vital Signs Height(in): 71 Pulse(bpm): 69 Weight(lbs): 291.7 Blood Pressure 163/62 (mmHg): Body Mass Index(BMI): 41 Temperature(F): 98.2 Respiratory Rate 18 (breaths/min): Photos: [2:No Photos] [6:No Photos] [N/A:N/A] Wound Location: [2:Right Malleolus - Medial, Proximal] [6:Right Lower Leg - Proximal] [N/A:N/A] Wounding Event: [2:Gradually Appeared] [6:Gradually Appeared] [N/A:N/A] Primary Etiology: [2:Venous Leg Ulcer] [6:Lymphedema] [N/A:N/A] Comorbid History: [2:Sleep Apnea, Congestive Heart Failure, Deep Vein Thrombosis, Hypertension, Type II Diabetes, Gout] [  6:Sleep Apnea, Congestive Heart Failure, Deep Vein Thrombosis, Hypertension, Type II Diabetes, Gout] [N/A:N/A] Date Acquired: [2:07/31/2014] [6:03/15/2015] [N/A:N/A] Weeks of Treatment: [2:35] [6:3] [N/A:N/A] Wound Status: [2:Open] [6:Open] [N/A:N/A] Measurements L x W x D 1x2x0.1 [6:0x0x0] [N/A:N/A] (cm) Area (cm) : [2:1.571] [6:0] [N/A:N/A] Volume (cm) : [2:0.157] [6:0] [N/A:N/A] % Reduction in Area: [2:-122.20%] [6:100.00%] [N/A:N/A] % Reduction in Volume: -121.10% [6:100.00%] [N/A:N/A] Classification: [2:Full Thickness Without Exposed Support Structures] [6:Full Thickness Without Exposed Support Structures] [N/A:N/A] HBO Classification: [2:Grade 1] [6:Grade 1] [N/A:N/A] Exudate Amount: [2:Large] [6:Medium] [N/A:N/A] Exudate Type: [2:Serous] [6:Serosanguineous] [N/A:N/A] Exudate Color: [2:amber] [6:red, brown] [N/A:N/A] Wound Margin: [2:Indistinct, nonvisible] [6:Distinct, outline attached] [N/A:N/A] Granulation Amount: [2:Small (1-33%)] [6:Large (67-100%)] [N/A:N/A] Granulation Quality: [2:Red] [6:Pink, Pale] [N/A:N/A] Necrotic Amount: [2:Medium (34-66%)] [6:None Present  (0%)] [N/A:N/A] Exposed Structures: Fascia: No Fascia: No N/A Fat: No Fat: No Tendon: No Tendon: No Muscle: No Muscle: No Joint: No Joint: No Bone: No Bone: No Limited to Skin Limited to Skin Breakdown Breakdown Epithelialization: Medium (34-66%) None N/A Periwound Skin Texture: Edema: Yes Edema: Yes N/A Scarring: Yes Excoriation: No Excoriation: No Induration: No Induration: No Callus: No Callus: No Crepitus: No Crepitus: No Fluctuance: No Fluctuance: No Friable: No Friable: No Rash: No Rash: No Scarring: No Periwound Skin Maceration: Yes Maceration: No N/A Moisture: Moist: Yes Moist: No Dry/Scaly: No Dry/Scaly: No Periwound Skin Color: Hemosiderin Staining: Yes Atrophie Blanche: No N/A Atrophie Blanche: No Cyanosis: No Cyanosis: No Ecchymosis: No Ecchymosis: No Erythema: No Erythema: No Hemosiderin Staining: No Mottled: No Mottled: No Pallor: No Pallor: No Rubor: No Rubor: No Temperature: N/A No Abnormality N/A Tenderness on No No N/A Palpation: Wound Preparation: Ulcer Cleansing: Other: Ulcer Cleansing: N/A soap and water Rinsed/Irrigated with Saline Topical Anesthetic Applied: Other: lidocaine Topical Anesthetic 4% Applied: None Treatment Notes Electronic Signature(s) Signed: 04/05/2015 1:30:14 PM By: Elliot Gurney, RN, BSN, Kim RN, BSN Entered By: Elliot Gurney, RN, BSN, Kim on 04/05/2015 09:03:52 Maurice Maurice Sanders ([id number]) -------------------------------------------------------------------------------- Multi-Disciplinary Care Plan Details Patient Name: Maurice Maurice Sanders Date of Service: 04/05/2015 8:45 AM Medical Record Number: [id number] Patient Account Number: [id number] Date of Birth/Sex: Sep 29, 1945 (69 y.o. Male) Treating RN: Huel Coventry Primary Care Physician: Beverely Risen Other Clinician: Referring Physician: Beverely Risen Treating Physician/Extender: Rudene Re in Treatment: 8 Active Inactive Abuse / Safety / Falls / Self Care  Management Nursing Diagnoses: Potential for falls Goals: Patient will remain injury free Date Initiated: 07/10/2014 Goal Status: Active Interventions: Assess fall risk on admission and as needed Notes: Nutrition Nursing Diagnoses: Potential for alteratiion in Nutrition/Potential for imbalanced nutrition Goals: Patient/caregiver agrees to and verbalizes understanding of need to use nutritional supplements and/or vitamins as prescribed Date Initiated: 07/10/2014 Goal Status: Active Interventions: Assess patient nutrition upon admission and as needed per policy Notes: Orientation to the Wound Care Program Nursing Diagnoses: Knowledge deficit related to the wound healing center program Goals: Patient/caregiver will verbalize understanding of the Wound Healing Center Program [place], [place] ([id number]) Date Initiated: 07/10/2014 Goal Status: Active Interventions: Provide education on orientation to the wound center Notes: Wound/Skin Impairment Nursing Diagnoses: Impaired tissue integrity Goals: Ulcer/skin breakdown will have a volume reduction of 30% by week 4 Date Initiated: 07/10/2014 Goal Status: Active Interventions: Assess ulceration(s) every visit Notes: Electronic Signature(s) Signed: 04/05/2015 1:30:14 PM By: Elliot Gurney, RN, BSN, Kim RN, BSN Entered By: Elliot Gurney, RN, BSN, Kim on 04/05/2015 09:03:46 Maurice Maurice Sanders ([id number]) -------------------------------------------------------------------------------- Patient/Caregiver Education Details Patient Name: Maurice Maurice Sanders Date of Service: 04/05/2015 8:45 AM Medical Record Number: [id number] Patient Account Number: [id number] Date of Birth/Gender: 1946/01/04 (69  y.o. Male) Treating RN: Huel Coventry Primary Care Physician: Beverely Risen Other Clinician: Referring Physician: Beverely Risen Treating Physician/Extender: Rudene Re in Treatment: 39 Education Assessment Education Provided To: Patient Education Topics  Provided Venous: Controlling Swelling with Multilayered Compression Wraps, Other: continue wound care as Handouts: prescribed Electronic Signature(s) Signed: 04/05/2015 1:30:14 PM By: Elliot Gurney, RN, BSN, Kim RN, BSN Entered By: Elliot Gurney, RN, BSN, Kim on 04/05/2015 09:35:30 Maurice Maurice Sanders ([id number]) -------------------------------------------------------------------------------- Wound Assessment Details Patient Name: Maurice Maurice Sanders Date of Service: 04/05/2015 8:45 AM Medical Record Number: [id number] Patient Account Number: [id number] Date of Birth/Sex: 16-Apr-1946 (69 y.o. Male) Treating RN: Huel Coventry Primary Care Physician: Beverely Risen Other Clinician: Referring Physician: Beverely Risen Treating Physician/Extender: Rudene Re in Treatment: 38 Wound Status Wound Number: 2 Primary Venous Leg Ulcer Etiology: Wound Location: Right, Proximal, Medial Malleolus Wound Open Status: Wounding Event: Gradually Appeared Comorbid Sleep Apnea, Congestive Heart Failure, Date Acquired: 07/31/2014 History: Deep Vein Thrombosis, Hypertension, Weeks Of Treatment: 35 Type II Diabetes, Gout Clustered Wound: No Photos Photo Uploaded By: Elliot Gurney, RN, BSN, Kim on 04/05/2015 09:06:42 Wound Measurements Length: (cm) 4 Width: (cm) 2 Depth: (cm) 0.1 Area: (cm) 6.283 Volume: (cm) 0.628 % Reduction in Area: -788.7% % Reduction in Volume: -784.5% Epithelialization: Medium (34-66%) Wound Description Full Thickness Without Exposed Classification: Support Structures Diabetic Severity Grade 1 (Wagner): Wound Margin: Indistinct, nonvisible Exudate Amount: Large Exudate Type: Serous Exudate Color: amber Wound Bed Granulation Amount: Small (1-33%) Exposed Structure Sanders, Maurice Sanders ([id number]) Granulation Quality: Red Fascia Exposed: No Necrotic Amount: Medium (34-66%) Fat Layer Exposed: No Necrotic Quality: Adherent Slough Tendon Exposed: No Muscle Exposed: No Joint Exposed: No Bone Exposed:  No Limited to Skin Breakdown Periwound Skin Texture Texture Color No Abnormalities Noted: No No Abnormalities Noted: No Callus: No Atrophie Blanche: No Crepitus: No Cyanosis: No Excoriation: No Ecchymosis: No Fluctuance: No Erythema: No Friable: No Hemosiderin Staining: Yes Induration: No Mottled: No Localized Edema: Yes Pallor: No Rash: No Rubor: No Scarring: Yes Moisture No Abnormalities Noted: No Dry / Scaly: No Maceration: Yes Moist: Yes Wound Preparation Ulcer Cleansing: Other: soap and water, Topical Anesthetic Applied: Other: lidocaine 4%, Treatment Notes Wound #2 (Right, Proximal, Medial Malleolus) 1. Cleansed with: Cleanse wound with antibacterial soap and water 2. Anesthetic Topical Lidocaine 4% cream to wound bed prior to debridement 4. Dressing Applied: Other dressing (specify in notes) 7. Secured with 4-Layer Compression System - Right Lower Extremity Notes Apligraf, drawtex, ABD 4 layer Electronic Signature(s) Signed: 04/05/2015 1:30:14 PM By: Elliot Gurney, RN, BSN, Kim RN, BSN Entered By: Elliot Gurney, RN, BSN, Kim on 04/05/2015 09:19:39 Maurice Maurice Sanders ([id number]) Maurice Sanders, Maurice Maurice Sanders ([id number]) -------------------------------------------------------------------------------- Wound Assessment Details Patient Name: Maurice Maurice Sanders Date of Service: 04/05/2015 8:45 AM Medical Record Number: [id number] Patient Account Number: [id number] Date of Birth/Sex: 04-May-1946 (69 y.o. Male) Treating RN: Huel Coventry Primary Care Physician: Beverely Risen Other Clinician: Referring Physician: Beverely Risen Treating Physician/Extender: Rudene Re in Treatment: 38 Wound Status Wound Number: 6 Primary Lymphedema Etiology: Wound Location: Right Lower Leg - Proximal Wound Open Wounding Event: Gradually Appeared Status: Date Acquired: 03/15/2015 Comorbid Sleep Apnea, Congestive Heart Failure, Weeks Of Treatment: 3 History: Deep Vein Thrombosis, Hypertension, Clustered Wound:  No Type II Diabetes, Gout Photos Photo Uploaded By: Elliot Gurney, RN, BSN, Kim on 04/05/2015 09:06:42 Wound Measurements Length: (cm) 0 % Reduction Width: (cm) 0 % Reduction Depth: (cm) 0 Epithelializ Area: (cm) 0 Volume: (cm) 0 in Area: 100% in Volume: 100% ation: None Wound Description Full Thickness Without Exposed Foul Odor A Classification: Support Structures  Diabetic Severity Grade 1 (Wagner): Wound Margin: Distinct, outline attached Exudate Amount: Medium Exudate Type: Serosanguineous Exudate Color: red, brown fter Cleansing: No Wound Bed Granulation Amount: Large (67-100%) Exposed Structure Maurice Sanders, Maurice Sanders ([id number]) Granulation Quality: Pink, Pale Fascia Exposed: No Necrotic Amount: None Present (0%) Fat Layer Exposed: No Tendon Exposed: No Muscle Exposed: No Joint Exposed: No Bone Exposed: No Limited to Skin Breakdown Periwound Skin Texture Texture Color No Abnormalities Noted: No No Abnormalities Noted: No Callus: No Atrophie Blanche: No Crepitus: No Cyanosis: No Excoriation: No Ecchymosis: No Fluctuance: No Erythema: No Friable: No Hemosiderin Staining: No Induration: No Mottled: No Localized Edema: Yes Pallor: No Rash: No Rubor: No Scarring: No Temperature / Pain Moisture Temperature: No Abnormality No Abnormalities Noted: No Dry / Scaly: No Maceration: No Moist: No Wound Preparation Ulcer Cleansing: Rinsed/Irrigated with Saline Topical Anesthetic Applied: None Electronic Signature(s) Signed: 04/05/2015 1:30:14 PM By: Elliot Gurney, RN, BSN, Kim RN, BSN Entered By: Elliot Gurney, RN, BSN, Kim on 04/05/2015 09:02:48 Maurice Maurice Sanders ([id number]) -------------------------------------------------------------------------------- Vitals Details Patient Name: Maurice Maurice Sanders Date of Service: 04/05/2015 8:45 AM Medical Record Number: [id number] Patient Account Number: [id number] Date of Birth/Sex: [birth year] (69 y.o. Male) Treating RN: Huel Coventry Primary Care  Physician: Beverely Risen Other Clinician: Referring Physician: Beverely Risen Treating Physician/Extender: Rudene Re in Treatment: 38 Vital Signs Time Taken: 08:57 Temperature (F): 98.2 Height (in): 71 Pulse (bpm): 69 Weight (lbs): 291.7 Respiratory Rate (breaths/min): 18 Body Mass Index (BMI): 40.7 Blood Pressure (mmHg): 163/62 Reference Range: 80 - 120 mg / dl Electronic Signature(s) Signed: 04/05/2015 1:30:14 PM By: Elliot Gurney, RN, BSN, Kim RN, BSN Entered By: Elliot Gurney, RN, BSN, Kim on 04/05/2015 08:57:39

## 2015-04-06 NOTE — Progress Notes (Signed)
BASIR, NIVEN ([id number]) Visit Report for 04/05/2015 Chief Complaint Document Details Patient Name: Maurice, Sanders Date of Service: 04/05/2015 8:45 AM Medical Record Number: [id number] Patient Account Number: [id number] Date of Birth/Sex: 01/07/1946 (69 y.o. Male) Treating RN: Huel Coventry Primary Care Physician: Beverely Risen Other Clinician: Referring Physician: Beverely Risen Treating Physician/Extender: Rudene Re in Treatment: 22 Information Obtained from: Patient Chief Complaint Felice has been coming for dressing changes twice a week, on Monday and Thursday. Over all he is doing fine he has his blood sugar under control and is managing his diuretics well. after changing over from calamine to zinc oxide he said his own Unna's boot still gave him a bit of itching and problems and he had significant swelling which made the Unna's boot fairly tight. Electronic Signature(s) Signed: 04/05/2015 9:32:36 AM By: Evlyn Kanner MD, FACS Entered By: Evlyn Kanner on 04/05/2015 09:32:36 Maurice Sanders ([id number]) -------------------------------------------------------------------------------- Cellular or Tissue Based Product Details Patient Name: Maurice Sanders Date of Service: 04/05/2015 8:45 AM Medical Record Number: [id number] Patient Account Number: [id number] Date of Birth/Sex: [birth year] (69 y.o. Male) Treating RN: Huel Coventry Primary Care Physician: Beverely Risen Other Clinician: Referring Physician: Beverely Risen Treating Physician/Extender: Rudene Re in Treatment: 43 Cellular or Tissue Based Wound #2 Right,Proximal,Medial Malleolus Product Type Applied to: Performed By: Physician Tristan Schroeder., MD Cellular or Tissue Based Apligraf Product Type: Time-Out Taken: Yes Location: trunk / arms / legs Wound Size (sq cm): 8 Product Size (sq cm): 44 Waste Size (sq cm): 36 Waste Reason: wound size Amount of Product Applied (sq cm): 8 Lot #: [id number] Order #:  [id number] Expiration Date: 04/11/2015 Fenestrated: Yes Instrument: Blade Reconstituted: Yes Solution Type: nacl Solution Amount: 3ml Lot #: [id number] Solution Expiration 05/04/2017 Date: Secured: Yes Secured With: Steri-Strips Dressing Applied: Yes Primary Dressing: mepitel one Procedural Pain: 0 Post Procedural Pain: 0 Response to Treatment: Procedure was tolerated well Post Procedure Diagnosis Same as Pre-procedure Electronic Signature(s) Signed: 04/05/2015 9:32:29 AM By: Evlyn Kanner MD, FACS Entered By: Evlyn Kanner on 04/05/2015 09:32:29 Maurice Sanders ([id number]) Maurice Sanders, Maurice Sanders ([id number]) -------------------------------------------------------------------------------- HPI Details Patient Name: Maurice Sanders Date of Service: 04/05/2015 8:45 AM Medical Record Number: [id number] Patient Account Number: [id number] Date of Birth/Sex: 09/13/1945 (69 y.o. Male) Treating RN: Huel Coventry Primary Care Physician: Beverely Risen Other Clinician: Referring Physician: Beverely Risen Treating Physician/Extender: Rudene Re in Treatment: 30 History of Present Illness Location: right lower extremity Quality: Patient reports experiencing a dull pain to affected area(s). Severity: patient denies any severe pain. Duration: he has had this wound for a few weeks now. Timing: Pain in wound is Intermittent (comes and goes Context: the patient has a long history of venous insufficiency. He has been very compliant with wearing his compression stockings. Modifying Factors: the patient normally wears compression stockings. Recently he had placed a fair amount of Vaseline which cause skin irritation. Associated Signs and Symptoms: Patient denies any fevers. He has had a moderate amount of drainage. HPI Description: The patient is 69 year-old male with a history of diabetes who presents with right lower extremity wound. He first presented to our clinic on 07/10/2014. He does have a history of  venous insufficiency in the past. He has been very compliant with wearing his compression stockings. He recently Vaseline on his legs which caused skin irritation and breakdown. The patient returns for follow-up today. he denies any fevers at home. However due to the fact that he had stopped his diuretic he has had a lot of edema  of the lower extremity. He also has a lot of scaly skin on his right lower extremity. 10/02/14 -- he has resumed his diuretics on a regular basis and has been walking around adequately and has overall improved. No pain or no discharge from the wounds. 10/09/14 -- the patient has been having some pain at night while sleeping and was requesting pain medications. He also says that due to taking his diuretics he's been getting some cramps and I have asked him to please see his PCP regarding both these problems. Also awaiting insurance clearance regarding his Apligraf. 10/16/2014. Vascular note reviewed in detail. I have been able to review his notes from Dr. Gilda Crease on 08/17/2014. 1.The venous duplex study done on 08/17/2014 showed that all veins visualized showed no evidence of DVT. 2. no incompetence of the greater small saphenous veins bilaterally. The plan was that he had no surgery or intervention needed at this point. The long discussion had showed that the patient had chronic skin changes accompanied by venous insufficiency and at this stage he would benefit from wearing graduated compression stockings of the 20-30 mmHg on a daily basis and review would be done in his office back again in 6 months. At that time the patient could have a lymph pump. 10/23/14 -- Culture report --- His wound culture has grown Citrobacter koseri and Enterococcus faecalis. Final report is back but so far sensitive to Bactrim and ampicillin and ciprofloxacin. Bactrim DS was called in for 14 days. Maurice, Sanders ([id number]) 11/13/2014 last week we changed his Unna boot on Monday and Thursday  and this has helped him a lot. The edema has been much better the Unna boot hasn't slipped down and he has not had any skin abrasions. 11/20/2014 -- he saw the vascular surgeon Dr. Gilda Crease this morning and he was pleased with his progress and has said he would be ordering him some lymphedema pumps. Other than that the patient is coming to change his Unna's boots twice a week and he is doing well with this. 11/27/2014 -- over Saturday and Sunday he started having more swelling in his leg and some pain and I believe his own Unna's boot caused him some discomfort and excoriation of skin. He does admit he took some more juices orally but he has continued his diuretic. 12/11/2014 -- he has completed his course of antibiotics and has had no fresh issues. He has had no pain no swelling and is managing well with his blood sugars and diuretics. 12/14/2014 between Monday and today he has had some dietary indiscretions and had some [place] food which may have made him retain a lot of fluid. His edema has increased markedly and a lot of more excoriation. 12/25/2014 -- This week he has really done well and his edema is under control and these had no pain or problems. His leg is feeling good. 01/04/2015 -- He has done great along the way and he has managed to keep his wrap for almost her entire week. No issues or problems. 02/01/2015 -- he is doing fine and easier for his first application of the Apligraf skin substitute. 02/08/2015 -- he had a lot of drainage to the dressing over the Apligraf and this will be addressed today. 02/15/2015 -- his wound looks very good and there was not much of drainage. He will have a second application of Apligraf. 02/22/2015 -- his Apligraf is in place and he has significant drainage. 02/27/2015 -- he is here for his third application of Apligraf  03/06/2015 -- he is doing well and is here for a wound check. He did have significant drainage this week too. 03/15/2015 -- he  has done better with his wound and not had any reaction to the Profore 4-layer wrap. He does feel however that a small lace opened where the Steri-Strip was. He does not have an Apligraf ordered for him today. 03/21/2015 -- he is done fine and is here for his fourth application of Apligraf. 03/27/2015 -- he is here for a wound check and other than that has been tolerating his Profore 4-layer wrap. 04/05/2015 -- he has done fine and is here for his fifth application of Apligraf Electronic Signature(s) Signed: 04/05/2015 9:33:00 AM By: Evlyn Kanner MD, FACS Entered By: Evlyn Kanner on 04/05/2015 09:33:00 Maurice Sanders ([id number]) -------------------------------------------------------------------------------- Physical Exam Details Patient Name: Maurice Sanders Date of Service: 04/05/2015 8:45 AM Medical Record Number: [id number] Patient Account Number: [id number] Date of Birth/Sex: June 14, 1946 (69 y.o. Male) Treating RN: Huel Coventry Primary Care Physician: Beverely Risen Other Clinician: Referring Physician: Beverely Risen Treating Physician/Extender: Rudene Re in Treatment: 38 Constitutional . Pulse regular. Respirations normal and unlabored. Afebrile. . Eyes Nonicteric. Reactive to light. Ears, Nose, Mouth, and Throat Lips, teeth, and gums WNL.Marland Kitchen Moist mucosa without lesions . Neck supple and nontender. No palpable supraclavicular or cervical adenopathy. Normal sized without goiter. Respiratory WNL. No retractions.. Breath sounds WNL, No rubs, rales, rhonchi, or wheeze.. Cardiovascular Heart rhythm and rate regular, no murmur or gallop.. Pedal Pulses WNL. No clubbing, cyanosis or edema. Chest Breasts symmetical and no nipple discharge.. Breast tissue WNL, no masses, lumps, or tenderness.. Lymphatic No adneopathy. No adenopathy. No adenopathy. Musculoskeletal Adexa without tenderness or enlargement.. Digits and nails w/o clubbing, cyanosis, infection, petechiae, ischemia, or  inflammatory conditions.. Integumentary (Hair, Skin) No suspicious lesions. No crepitus or fluctuance. No peri-wound warmth or erythema. No masses.Marland Kitchen Psychiatric Judgement and insight Intact.. No evidence of depression, anxiety, or agitation.. Notes He has got 3 small open wounds on the area which he previously had a large ulceration and all 3 are clean and will be covered with Apligraf appropriately. Electronic Signature(s) Signed: 04/05/2015 9:33:35 AM By: Evlyn Kanner MD, FACS Entered By: Evlyn Kanner on 04/05/2015 09:33:34 Maurice Sanders ([id number]) -------------------------------------------------------------------------------- Physician Orders Details Patient Name: Maurice Sanders Date of Service: 04/05/2015 8:45 AM Medical Record Number: [id number] Patient Account Number: [id number] Date of Birth/Sex: [birth year] (69 y.o. Male) Treating RN: Huel Coventry Primary Care Physician: Beverely Risen Other Clinician: Referring Physician: Beverely Risen Treating Physician/Extender: Rudene Re in Treatment: 24 Verbal / Phone Orders: Yes Clinician: Huel Coventry Read Back and Verified: Yes Diagnosis Coding Wound Cleansing Wound #2 Right,Proximal,Medial Malleolus o Clean wound with Normal Saline. Anesthetic Wound #2 Right,Proximal,Medial Malleolus o Topical Lidocaine 4% cream applied to wound bed prior to debridement Primary Wound Dressing Wound #2 Right,Proximal,Medial Malleolus o Other: - Mepitel secured by steri-strips - apligraf applied by Dr Meyer Russel today - do not remove below meptiel next week Secondary Dressing Wound #2 Right,Proximal,Medial Malleolus o ABD pad o Drawtex Dressing Change Frequency Wound #2 Right,Proximal,Medial Malleolus o Change dressing every week Follow-up Appointments Wound #2 Right,Proximal,Medial Malleolus o Return Appointment in 1 week. Edema Control o 4-Layer Compression System - Right Lower Extremity Advanced Therapies Wound #2  Right,Proximal,Medial Malleolus o Apligraf application in clinic; including contact layer, fixation with steri strips, dry gauze and cover dressing. Maurice, Sanders ([id number]) Electronic Signature(s) Signed: 04/05/2015 1:30:14 PM By: Elliot Gurney RN, BSN, Kim RN, BSN Signed: 04/05/2015 4:57:18 PM By: Evlyn Kanner  MD, FACS Entered By: Elliot Gurney, RN, BSN, Kim on 04/05/2015 09:10:58 Maurice Sanders ([id number]) -------------------------------------------------------------------------------- Problem List Details Patient Name: Maurice Sanders Date of Service: 04/05/2015 8:45 AM Medical Record Number: [id number] Patient Account Number: [id number] Date of Birth/Sex: Aug 20, 1945 (69 y.o. 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 Male) Treating RN: Huel Coventry Primary Care Physician: Beverely Risen Other Clinician: Referring Physician: Beverely Risen Treating Physician/Extender: Rudene Re in Treatment: 38 Subjective Chief Complaint Information obtained from Patient Malakie has been  coming for dressing changes twice a week, on Monday and Thursday. Over all he is doing fine he has his blood sugar under control and is managing his diuretics well. after changing over from calamine to zinc oxide he said his own Unna's boot still gave him a bit of itching and problems and he had significant swelling which made the Unna's boot fairly tight. History of Present Illness (HPI) The following HPI elements were documented for the patient's wound: Location: right lower extremity Quality: Patient reports experiencing a dull pain to affected area(s). Severity: patient denies any severe pain. Duration: he has had this wound for a few weeks now. Timing: Pain in wound is Intermittent (comes and goes Context: the patient has a long history of venous insufficiency. He has been very compliant with wearing his compression stockings. Modifying Factors: the patient normally wears compression stockings. Recently he had placed a fair amount of Vaseline which cause skin irritation. Associated Signs and Symptoms: Patient denies any fevers. He has had a moderate amount of drainage. The patient is 70 year-old male with a history of diabetes who presents with right lower extremity wound. He first presented to our clinic on 07/10/2014. He does have a history of venous insufficiency in the past. He has been very compliant with wearing his compression stockings. He recently Vaseline on his legs which caused skin irritation and breakdown. The patient returns for follow-up today. he denies any fevers at home. However due to the fact that he had stopped his diuretic he has had a lot of edema of the lower extremity. He also has a lot of scaly skin on his right lower extremity. 10/02/14 -- he has resumed his diuretics on a regular basis and has been walking around adequately and has overall improved. No pain or no discharge from the wounds. 10/09/14 -- the patient has been having some pain at night while  sleeping and was requesting pain medications. He also says that due to taking his diuretics he's been getting some cramps and I have asked him to please see his PCP regarding both these problems. Also awaiting insurance clearance regarding his Apligraf. 10/16/2014. Vascular note reviewed in detail. I have been able to review his notes from Dr. Gilda Crease on 08/17/2014. 1.The venous duplex study done on 08/17/2014 showed that all veins visualized showed no evidence of  DVT. Maurice, Sanders ([id number]) 2. no incompetence of the greater small saphenous veins bilaterally. The plan was that he had no surgery or intervention needed at this point. The long discussion had showed that the patient had chronic skin changes accompanied by venous insufficiency and at this stage he would benefit from wearing graduated compression stockings of the 20-30 mmHg on a daily basis and review would be done in his office back again in 6 months. At that time the patient could have a lymph pump. 10/23/14 -- Culture report --- His wound culture has grown Citrobacter koseri and Enterococcus faecalis. Final report is back but so far sensitive to Bactrim and ampicillin and ciprofloxacin. Bactrim DS was called in for 14 days. 11/13/2014 last week we changed his Unna boot on Monday and Thursday and this has helped him a lot. The edema has been much better the Unna boot hasn't slipped down and he has not had any skin abrasions. 11/20/2014 -- he saw the vascular surgeon Dr. Gilda Crease this morning and he was pleased with his progress and has said he would be ordering him some lymphedema pumps. Other than that the patient is coming to change his Unna's boots twice a week and he is doing well with this. 11/27/2014 -- over Saturday and Sunday he started having more swelling in his leg and some pain and I believe his own Unna's boot caused him some discomfort and excoriation of skin. He does admit he took some more juices orally but he  has continued his diuretic. 12/11/2014 -- he has completed his course of antibiotics and has had no fresh issues. He has had no pain no swelling and is managing well with his blood sugars and diuretics. 12/14/2014 between Monday and today he has had some dietary indiscretions and had some [place] food which may have made him retain a lot of fluid. His edema has increased markedly and a lot of more excoriation. 12/25/2014 -- This week he has really done well and his edema is under control and these had no pain or problems. His leg is feeling good. 01/04/2015 -- He has done great along the way and he has managed to keep his wrap for almost her entire week. No issues or problems. 02/01/2015 -- he is doing fine and easier for his first application of the Apligraf skin substitute. 02/08/2015 -- he had a lot of drainage to the dressing over the Apligraf and this will be addressed today. 02/15/2015 -- his wound looks very good and there was not much of drainage. He will have a second application of Apligraf. 02/22/2015 -- his Apligraf is in place and he has significant drainage. 02/27/2015 -- he is here for his third application of Apligraf 03/06/2015 -- he is doing well and is here for a wound check. He did have significant drainage this week too. 03/15/2015 -- he has done better with his wound and not had any reaction to the Profore 4-layer wrap. He does feel however that a small lace opened where the Steri-Strip was. He does not have an Apligraf ordered for him today. 03/21/2015 -- he is done fine and is here for his fourth application of Apligraf. 03/27/2015 -- he is here for a wound check and other than that has been tolerating his Profore 4-layer wrap. 04/05/2015 -- he has done fine and is here for his fifth application of Apligraf Maurice Sanders, Maurice Sanders ([id number]) Objective Constitutional Pulse regular. Respirations normal and unlabored. Afebrile. Vitals Time Taken: 8:57 AM, Height: 71 in,  Weight: 291.7 lbs, BMI: 40.7, Temperature: 98.2 F, Pulse: 69 bpm, Respiratory Rate: 18 breaths/min, Blood Pressure: 163/62 mmHg. Eyes Nonicteric. Reactive to light. Ears, Nose, Mouth, and Throat Lips, teeth, and gums WNL.Marland Kitchen Moist mucosa without lesions . Neck supple and nontender. No palpable supraclavicular or cervical adenopathy. Normal sized without goiter. Respiratory WNL. No retractions.. Breath sounds WNL, No rubs, rales, rhonchi, or wheeze.. Cardiovascular Heart rhythm and rate regular, no murmur or gallop.. Pedal Pulses WNL. No clubbing, cyanosis or edema. Chest Breasts symmetical and no nipple discharge.. Breast tissue WNL, no masses, lumps, or tenderness.. Lymphatic No adneopathy. No adenopathy. No adenopathy. Musculoskeletal Adexa without tenderness or enlargement.. Digits and nails w/o clubbing, cyanosis, infection, petechiae, ischemia, or inflammatory conditions.Marland Kitchen Psychiatric Judgement and insight Intact.. No evidence of depression, anxiety, or agitation.. General Notes: He has got 3 small open wounds on the area which he previously had a large ulceration and all 3 are clean and will be covered with Apligraf appropriately. Integumentary (Hair, Skin) No suspicious lesions. No crepitus or fluctuance. No peri-wound warmth or erythema. No masses.Marland Kitchen Maurice, Sanders ([id number]) Wound #2 status is Open. Original cause of wound was Gradually Appeared. The wound is located on the Right,Proximal,Medial Malleolus. The wound measures 4cm length x 2cm width x 0.1cm depth; 6.283cm^2 area and 0.628cm^3 volume. The wound is limited to skin breakdown. There is a large amount of serous drainage noted. The wound margin is indistinct and nonvisible. There is small (1-33%) red granulation within the wound bed. There is a medium (34-66%) amount of necrotic tissue within the wound bed including Adherent Slough. The periwound skin appearance exhibited: Localized Edema, Scarring, Maceration,  Moist, Hemosiderin Staining. The periwound skin appearance did not exhibit: Callus, Crepitus, Excoriation, Fluctuance, Friable, Induration, Rash, Dry/Scaly, Atrophie Blanche, Cyanosis, Ecchymosis, Mottled, Pallor, Rubor, Erythema. Wound #6 status is Open. Original cause of wound was Gradually Appeared. The wound is located on the Right,Proximal Lower Leg. The wound measures 0cm length x 0cm width x 0cm depth; 0cm^2 area and 0cm^3 volume. The wound is limited to skin breakdown. There is a medium amount of serosanguineous drainage noted. The wound margin is distinct with the outline attached to the wound base. There is large (67-100%) pink, pale granulation within the wound bed. There is no necrotic tissue within the wound bed. The periwound skin appearance exhibited: Localized Edema. The periwound skin appearance did not exhibit: Callus, Crepitus, Excoriation, Fluctuance, Friable, Induration, Rash, Scarring, Dry/Scaly, Maceration, Moist, Atrophie Blanche, Cyanosis, Ecchymosis, Hemosiderin Staining, Mottled, Pallor, Rubor, Erythema. Periwound temperature was noted as No Abnormality. 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 He will continue all symptomatic treatment and see me back next week for a wound check. Procedures Wound #2 Wound #2 is a Venous Leg Ulcer located on the Right,Proximal,Medial Malleolus. A skin graft procedure using a bioengineered skin substitute/cellular or tissue based product was performed by Mylinh Cragg, Ignacia Felling., MD. Apligraf was applied and secured with Steri-Strips. 8 sq cm of product was utilized and 36 sq cm was wasted due to wound size. Post  Application, mepitel one was applied. A Time Out was conducted prior to Maurice Sanders, KOSTAS ([id number]) the start of the procedure. The procedure was tolerated well with a pain level  of 0 throughout and a pain level of 0 following the procedure. Post procedure Diagnosis Wound #2: Same as Pre-Procedure . Plan Wound Cleansing: Wound #2 Right,Proximal,Medial Malleolus: Clean wound with Normal Saline. Anesthetic: Wound #2 Right,Proximal,Medial Malleolus: Topical Lidocaine 4% cream applied to wound bed prior to debridement Primary Wound Dressing: Wound #2 Right,Proximal,Medial Malleolus: Other: - Mepitel secured by steri-strips - apligraf applied by Dr Meyer Russel today - do not remove below meptiel next week Secondary Dressing: Wound #2 Right,Proximal,Medial Malleolus: ABD pad Drawtex Dressing Change Frequency: Wound #2 Right,Proximal,Medial Malleolus: Change dressing every week Follow-up Appointments: Wound #2 Right,Proximal,Medial Malleolus: Return Appointment in 1 week. Edema Control: 4-Layer Compression System - Right Lower Extremity Advanced Therapies: Wound #2 Right,Proximal,Medial Malleolus: Apligraf application in clinic; including contact layer, fixation with steri strips, dry gauze and cover dressing. With the usual precautions Apligraf has been applied and the area has been bolstered and wrapped with a 4 layer Profore. He will continue all symptomatic treatment and see me back next week for a wound check. Electronic Signature(s) Maurice Sanders, Maurice Sanders ([id number]) Signed: 04/05/2015 9:34:14 AM By: Evlyn Kanner MD, FACS Entered By: Evlyn Kanner on 04/05/2015 09:34:14 Maurice Sanders ([id number]) -------------------------------------------------------------------------------- SuperBill Details Patient Name: Maurice Sanders Date of Service: 04/05/2015 Medical Record Number: [id number] Patient Account Number: [id number] Date of Birth/Sex: [birth year] (69 y.o. 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 ulcer of ankle Facility Procedures CPT4 Code Description: 09323557 (Facility Use Only) Apligraf 1 SQ CM Modifier: Quantity: 44 CPT4 Code Description: 32202542 15271 - SKIN SUB GRAFT TRNK/ARM/LEG ICD-10 Description Diagnosis 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 I83.013 Varicose veins of right lower  extremity with ulcer o Modifier: of right lowe f ankle Quantity: 1 r extremity Physician Procedures CPT4 Code Description: 7062376 15271 - WC PHYS SKIN SUB GRAFT TRNK/ARM/LEG ICD-10 Description Diagnosis I87.311 Chronic venous hypertension (idiopathic) with ulcer o 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

## 2015-04-12 ENCOUNTER — Encounter: Payer: Medicare Other | Admitting: Surgery

## 2015-04-12 DIAGNOSIS — E11620 Type 2 diabetes mellitus with diabetic dermatitis: Secondary | ICD-10-CM | POA: Diagnosis not present

## 2015-04-12 DIAGNOSIS — L97311 Non-pressure chronic ulcer of right ankle limited to breakdown of skin: Secondary | ICD-10-CM | POA: Diagnosis not present

## 2015-04-12 DIAGNOSIS — R600 Localized edema: Secondary | ICD-10-CM | POA: Diagnosis not present

## 2015-04-12 DIAGNOSIS — I87311 Chronic venous hypertension (idiopathic) with ulcer of right lower extremity: Secondary | ICD-10-CM | POA: Diagnosis not present

## 2015-04-12 DIAGNOSIS — I83013 Varicose veins of right lower extremity with ulcer of ankle: Secondary | ICD-10-CM | POA: Diagnosis not present

## 2015-04-13 NOTE — Progress Notes (Addendum)
Maurice Sanders, Maurice Sanders ([id number]) Visit Report for 04/12/2015 Chief Complaint Document Details Patient Name: Maurice Sanders Date of Service: 04/12/2015 11:30 AM Medical Record Number: [id number] Patient Account Number: [id number] Date of Birth/Sex: April 12, 1946 (69 y.o. Male) Treating RN: Curtis Sites Primary Care Physician: Beverely Risen Other Clinician: Referring Physician: Beverely Risen Treating Physician/Extender: Rudene Re in Treatment: 23 Information Obtained from: Patient Chief Complaint [name] has been coming for dressing changes twice a week, on Monday and Thursday. Over all he is doing fine he has his blood sugar under control and is managing his diuretics well. after changing over from calamine to zinc oxide he said his own Unna's boot still gave him a bit of itching and problems and he had significant swelling which made the Unna's boot fairly tight. Electronic Signature(s) Signed: 04/12/2015 12:01:16 PM By: Evlyn Kanner MD, FACS Entered By: Evlyn Kanner on 04/12/2015 12:01:16 Maurice Sanders ([id number]) -------------------------------------------------------------------------------- HPI Details Patient Name: Maurice Sanders Date of Service: 04/12/2015 11:30 AM Medical Record Number: [id number] Patient Account Number: [id number] Date of Birth/Sex: 18-Jun-1946 (69 y.o. Male) Treating RN: Curtis Sites Primary Care Physician: Beverely Risen Other Clinician: Referring Physician: Beverely Risen Treating Physician/Extender: Rudene Re in Treatment: 36 History of Present Illness Location: right lower extremity Quality: Patient reports experiencing a dull pain to affected area(s). Severity: patient denies any severe pain. Duration: he has had this wound for a few weeks now. Timing: Pain in wound is Intermittent (comes and goes Context: the patient has a long history of venous insufficiency. He has been very compliant with wearing his compression stockings. Modifying Factors: the  patient normally wears compression stockings. Recently he had placed a fair amount of Vaseline which cause skin irritation. Associated Signs and Symptoms: Patient denies any fevers. He has had a moderate amount of drainage. HPI Description: The patient is 69 year-old male with a history of diabetes who presents with right lower extremity wound. He first presented to our clinic on 07/10/2014. He does have a history of venous insufficiency in the past. He has been very compliant with wearing his compression stockings. He recently Vaseline on his legs which caused skin irritation and breakdown. The patient returns for follow-up today. he denies any fevers at home. However due to the fact that he had stopped his diuretic he has had a lot of edema of the lower extremity. He also has a lot of scaly skin on his right lower extremity. 10/02/14 -- he has resumed his diuretics on a regular basis and has been walking around adequately and has overall improved. No pain or no discharge from the wounds. 10/09/14 -- the patient has been having some pain at night while sleeping and was requesting pain medications. He also says that due to taking his diuretics he's been getting some cramps and I have asked him to please see his PCP regarding both these problems. Also awaiting insurance clearance regarding his Apligraf. 10/16/2014. Vascular note reviewed in detail. I have been able to review his notes from Dr. Gilda Crease on 08/17/2014. 1.The venous duplex study done on 08/17/2014 showed that all veins visualized showed no evidence of DVT. 2. no incompetence of the greater small saphenous veins bilaterally. The plan was that he had no surgery or intervention needed at this point. The long discussion had showed that the patient had chronic skin changes accompanied by venous insufficiency and at this stage he would benefit from wearing graduated compression stockings of the 20-30 mmHg on a daily basis and review would  be done in  his office back again in 6 months. At that time the patient could have a lymph pump. 10/23/14 -- Culture report --- His wound culture has grown Citrobacter koseri and Enterococcus faecalis. Final report is back but so far sensitive to Bactrim and ampicillin and ciprofloxacin. Bactrim DS was called in for 14 days. Maurice Sanders, Maurice Sanders ([id number]) 11/13/2014 last week we changed his Unna boot on Monday and Thursday and this has helped him a lot. The edema has been much better the Unna boot hasn't slipped down and he has not had any skin abrasions. 11/20/2014 -- he saw the vascular surgeon Dr. Gilda Crease this morning and he was pleased with his progress and has said he would be ordering him some lymphedema pumps. Other than that the patient is coming to change his Unna's boots twice a week and he is doing well with this. 11/27/2014 -- over Saturday and Sunday he started having more swelling in his leg and some pain and I believe his own Unna's boot caused him some discomfort and excoriation of skin. He does admit he took some more juices orally but he has continued his diuretic. 12/11/2014 -- he has completed his course of antibiotics and has had no fresh issues. He has had no pain no swelling and is managing well with his blood sugars and diuretics. 12/14/2014 between Monday and today he has had some dietary indiscretions and had some [place] food which may have made him retain a lot of fluid. His edema has increased markedly and a lot of more excoriation. 12/25/2014 -- This week he has really done well and his edema is under control and these had no pain or problems. His leg is feeling good. 01/04/2015 -- He has done great along the way and he has managed to keep his wrap for almost her entire week. No issues or problems. 02/01/2015 -- he is doing fine and easier for his first application of the Apligraf skin substitute. 02/08/2015 -- he had a lot of drainage to the dressing over the Apligraf  and this will be addressed today. 02/15/2015 -- his wound looks very good and there was not much of drainage. He will have a second application of Apligraf. 02/22/2015 -- his Apligraf is in place and he has significant drainage. 02/27/2015 -- he is here for his third application of Apligraf 03/06/2015 -- he is doing well and is here for a wound check. He did have significant drainage this week too. 03/15/2015 -- he has done better with his wound and not had any reaction to the Profore 4-layer wrap. He does feel however that a small lace opened where the Steri-Strip was. He does not have an Apligraf ordered for him today. 03/21/2015 -- he is done fine and is here for his fourth application of Apligraf. 03/27/2015 -- he is here for a wound check and other than that has been tolerating his Profore 4-layer wrap. 04/05/2015 -- he has done fine and is here for his fifth application of Apligraf Electronic Signature(s) Signed: 04/12/2015 12:01:23 PM By: Evlyn Kanner MD, FACS Entered By: Evlyn Kanner on 04/12/2015 12:01:23 Maurice Sanders ([id number]) -------------------------------------------------------------------------------- Physical Exam Details Patient Name: Maurice Sanders Date of Service: 04/12/2015 11:30 AM Medical Record Number: [id number] Patient Account Number: [id number] Date of Birth/Sex: 08-26-1945 (69 y.o. Male) Treating RN: Curtis Sites Primary Care Physician: Beverely Risen Other Clinician: Referring Physician: Beverely Risen Treating Physician/Extender: Rudene Re in Treatment: 39 Constitutional . Pulse regular. Respirations normal and unlabored. Afebrile. . Eyes Nonicteric. Reactive to light.  Ears, Nose, Mouth, and Throat Lips, teeth, and gums WNL.Marland Kitchen Moist mucosa without lesions . Neck supple and nontender. No palpable supraclavicular or cervical adenopathy. Normal sized without goiter. Respiratory WNL. No retractions.. Cardiovascular Pedal Pulses WNL. No clubbing,  cyanosis or edema. Lymphatic No adneopathy. No adenopathy. No adenopathy. Musculoskeletal Adexa without tenderness or enlargement.. Digits and nails w/o clubbing, cyanosis, infection, petechiae, ischemia, or inflammatory conditions.. Integumentary (Hair, Skin) No suspicious lesions. No crepitus or fluctuance. No peri-wound warmth or erythema. No masses.Marland Kitchen Psychiatric Judgement and insight Intact.. No evidence of depression, anxiety, or agitation.. Notes the Apligraf is in situ and the rest of the wound looks good and there is no cellulitis or edema. Electronic Signature(s) Signed: 04/12/2015 12:01:53 PM By: Evlyn Kanner MD, FACS Entered By: Evlyn Kanner on 04/12/2015 12:01:52 Maurice Sanders ([id number]) -------------------------------------------------------------------------------- Physician Orders Details Patient Name: Maurice Sanders Date of Service: 04/12/2015 11:30 AM Medical Record Number: [id number] Patient Account Number: [id number] Date of Birth/Sex: [birth year] (69 y.o. Male) Treating RN: Curtis Sites Primary Care Physician: Beverely Risen Other Clinician: Referring Physician: Beverely Risen Treating Physician/Extender: Rudene Re in Treatment: 94 Verbal / Phone Orders: Yes Clinician: Curtis Sites Read Back and Verified: Yes Diagnosis Coding Wound Cleansing Wound #2 Right,Proximal,Medial Malleolus o Clean wound with Normal Saline. Primary Wound Dressing Wound #2 Right,Proximal,Medial Malleolus o Other: - Mepitel and steri strips left intact covering Apligraf Secondary Dressing Wound #2 Right,Proximal,Medial Malleolus o ABD pad o Drawtex Dressing Change Frequency Wound #2 Right,Proximal,Medial Malleolus o Change dressing every week Follow-up Appointments Wound #2 Right,Proximal,Medial Malleolus o Return Appointment in 1 week. Edema Control Wound #2 Right,Proximal,Medial Malleolus o 4-Layer Compression System - Right Lower Extremity Electronic  Signature(s) Signed: 04/12/2015 4:23:40 PM By: Evlyn Kanner MD, FACS Signed: 04/12/2015 5:13:06 PM By: Curtis Sites Entered By: Curtis Sites on 04/12/2015 12:03:57 Maurice Sanders ([id number]) -------------------------------------------------------------------------------- Problem List Details Patient Name: Maurice Sanders Date of Service: 04/12/2015 11:30 AM Medical Record Number: [id number] Patient Account Number: [id number] Date of Birth/Sex: July 15, 1946 (69 y.o. 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 Male) Treating RN: Curtis Sites Primary Care Physician: Beverely Risen Other Clinician: Referring Physician: Beverely Risen Treating Physician/Extender: Rudene Re in Treatment: 39 Subjective Chief Complaint Information obtained from Patient Preet has been coming for dressing changes twice a week, on Monday and Thursday. Over all he is doing fine he has his blood sugar under control and is managing his diuretics well. after changing over from calamine  to zinc oxide he said his own Unna's boot still gave him a bit of itching and problems and he had significant swelling which made the Unna's boot fairly tight. History of Present Illness (HPI) The following HPI elements were documented for the patient's wound: Location: right lower extremity Quality: Patient reports experiencing a dull pain to affected area(s). Severity: patient denies any severe pain. Duration:  he has had this wound for a few weeks now. Timing: Pain in wound is Intermittent (comes and goes Context: the patient has a long history of venous insufficiency. He has been very compliant with wearing his compression stockings. Modifying Factors: the patient normally wears compression stockings. Recently he had placed a fair amount of Vaseline which cause skin irritation. Associated Signs and Symptoms: Patient denies any fevers. He has had a moderate amount of drainage. The patient is 69 year-old male with a history of diabetes who presents with right lower extremity wound. He first presented to our clinic on 07/10/2014. He does have a history of venous insufficiency in the past. He has been very compliant with wearing his compression stockings. He recently Vaseline on his legs which caused skin irritation and breakdown. The patient returns for follow-up today. he denies any fevers at home. However due to the fact that he had stopped his diuretic he has had a lot of edema of the lower extremity. He also has a lot of scaly skin on his right lower extremity. 10/02/14 -- he has resumed his diuretics on a regular basis and has been walking around adequately and has overall improved. No pain or no discharge from the wounds. 10/09/14 -- the patient has been having some pain at night while sleeping and was requesting pain medications. He also says that due to taking his diuretics he's been getting some cramps and I have asked him to please see his PCP regarding both these problems. Also  awaiting insurance clearance regarding his Apligraf. 10/16/2014. Vascular note reviewed in detail. I have been able to review his notes from Dr. Gilda Crease on 08/17/2014. 1.The venous duplex study done on 08/17/2014 showed that all veins visualized showed no evidence of DVT. Maurice Sanders, Maurice Sanders ([id number]) 2. no incompetence of the greater small saphenous veins bilaterally. The plan was that he had no surgery or intervention needed at this point. The long discussion had showed that the patient had chronic skin changes accompanied by venous insufficiency and at this stage he would benefit from wearing graduated compression stockings of the 20-30 mmHg on a daily basis and review would be done in his office back again in 6 months. At that time the patient could have a lymph pump. 10/23/14 -- Culture report --- His wound culture has grown Citrobacter koseri and Enterococcus faecalis. Final report is back but so far sensitive to Bactrim and ampicillin and ciprofloxacin. Bactrim DS was called in for 14 days. 11/13/2014 last week we changed his Unna boot on Monday and Thursday and this has helped him a lot. The edema has been much better the Unna boot hasn't slipped down and he has not had any skin abrasions. 11/20/2014 -- he saw the vascular surgeon Dr. Gilda Crease this morning and he was pleased with his progress and has said he would be ordering him some lymphedema pumps. Other than that the patient is coming to change his Unna's boots twice a week and he is doing well with this. 11/27/2014 -- over Saturday and Sunday he started having more swelling in his leg and some pain and I believe his own Unna's boot caused him some discomfort and excoriation of skin. He does admit he took some more juices orally but he has continued his diuretic. 12/11/2014 -- he has completed his course of antibiotics and has had no fresh issues. He has had no pain no swelling and is managing well with his blood sugars and  diuretics. 12/14/2014 between Monday and today he  has had some dietary indiscretions and had some [place] food which may have made him retain a lot of fluid. His edema has increased markedly and a lot of more excoriation. 12/25/2014 -- This week he has really done well and his edema is under control and these had no pain or problems. His leg is feeling good. 01/04/2015 -- He has done great along the way and he has managed to keep his wrap for almost her entire week. No issues or problems. 02/01/2015 -- he is doing fine and easier for his first application of the Apligraf skin substitute. 02/08/2015 -- he had a lot of drainage to the dressing over the Apligraf and this will be addressed today. 02/15/2015 -- his wound looks very good and there was not much of drainage. He will have a second application of Apligraf. 02/22/2015 -- his Apligraf is in place and he has significant drainage. 02/27/2015 -- he is here for his third application of Apligraf 03/06/2015 -- he is doing well and is here for a wound check. He did have significant drainage this week too. 03/15/2015 -- he has done better with his wound and not had any reaction to the Profore 4-layer wrap. He does feel however that a small lace opened where the Steri-Strip was. He does not have an Apligraf ordered for him today. 03/21/2015 -- he is done fine and is here for his fourth application of Apligraf. 03/27/2015 -- he is here for a wound check and other than that has been tolerating his Profore 4-layer wrap. 04/05/2015 -- he has done fine and is here for his fifth application of Apligraf Maurice Sanders, Maurice Sanders ([id number]) Objective Constitutional Pulse regular. Respirations normal and unlabored. Afebrile. Vitals Time Taken: 11:36 AM, Height: 71 in, Weight: 291.7 lbs, BMI: 40.7, Temperature: 98.0 F, Pulse: 80 bpm, Respiratory Rate: 18 breaths/min, Blood Pressure: 141/58 mmHg. Eyes Nonicteric. Reactive to light. Ears, Nose, Mouth, and  Throat Lips, teeth, and gums WNL.Marland Kitchen Moist mucosa without lesions . Neck supple and nontender. No palpable supraclavicular or cervical adenopathy. Normal sized without goiter. Respiratory WNL. No retractions.. Cardiovascular Pedal Pulses WNL. No clubbing, cyanosis or edema. Lymphatic No adneopathy. No adenopathy. No adenopathy. Musculoskeletal Adexa without tenderness or enlargement.. Digits and nails w/o clubbing, cyanosis, infection, petechiae, ischemia, or inflammatory conditions.Marland Kitchen Psychiatric Judgement and insight Intact.. No evidence of depression, anxiety, or agitation.. General Notes: the Apligraf is in situ and the rest of the wound looks good and there is no cellulitis or edema. Integumentary (Hair, Skin) No suspicious lesions. No crepitus or fluctuance. No peri-wound warmth or erythema. 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 He will come back to see me next week and we will continue to treat the wound. Plan Wound Cleansing: Wound #2 Right,Proximal,Medial Malleolus: Clean wound with Normal Saline. Primary Wound Dressing: Wound #2 Right,Proximal,Medial Malleolus: Other: - Mepitel and steri strips left intact covering Apligraf Secondary Dressing: Wound #2 Right,Proximal,Medial Malleolus: ABD pad Drawtex Dressing Change Frequency: Wound #2 Right,Proximal,Medial Malleolus: Change dressing every week Follow-up Appointments: Wound #2 Right,Proximal,Medial Malleolus: Return Appointment in 1 week. Edema Control: Wound #2 Right,Proximal,Medial Malleolus: 4-Layer Compression System - Right Lower Extremity He has his Apligraf in situ  and we will  change his dressing today and apply a 4-layer Profore. He will come back to see me next week and we will continue to treat the wound. Maurice Sanders, Maurice Sanders ([id number]) Electronic Signature(s) Signed: 04/13/2015 4:31:51 PM By: Evlyn Kanner MD, FACS Previous Signature: 04/12/2015 12:02:33 PM Version By: Evlyn Kanner MD, FACS Entered By: Evlyn Kanner on 04/13/2015 16:31:51 Maurice Sanders ([id number]) -------------------------------------------------------------------------------- SuperBill Details Patient Name: Maurice Sanders Date of Service: 04/12/2015 Medical Record Number: [id number] Patient Account Number: [id number] Date of Birth/Sex: 1945/12/31 (69 y.o. 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 ulcer of ankle Facility Procedures CPT4: Description Modifier Quantity Code [id number] (Facility Use Only) [id number] - APPLY MULTLAY COMPRS LWR RT 1 LEG Physician Procedures CPT4 Code Description: 4132440 99213 - WC PHYS LEVEL 3 - EST PT ICD-10 Description Diagnosis 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

## 2015-04-13 NOTE — Progress Notes (Signed)
CLAUDE, WALDMAN ([id number]) Visit Report for 04/12/2015 Arrival Information Details Patient Name: Maurice Sanders, Maurice Sanders Date of Service: 04/12/2015 11:30 AM Medical Record Number: [id number] Patient Account Number: [id number] Date of Birth/Sex: Apr 13, 1946 (69 y.o. Male) Treating RN: Curtis Sites Primary Care Physician: Beverely Risen Other Clinician: Referring Physician: Beverely Risen Treating Physician/Extender: Rudene Re in Treatment: 44 Visit Information History Since Last Visit Added or deleted any medications: No Patient Arrived: Ambulatory Any new allergies or adverse reactions: No Arrival Time: 11:34 Had a fall or experienced change in No Accompanied By: self activities of daily living that may affect Transfer Assistance: None risk of falls: Patient Identification Verified: Yes Signs or symptoms of abuse/neglect since last No Secondary Verification Process Yes visito Completed: Hospitalized since last visit: No Patient Requires Transmission- No Pain Present Now: No Based Precautions: Patient Has Alerts: Yes Patient Alerts: Patient on Blood Thinner DM II ABI L 1.06; R 1.13 Electronic Signature(s) Signed: 04/12/2015 5:13:06 PM By: Curtis Sites Entered By: Curtis Sites on 04/12/2015 11:35:07 Maurice Sanders ([id number]) -------------------------------------------------------------------------------- Encounter Discharge Information Details Patient Name: Maurice Sanders Date of Service: 04/12/2015 11:30 AM Medical Record Number: [id number] Patient Account Number: [id number] Date of Birth/Sex: May 27, 1946 (69 y.o. Male) Treating RN: Curtis Sites Primary Care Physician: Beverely Risen Other Clinician: Referring Physician: Beverely Risen Treating Physician/Extender: Rudene Re in Treatment: 70 Encounter Discharge Information Items Discharge Pain Level: 0 Discharge Condition: Stable Ambulatory Status: Ambulatory Discharge Destination: Home Transportation: Private  Auto Accompanied By: self Schedule Follow-up Appointment: Yes Medication Reconciliation completed and provided to Patient/Care No Anslie Spadafora: Provided on Clinical Summary of Care: 04/12/2015 Form Type Recipient Paper Patient AS Electronic Signature(s) Signed: 04/12/2015 12:08:11 PM By: Gwenlyn Perking Entered By: Gwenlyn Perking on 04/12/2015 12:08:11 Maurice Sanders ([id number]) -------------------------------------------------------------------------------- Lower Extremity Assessment Details Patient Name: Maurice Sanders Date of Service: 04/12/2015 11:30 AM Medical Record Number: [id number] Patient Account Number: [id number] Date of Birth/Sex: 1946/03/30 (69 y.o. Male) Treating RN: Curtis Sites Primary Care Physician: Beverely Risen Other Clinician: Referring Physician: Beverely Risen Treating Physician/Extender: Rudene Re in Treatment: 39 Edema Assessment Assessed: [Left: No] [Right: No] Edema: [Left: Ye] [Right: s] Calf Left: Right: Point of Measurement: 33 cm From Medial Instep cm 36 cm Ankle Left: Right: Point of Measurement: 11 cm From Medial Instep cm 26.8 cm Vascular Assessment Pulses: Posterior Tibial Dorsalis Pedis Palpable: [Right:Yes] Extremity colors, hair growth, and conditions: Extremity Color: [Right:Hyperpigmented] Hair Growth on Extremity: [Right:No] Temperature of Extremity: [Right:Warm] Capillary Refill: [Right:< 3 seconds] Toe Nail Assessment Left: Right: Thick: Yes Discolored: Yes Deformed: Yes Improper Length and Hygiene: No Electronic Signature(s) Signed: 04/12/2015 5:13:06 PM By: Curtis Sites Entered By: Curtis Sites on 04/12/2015 11:48:59 Maurice Sanders ([id number]) -------------------------------------------------------------------------------- Multi Wound Chart Details Patient Name: Maurice Sanders Date of Service: 04/12/2015 11:30 AM Medical Record Number: [id number] Patient Account Number: [id number] Date of Birth/Sex: 01/17/1946 (69 y.o.  Male) Treating RN: Curtis Sites Primary Care Physician: Beverely Risen Other Clinician: Referring Physician: Beverely Risen Treating Physician/Extender: Rudene Re in Treatment: 39 Vital Signs Height(in): 71 Pulse(bpm): 80 Weight(lbs): 291.7 Blood Pressure 141/58 (mmHg): Body Mass Index(BMI): 41 Temperature(F): 98.0 Respiratory Rate 18 (breaths/min): Wound Assessments Treatment Notes Electronic Signature(s) Signed: 04/12/2015 5:13:06 PM By: Curtis Sites Entered By: Curtis Sites on 04/12/2015 11:49:23 Maurice Sanders ([id number]) -------------------------------------------------------------------------------- Multi-Disciplinary Care Plan Details Patient Name: Maurice Sanders Date of Service: 04/12/2015 11:30 AM Medical Record Number: [id number] Patient Account Number: [id number] Date of Birth/Sex: 02/28/1946 (68 y.o. Male) Treating RN: Curtis Sites Primary Care Physician: Beverely Risen Other Clinician: Referring Physician:  Beverely Risen Treating Physician/Extender: Rudene Re in Treatment: 54 Active Inactive Abuse / Safety / Falls / Self Care Management Nursing Diagnoses: Potential for falls Goals: Patient will remain injury free Date Initiated: 07/10/2014 Goal Status: Active Interventions: Assess fall risk on admission and as needed Notes: Nutrition Nursing Diagnoses: Potential for alteratiion in Nutrition/Potential for imbalanced nutrition Goals: Patient/caregiver agrees to and verbalizes understanding of need to use nutritional supplements and/or vitamins as prescribed Date Initiated: 07/10/2014 Goal Status: Active Interventions: Assess patient nutrition upon admission and as needed per policy Notes: Orientation to the Wound Care Program Nursing Diagnoses: Knowledge deficit related to the wound healing center program Goals: Patient/caregiver will verbalize understanding of the Wound Healing Center Program [place], [place] ([id number]) Date  Initiated: 07/10/2014 Goal Status: Active Interventions: Provide education on orientation to the wound center Notes: Wound/Skin Impairment Nursing Diagnoses: Impaired tissue integrity Goals: Ulcer/skin breakdown will have a volume reduction of 30% by week 4 Date Initiated: 07/10/2014 Goal Status: Active Interventions: Assess ulceration(s) every visit Notes: Electronic Signature(s) Signed: 04/12/2015 5:13:06 PM By: Curtis Sites Entered By: Curtis Sites on 04/12/2015 11:49:16 Maurice Sanders ([id number]) -------------------------------------------------------------------------------- Patient/Caregiver Education Details Patient Name: Maurice Sanders Date of Service: 04/12/2015 11:30 AM Medical Record Number: [id number] Patient Account Number: [id number] Date of Birth/Gender: August 13, 1945 (69 y.o. Male) Treating RN: Curtis Sites Primary Care Physician: Beverely Risen Other Clinician: Referring Physician: Beverely Risen Treating Physician/Extender: Rudene Re in Treatment: 39 Education Assessment Education Provided To: Patient Education Topics Provided Venous: Handouts: Other: continue compression in left leg Methods: Explain/Verbal Responses: State content correctly Electronic Signature(s) Signed: 04/12/2015 5:13:06 PM By: Curtis Sites Entered By: Curtis Sites on 04/12/2015 12:06:28 Maurice Sanders ([id number]) -------------------------------------------------------------------------------- Vitals Details Patient Name: Maurice Sanders Date of Service: 04/12/2015 11:30 AM Medical Record Number: [id number] Patient Account Number: [id number] Date of Birth/Sex: [birth year] (69 y.o. Male) Treating RN: Curtis Sites Primary Care Physician: Beverely Risen Other Clinician: Referring Physician: Beverely Risen Treating Physician/Extender: Rudene Re in Treatment: 39 Vital Signs Time Taken: 11:36 Temperature (F): 98.0 Height (in): 71 Pulse (bpm): 80 Weight (lbs):  291.7 Respiratory Rate (breaths/min): 18 Body Mass Index (BMI): 40.7 Blood Pressure (mmHg): 141/58 Reference Range: 80 - 120 mg / dl Electronic Signature(s) Signed: 04/12/2015 5:13:06 PM By: Curtis Sites Entered By: Curtis Sites on 04/12/2015 11:37:29

## 2015-04-19 ENCOUNTER — Encounter: Payer: Medicare Other | Admitting: Surgery

## 2015-04-19 DIAGNOSIS — L97311 Non-pressure chronic ulcer of right ankle limited to breakdown of skin: Secondary | ICD-10-CM | POA: Diagnosis not present

## 2015-04-19 DIAGNOSIS — E11620 Type 2 diabetes mellitus with diabetic dermatitis: Secondary | ICD-10-CM | POA: Diagnosis not present

## 2015-04-19 DIAGNOSIS — I83013 Varicose veins of right lower extremity with ulcer of ankle: Secondary | ICD-10-CM | POA: Diagnosis not present

## 2015-04-19 DIAGNOSIS — R600 Localized edema: Secondary | ICD-10-CM | POA: Diagnosis not present

## 2015-04-19 DIAGNOSIS — I87311 Chronic venous hypertension (idiopathic) with ulcer of right lower extremity: Secondary | ICD-10-CM | POA: Diagnosis not present

## 2015-04-20 NOTE — Progress Notes (Signed)
Maurice Sanders, Maurice Sanders ([id number]) Visit Report for 04/19/2015 Arrival Information Details Patient Name: Maurice Sanders, Maurice Sanders Date of Service: 04/19/2015 11:30 AM Medical Record Number: [id number] Patient Account Number: [id number] Date of Birth/Sex: January 11, 1946 (69 y.o. Male) Treating RN: Curtis Sites Primary Care Physician: Beverely Risen Other Clinician: Referring Physician: Beverely Risen Treating Physician/Extender: Rudene Re in Treatment: 40 Visit Information History Since Last Visit Added or deleted any medications: No Patient Arrived: Ambulatory Any new allergies or adverse reactions: No Arrival Time: 11:35 Had a fall or experienced change in No Accompanied By: self activities of daily living that may affect Transfer Assistance: None risk of falls: Patient Identification Verified: Yes Signs or symptoms of abuse/neglect since last No Secondary Verification Process Yes visito Completed: Hospitalized since last visit: No Patient Requires Transmission- No Pain Present Now: No Based Precautions: Patient Has Alerts: Yes Patient Alerts: Patient on Blood Thinner DM II ABI L 1.06; R 1.13 Electronic Signature(s) Signed: 04/19/2015 6:07:15 PM By: Curtis Sites Entered By: Curtis Sites on 04/19/2015 11:35:33 Maurice Sanders ([id number]) -------------------------------------------------------------------------------- Encounter Discharge Information Details Patient Name: Maurice Sanders Date of Service: 04/19/2015 11:30 AM Medical Record Number: [id number] Patient Account Number: [id number] Date of Birth/Sex: 1946-02-19 (69 y.o. Male) Treating RN: Curtis Sites Primary Care Physician: Beverely Risen Other Clinician: Referring Physician: Beverely Risen Treating Physician/Extender: Rudene Re in Treatment: 14 Encounter Discharge Information Items Discharge Pain Level: 0 Discharge Condition: Stable Ambulatory Status: Ambulatory Discharge Destination:  Home Private Transportation: Auto Accompanied By: self Schedule Follow-up Appointment: Yes Medication Reconciliation completed and No provided to Patient/Care Maurice Sanders: Clinical Summary of Care: Electronic Signature(s) Signed: 04/19/2015 6:07:15 PM By: Curtis Sites Entered By: Curtis Sites on 04/19/2015 12:13:51 Maurice Sanders ([id number]) -------------------------------------------------------------------------------- Lower Extremity Assessment Details Patient Name: Maurice Sanders Date of Service: 04/19/2015 11:30 AM Medical Record Number: [id number] Patient Account Number: [id number] Date of Birth/Sex: [birth year] (69 y.o. Male) Treating RN: Curtis Sites Primary Care Physician: Beverely Risen Other Clinician: Referring Physician: Beverely Risen Treating Physician/Extender: Rudene Re in Treatment: 40 Edema Assessment Assessed: [Left: No] [Right: No] E[Left: dema] [Right: :] Calf Left: Right: Point of Measurement: 33 cm From Medial Instep cm 36.5 cm Ankle Left: Right: Point of Measurement: 11 cm From Medial Instep cm 25.6 cm Vascular Assessment Pulses: Posterior Tibial Dorsalis Pedis Palpable: [Right:Yes] Extremity colors, hair growth, and conditions: Extremity Color: [Right:Hyperpigmented] Hair Growth on Extremity: [Right:No] Temperature of Extremity: [Right:Warm] Capillary Refill: [Right:< 3 seconds] Toe Nail Assessment Left: Right: Thick: Yes Discolored: Yes Deformed: Yes Improper Length and Hygiene: No Electronic Signature(s) Signed: 04/19/2015 6:07:15 PM By: Curtis Sites Entered By: Curtis Sites on 04/19/2015 11:45:48 Maurice Sanders ([id number]) -------------------------------------------------------------------------------- Multi Wound Chart Details Patient Name: Maurice Sanders Date of Service: 04/19/2015 11:30 AM Medical Record Number: [id number] Patient Account Number: [id number] Date of Birth/Sex: Jul 12, 1946 (69 y.o. 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] [N/A:N/A] Comorbid History: [2:Sleep Apnea, Congestive Heart Failure, Deep Vein Thrombosis, Hypertension, Type II Diabetes, Gout] [N/A:N/A] Date Acquired: [2:07/31/2014] [N/A:N/A] Weeks of Treatment: [2:37] [N/A:N/A] Wound Status: [2:Open] [N/A:N/A] Measurements L x W x D 4.2x2.7x0.2 [N/A:N/A] (cm) Area (cm) : [2:8.906] [N/A:N/A] Volume (cm) : [2:1.781] [N/A:N/A] % Reduction in Area: [2:-1159.70%] [  N/A:N/A] % Reduction in Volume: -2408.50% [N/A:N/A] Classification: [2:Full Thickness Without Exposed Support Structures] [N/A:N/A] HBO Classification: [2:Grade 1] [N/A:N/A] Exudate Amount: [2:Large] [N/A:N/A] Exudate Type: [2:Serous] [N/A:N/A] Exudate Color: [2:amber] [N/A:N/A] Wound Margin: [2:Indistinct, nonvisible] [N/A:N/A] Granulation Amount: [2:Medium (34-66%)] [N/A:N/A] Granulation Quality: [2:Red] [N/A:N/A] Necrotic Amount: [2:Medium (34-66%)] [N/A:N/A] Exposed Structures: Fascia: No N/A N/A Fat: No Tendon: No Muscle: No Joint: No Bone: No Limited to Skin Breakdown Epithelialization: Medium (34-66%) N/A N/A Periwound Skin Texture: Edema: Yes N/A N/A Scarring: Yes Excoriation: No Induration: No Callus: No Crepitus: No Fluctuance: No Friable: No Rash: No Periwound Skin Maceration: Yes N/A N/A Moisture: Moist: Yes Dry/Scaly: No Periwound Skin Color: Hemosiderin Staining: Yes N/A N/A Atrophie Blanche: No Cyanosis: No Ecchymosis:  No Erythema: No Mottled: No Pallor: No Rubor: No Tenderness on No N/A N/A Palpation: Wound Preparation: Ulcer Cleansing: Other: N/A N/A soap and water Topical Anesthetic Applied: Other: lidocaine 4% Treatment Notes Electronic Signature(s) Signed: 04/19/2015 6:07:15 PM By: Curtis Sites Entered By: Curtis Sites on 04/19/2015 11:52:56 Maurice Sanders ([id number]) -------------------------------------------------------------------------------- Multi-Disciplinary Care Plan Details Patient Name: Maurice Sanders Date of Service: 04/19/2015 11:30 AM Medical Record Number: [id number] Patient Account Number: [id number] Date of Birth/Sex: May 16, 1946 (69 y.o. Male) Treating RN: Curtis Sites Primary Care Physician: Beverely Risen Other Clinician: Referring Physician: Beverely Risen Treating Physician/Extender: Rudene Re in Treatment: 23 Active Inactive Abuse / Safety / Falls / Self Care Management Nursing Diagnoses: Potential for falls Goals: Patient will remain injury free Date Initiated: 07/10/2014 Goal Status: Active Interventions: Assess fall risk on admission and as needed Notes: Nutrition Nursing Diagnoses: Potential for alteratiion in Nutrition/Potential for imbalanced nutrition Goals: Patient/caregiver agrees to and verbalizes understanding of need to use nutritional supplements and/or vitamins as prescribed Date Initiated: 07/10/2014 Goal Status: Active Interventions: Assess patient nutrition upon admission and as needed per policy Notes: Orientation to the Wound Care Program Nursing Diagnoses: Knowledge deficit related to the wound healing center program Goals: Patient/caregiver will verbalize understanding of the Wound Healing Center Program [place], [place] ([id number]) Date Initiated: 07/10/2014 Goal Status: Active Interventions: Provide education on orientation to the wound center Notes: Wound/Skin Impairment Nursing Diagnoses: Impaired tissue  integrity Goals: Ulcer/skin breakdown will have a volume reduction of 30% by week 4 Date Initiated: 07/10/2014 Goal Status: Active Interventions: Assess ulceration(s) every visit Notes: Electronic Signature(s) Signed: 04/19/2015 6:07:15 PM By: Curtis Sites Entered By: Curtis Sites on 04/19/2015 11:52:48 Maurice Sanders ([id number]) -------------------------------------------------------------------------------- Patient/Caregiver Education Details Patient Name: Maurice Sanders Date of Service: 04/19/2015 11:30 AM Medical Record Number: [id number] Patient Account Number: [id number] Date of Birth/Gender: [birth year] (69 y.o. Male) Treating RN: Curtis Sites Primary Care Physician: Beverely Risen Other Clinician: Referring Physician: Beverely Risen Treating Physician/Extender: Rudene Re in Treatment: 40 Education Assessment Education Provided To: Patient Education Topics Provided Venous: Handouts: Other: please come in for rewrap if needed Methods: Explain/Verbal Responses: State content correctly Electronic Signature(s) Signed: 04/19/2015 6:07:15 PM By: Curtis Sites Entered By: Curtis Sites on 04/19/2015 12:14:33 Maurice Sanders ([id number]) -------------------------------------------------------------------------------- Wound Assessment Details Patient Name: Maurice Sanders Date of Service: 04/19/2015 11:30 AM Medical Record Number: [id number] Patient Account Number: [id number] Date of Birth/Sex: 04/12/1946 (69 y.o. Male) Treating RN: Curtis Sites Primary Care Physician: Beverely Risen Other Clinician: Referring Physician: Beverely Risen Treating Physician/Extender: Rudene Re in Treatment: 40 Wound Status Wound Number: 2 Primary Venous Leg Ulcer Etiology: Wound Location: Right Malleolus - Medial, Proximal Wound Open Status: Wounding Event: Gradually Appeared Comorbid Sleep Apnea, Congestive Heart Failure, Date Acquired: 07/31/2014 History: Deep Vein  Thrombosis, Hypertension,  Weeks Of Treatment: 37 Type II Diabetes, Gout Clustered Wound: No Photos Photo Uploaded By: Curtis Sites on 04/19/2015 12:33:24 Wound Measurements Length: (cm) 4.2 Width: (cm) 2.7 Depth: (cm) 0.2 Area: (cm) 8.906 Volume: (cm) 1.781 % Reduction in Area: -1159.7% % Reduction in Volume: -2408.5% Epithelialization: Medium (34-66%) Tunneling: No Undermining: No Wound Description Full Thickness Without Exposed Classification: Support Structures Diabetic Severity Grade 1 (Wagner): Wound Margin: Indistinct, nonvisible Exudate Amount: Large Exudate Type: Serous Exudate Color: amber Wound Bed Granulation Amount: Medium (34-66%) Exposed Structure Sanders, Maurice ([id number]) Granulation Quality: Red Fascia Exposed: No Necrotic Amount: Medium (34-66%) Fat Layer Exposed: No Necrotic Quality: Adherent Slough Tendon Exposed: No Muscle Exposed: No Joint Exposed: No Bone Exposed: No Limited to Skin Breakdown Periwound Skin Texture Texture Color No Abnormalities Noted: No No Abnormalities Noted: No Callus: No Atrophie Blanche: No Crepitus: No Cyanosis: No Excoriation: No Ecchymosis: No Fluctuance: No Erythema: No Friable: No Hemosiderin Staining: Yes Induration: No Mottled: No Localized Edema: Yes Pallor: No Rash: No Rubor: No Scarring: Yes Moisture No Abnormalities Noted: No Dry / Scaly: No Maceration: Yes Moist: Yes Wound Preparation Ulcer Cleansing: Other: soap and water, Topical Anesthetic Applied: Other: lidocaine 4%, Treatment Notes Wound #2 (Right, Proximal, Medial Malleolus) 1. Cleansed with: Cleanse wound with antibacterial soap and water 2. Anesthetic Topical Lidocaine 4% cream to wound bed prior to debridement 4. Dressing Applied: Other dressing (specify in notes) 7. Secured with 4-Layer Compression System - Right Lower Extremity Notes drawtex, xtrasorb Electronic Signature(s) Signed: 04/19/2015 6:07:15 PM By:  Curtis Sites Entered By: Curtis Sites on 04/19/2015 11:52:15 Maurice Sanders, Maurice Sanders ([id number]) Maurice Sanders, Maurice Sanders ([id number]) -------------------------------------------------------------------------------- Vitals Details Patient Name: Maurice Sanders Date of Service: 04/19/2015 11:30 AM Medical Record Number: [id number] Patient Account Number: [id number] Date of Birth/Sex: 01/15/1946 (69 y.o. Male) Treating RN: Curtis Sites Primary Care Physician: Beverely Risen Other Clinician: Referring Physician: Beverely Risen Treating Physician/Extender: Rudene Re in Treatment: 40 Vital Signs Time Taken: 11:36 Temperature (F): 98.1 Height (in): 71 Pulse (bpm): 84 Weight (lbs): 291.7 Respiratory Rate (breaths/min): 18 Body Mass Index (BMI): 40.7 Blood Pressure (mmHg): 146/62 Reference Range: 80 - 120 mg / dl Electronic Signature(s) Signed: 04/19/2015 6:07:15 PM By: Curtis Sites Entered By: Curtis Sites on 04/19/2015 11:37:41

## 2015-04-20 NOTE — Progress Notes (Signed)
JOSAFAT, ENRICO ([id number]) Visit Report for 04/19/2015 Chief Complaint Document Details Patient Name: NAJEE, MANNINEN Date of Service: 04/19/2015 11:30 AM Medical Record Number: [id number] Patient Account Number: [id number] Date of Birth/Sex: Jul 14, 1946 (69 y.o. Male) Treating RN: Curtis Sites Primary Care Physician: Beverely Risen Other Clinician: Referring Physician: Beverely Risen Treating Physician/Extender: Rudene Re in Treatment: 78 Information Obtained from: Patient Chief Complaint Zymir has been coming for dressing changes twice a week, on Monday and Thursday. Over all he is doing fine he has his blood sugar under control and is managing his diuretics well. after changing over from calamine to zinc oxide he said his own Unna's boot still gave him a bit of itching and problems and he had significant swelling which made the Unna's boot fairly tight. Electronic Signature(s) Signed: 04/19/2015 12:04:34 PM By: Evlyn Kanner MD, FACS Entered By: Evlyn Kanner on 04/19/2015 12:04:33 [name] ([id number]) -------------------------------------------------------------------------------- HPI Details Patient Name: [name] Date of Service: 04/19/2015 11:30 AM Medical Record Number: [id number] Patient Account Number: [id number] Date of Birth/Sex: [birth year] (69 y.o. Male) Treating RN: Curtis Sites Primary Care Physician: Beverely Risen Other Clinician: Referring Physician: Beverely Risen Treating Physician/Extender: Rudene Re in Treatment: 40 History of Present Illness Location: right lower extremity Quality: Patient reports experiencing a dull pain to affected area(s). Severity: patient denies any severe pain. Duration: he has had this wound for a few weeks now. Timing: Pain in wound is Intermittent (comes and goes Context: the patient has a long history of venous insufficiency. He has been very compliant with wearing his compression stockings. Modifying Factors:  the patient normally wears compression stockings. Recently he had placed a fair amount of Vaseline which cause skin irritation. Associated Signs and Symptoms: Patient denies any fevers. He has had a moderate amount of drainage. HPI Description: The patient is 69 year-old male with a history of diabetes who presents with right lower extremity wound. He first presented to our clinic on 07/10/2014. He does have a history of venous insufficiency in the past. He has been very compliant with wearing his compression stockings. He recently Vaseline on his legs which caused skin irritation and breakdown. The patient returns for follow-up today. he denies any fevers at home. However due to the fact that he had stopped his diuretic he has had a lot of edema of the lower extremity. He also has a lot of scaly skin on his right lower extremity. 10/02/14 -- he has resumed his diuretics on a regular basis and has been walking around adequately and has overall improved. No pain or no discharge from the wounds. 10/09/14 -- the patient has been having some pain at night while sleeping and was requesting pain medications. He also says that due to taking his diuretics he's been getting some cramps and I have asked him to please see his PCP regarding both these problems. Also awaiting insurance clearance regarding his Apligraf. 10/16/2014. Vascular note reviewed in detail. I have been able to review his notes from Dr. Gilda Crease on 08/17/2014. 1.The venous duplex study done on 08/17/2014 showed that all veins visualized showed no evidence of DVT. 2. no incompetence of the greater small saphenous veins bilaterally. The plan was that he had no surgery or intervention needed at this point. The long discussion had showed that the patient had chronic skin changes accompanied by venous insufficiency and at this stage he would benefit from wearing graduated compression stockings of the 20-30 mmHg on a daily basis and  review would be done in  his office back again in 6 months. At that time the patient could have a lymph pump. 10/23/14 -- Culture report --- His wound culture has grown Citrobacter koseri and Enterococcus faecalis. Final report is back but so far sensitive to Bactrim and ampicillin and ciprofloxacin. Bactrim DS was called in for 14 days. KAIYAN, LUCZAK ([id number]) 11/13/2014 last week we changed his Unna boot on Monday and Thursday and this has helped him a lot. The edema has been much better the Unna boot hasn't slipped down and he has not had any skin abrasions. 11/20/2014 -- he saw the vascular surgeon Dr. Gilda Crease this morning and he was pleased with his progress and has said he would be ordering him some lymphedema pumps. Other than that the patient is coming to change his Unna's boots twice a week and he is doing well with this. 11/27/2014 -- over Saturday and Sunday he started having more swelling in his leg and some pain and I believe his own Unna's boot caused him some discomfort and excoriation of skin. He does admit he took some more juices orally but he has continued his diuretic. 12/11/2014 -- he has completed his course of antibiotics and has had no fresh issues. He has had no pain no swelling and is managing well with his blood sugars and diuretics. 12/14/2014 between Monday and today he has had some dietary indiscretions and had some [place] food which may have made him retain a lot of fluid. His edema has increased markedly and a lot of more excoriation. 12/25/2014 -- This week he has really done well and his edema is under control and these had no pain or problems. His leg is feeling good. 01/04/2015 -- He has done great along the way and he has managed to keep his wrap for almost her entire week. No issues or problems. 02/01/2015 -- he is doing fine and easier for his first application of the Apligraf skin substitute. 02/08/2015 -- he had a lot of drainage to the dressing  over the Apligraf and this will be addressed today. 02/15/2015 -- his wound looks very good and there was not much of drainage. He will have a second application of Apligraf. 02/22/2015 -- his Apligraf is in place and he has significant drainage. 02/27/2015 -- he is here for his third application of Apligraf 03/06/2015 -- he is doing well and is here for a wound check. He did have significant drainage this week too. 03/15/2015 -- he has done better with his wound and not had any reaction to the Profore 4-layer wrap. He does feel however that a small lace opened where the Steri-Strip was. He does not have an Apligraf ordered for him today. 03/21/2015 -- he is done fine and is here for his fourth application of Apligraf. 03/27/2015 -- he is here for a wound check and other than that has been tolerating his Profore 4-layer wrap. 04/05/2015 -- he has done fine and is here for his fifth application of Apligraf Electronic Signature(s) Signed: 04/19/2015 12:04:41 PM By: Evlyn Kanner MD, FACS Entered By: Evlyn Kanner on 04/19/2015 12:04:41 [name] ([id number]) -------------------------------------------------------------------------------- Physical Exam Details Patient Name: [name] Date of Service: 04/19/2015 11:30 AM Medical Record Number: [id number] Patient Account Number: [id number] Date of Birth/Sex: [birth year] (69 y.o. Male) Treating RN: Curtis Sites Primary Care Physician: Beverely Risen Other Clinician: Referring Physician: Beverely Risen Treating Physician/Extender: Rudene Re in Treatment: 40 Constitutional . Pulse regular. Respirations normal and unlabored. Afebrile. . Eyes Nonicteric. Reactive to light.  Ears, Nose, Mouth, and Throat Lips, teeth, and gums WNL.Marland Kitchen Moist mucosa without lesions . Neck supple and nontender. No palpable supraclavicular or cervical adenopathy. Normal sized without goiter. Respiratory WNL. No retractions.. Cardiovascular Pedal Pulses  WNL. No clubbing, cyanosis or edema. Lymphatic No adneopathy. No adenopathy. No adenopathy. Musculoskeletal Adexa without tenderness or enlargement.. Digits and nails w/o clubbing, cyanosis, infection, petechiae, ischemia, or inflammatory conditions.. Integumentary (Hair, Skin) No suspicious lesions. No crepitus or fluctuance. No peri-wound warmth or erythema. No masses.Marland Kitchen Psychiatric Judgement and insight Intact.. No evidence of depression, anxiety, or agitation.. Notes the wounds look excellent and there are minimal areas which are still open and there is no slough. The leg has good edema control. Electronic Signature(s) Signed: 04/19/2015 12:05:08 PM By: Evlyn Kanner MD, FACS Entered By: Evlyn Kanner on 04/19/2015 12:05:08 [name] ([id number]) -------------------------------------------------------------------------------- Physician Orders Details Patient Name: [name] Date of Service: 04/19/2015 11:30 AM Medical Record Number: [id number] Patient Account Number: [id number] Date of Birth/Sex: 1946-04-15 (69 y.o. Male) Treating RN: Curtis Sites Primary Care Physician: Beverely Risen Other Clinician: Referring Physician: Beverely Risen Treating Physician/Extender: Rudene Re in Treatment: 78 Verbal / Phone Orders: Yes Clinician: Curtis Sites Read Back and Verified: Yes Diagnosis Coding Wound Cleansing Wound #2 Right,Proximal,Medial Malleolus o Clean wound with Normal Saline. Primary Wound Dressing Wound #2 Right,Proximal,Medial Malleolus o Drawtex Secondary Dressing Wound #2 Right,Proximal,Medial Malleolus o XtraSorb Dressing Change Frequency Wound #2 Right,Proximal,Medial Malleolus o Change dressing every week Follow-up Appointments Wound #2 Right,Proximal,Medial Malleolus o Return Appointment in 1 week. Edema Control Wound #2 Right,Proximal,Medial Malleolus o 4-Layer Compression System - Right Lower Extremity Electronic  Signature(s) Signed: 04/19/2015 4:21:04 PM By: Evlyn Kanner MD, FACS Signed: 04/19/2015 6:07:15 PM By: Curtis Sites Entered By: Curtis Sites on 04/19/2015 12:00:28 [name] ([id number]) -------------------------------------------------------------------------------- Problem List Details Patient Name: [name] Date of Service: 04/19/2015 11:30 AM Medical Record Number: [id number] Patient Account Number: [id number] Date of Birth/Sex: [birth year] (69 y.o. 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 Male) Treating RN: Curtis Sites Primary Care Physician: Beverely Risen Other Clinician: Referring Physician: Beverely Risen Treating Physician/Extender: Rudene Re in Treatment: 40 Subjective Chief Complaint Information obtained from Patient Lamoyne has been coming for dressing changes twice a week, on Monday and Thursday. Over all he is doing fine he has his blood sugar under control and is managing his diuretics well. after changing over  from calamine to zinc oxide he said his own Unna's boot still gave him a bit of itching and problems and he had significant swelling which made the Unna's boot fairly tight. History of Present Illness (HPI) The following HPI elements were documented for the patient's wound: Location: right lower extremity Quality: Patient reports experiencing a dull pain to affected area(s). Severity: patient denies any severe pain. Duration: he has had this wound for a few  weeks now. Timing: Pain in wound is Intermittent (comes and goes Context: the patient has a long history of venous insufficiency. He has been very compliant with wearing his compression stockings. Modifying Factors: the patient normally wears compression stockings. Recently he had placed a fair amount of Vaseline which cause skin irritation. Associated Signs and Symptoms: Patient denies any fevers. He has had a moderate amount of drainage. The patient is 69 year-old male with a history of diabetes who presents with right lower extremity wound. He first presented to our clinic on 07/10/2014. He does have a history of venous insufficiency in the past. He has been very compliant with wearing his compression stockings. He recently Vaseline on his legs which caused skin irritation and breakdown. The patient returns for follow-up today. he denies any fevers at home. However due to the fact that he had stopped his diuretic he has had a lot of edema of the lower extremity. He also has a lot of scaly skin on his right lower extremity. 10/02/14 -- he has resumed his diuretics on a regular basis and has been walking around adequately and has overall improved. No pain or no discharge from the wounds. 10/09/14 -- the patient has been having some pain at night while sleeping and was requesting pain medications. He also says that due to taking his diuretics he's been getting some cramps and I have asked him to please see his PCP regarding both these  problems. Also awaiting insurance clearance regarding his Apligraf. 10/16/2014. Vascular note reviewed in detail. I have been able to review his notes from Dr. Gilda Crease on 08/17/2014. 1.The venous duplex study done on 08/17/2014 showed that all veins visualized showed no evidence of DVT. DAREON, NUNZIATO ([id number]) 2. no incompetence of the greater small saphenous veins bilaterally. The plan was that he had no surgery or intervention needed at this point. The long discussion had showed that the patient had chronic skin changes accompanied by venous insufficiency and at this stage he would benefit from wearing graduated compression stockings of the 20-30 mmHg on a daily basis and review would be done in his office back again in 6 months. At that time the patient could have a lymph pump. 10/23/14 -- Culture report --- His wound culture has grown Citrobacter koseri and Enterococcus faecalis. Final report is back but so far sensitive to Bactrim and ampicillin and ciprofloxacin. Bactrim DS was called in for 14 days. 11/13/2014 last week we changed his Unna boot on Monday and Thursday and this has helped him a lot. The edema has been much better the Unna boot hasn't slipped down and he has not had any skin abrasions. 11/20/2014 -- he saw the vascular surgeon Dr. Gilda Crease this morning and he was pleased with his progress and has said he would be ordering him some lymphedema pumps. Other than that the patient is coming to change his Unna's boots twice a week and he is doing well with this. 11/27/2014 -- over Saturday and Sunday he started having more swelling in his leg and some pain and I believe his own Unna's boot caused him some discomfort and excoriation of skin. He does admit he took some more juices orally but he has continued his diuretic. 12/11/2014 -- he has completed his course of antibiotics and has had no fresh issues. He has had no pain no swelling and is managing well with his blood  sugars and diuretics. 12/14/2014 between Monday and today he has had some dietary indiscretions and had some  [place] food which may have made him retain a lot of fluid. His edema has increased markedly and a lot of more excoriation. 12/25/2014 -- This week he has really done well and his edema is under control and these had no pain or problems. His leg is feeling good. 01/04/2015 -- He has done great along the way and he has managed to keep his wrap for almost her entire week. No issues or problems. 02/01/2015 -- he is doing fine and easier for his first application of the Apligraf skin substitute. 02/08/2015 -- he had a lot of drainage to the dressing over the Apligraf and this will be addressed today. 02/15/2015 -- his wound looks very good and there was not much of drainage. He will have a second application of Apligraf. 02/22/2015 -- his Apligraf is in place and he has significant drainage. 02/27/2015 -- he is here for his third application of Apligraf 03/06/2015 -- he is doing well and is here for a wound check. He did have significant drainage this week too. 03/15/2015 -- he has done better with his wound and not had any reaction to the Profore 4-layer wrap. He does feel however that a small lace opened where the Steri-Strip was. He does not have an Apligraf ordered for him today. 03/21/2015 -- he is done fine and is here for his fourth application of Apligraf. 03/27/2015 -- he is here for a wound check and other than that has been tolerating his Profore 4-layer wrap. 04/05/2015 -- he has done fine and is here for his fifth application of Apligraf Hopwood, Milos ([id number]) Objective Constitutional Pulse regular. Respirations normal and unlabored. Afebrile. Vitals Time Taken: 11:36 AM, Height: 71 in, Weight: 291.7 lbs, BMI: 40.7, Temperature: 98.1 F, Pulse: 84 bpm, Respiratory Rate: 18 breaths/min, Blood Pressure: 146/62 mmHg. Eyes Nonicteric. Reactive to light. Ears, Nose,  Mouth, and Throat Lips, teeth, and gums WNL.Marland Kitchen Moist mucosa without lesions . Neck supple and nontender. No palpable supraclavicular or cervical adenopathy. Normal sized without goiter. Respiratory WNL. No retractions.. Cardiovascular Pedal Pulses WNL. No clubbing, cyanosis or edema. Lymphatic No adneopathy. No adenopathy. No adenopathy. Musculoskeletal Adexa without tenderness or enlargement.. Digits and nails w/o clubbing, cyanosis, infection, petechiae, ischemia, or inflammatory conditions.Marland Kitchen Psychiatric Judgement and insight Intact.. No evidence of depression, anxiety, or agitation.. General Notes: the wounds look excellent and there are minimal areas which are still open and there is no slough. The leg has good edema control. Integumentary (Hair, Skin) No suspicious lesions. No crepitus or fluctuance. No peri-wound warmth or erythema. No masses.. Wound #2 status is Open. Original cause of wound was Gradually Appeared. The wound is located on the Right,Proximal,Medial Malleolus. The wound measures 4.2cm length x 2.7cm width x 0.2cm depth; 8.906cm^2 area and 1.781cm^3 volume. The wound is limited to skin breakdown. There is no tunneling or Fanelli, Alven ([id number]) undermining noted. There is a large amount of serous drainage noted. The wound margin is indistinct and nonvisible. There is medium (34-66%) red granulation within the wound bed. There is a medium (34-66%) amount of necrotic tissue within the wound bed including Adherent Slough. The periwound skin appearance exhibited: Localized Edema, Scarring, Maceration, Moist, Hemosiderin Staining. The periwound skin appearance did not exhibit: Callus, Crepitus, Excoriation, Fluctuance, Friable, Induration, Rash, Dry/Scaly, Atrophie Blanche, Cyanosis, Ecchymosis, Mottled, Pallor, Rubor, Erythema. Assessment Active Problems ICD-10 I87.311 - Chronic venous hypertension (idiopathic) with ulcer of right lower extremity E11.620 - Type 2  diabetes mellitus with diabetic dermatitis I83.013 - Varicose veins of right  lower extremity with ulcer of ankle I have recommended a piece of Gore-Tex over the area of the right medial ankle and then his 4-layer Profore wrap. He will come back and see me next week Plan Wound Cleansing: Wound #2 Right,Proximal,Medial Malleolus: Clean wound with Normal Saline. Primary Wound Dressing: Wound #2 Right,Proximal,Medial Malleolus: Drawtex Secondary Dressing: Wound #2 Right,Proximal,Medial Malleolus: XtraSorb Dressing Change Frequency: Wound #2 Right,Proximal,Medial Malleolus: Change dressing every week Follow-up Appointments: Wound #2 Right,Proximal,Medial Malleolus: Return Appointment in 1 week. Edema Control: Wound #2 Right,Proximal,Medial Malleolus: 4-Layer Compression System - Right Lower Extremity Proia, Daevion ([id number]) I have recommended a piece of Gore-Tex over the area of the right medial ankle and then his 4-layer Profore wrap. He will come back and see me next week Electronic Signature(s) Signed: 04/19/2015 12:06:04 PM By: Evlyn Kanner MD, FACS Entered By: Evlyn Kanner on 04/19/2015 12:06:04 [name] ([id number]) -------------------------------------------------------------------------------- SuperBill Details Patient Name: [name] Date of Service: 04/19/2015 Medical Record Number: [id number] Patient Account Number: [id number] Date of Birth/Sex: 13-Mar-1946 (69 y.o. 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 ulcer of ankle Facility Procedures CPT4: Description Modifier  Quantity Code [id number] (Facility Use Only) [phone] - APPLY MULTLAY COMPRS LWR RT 1 LEG Physician Procedures CPT4 Code Description: [id number] - WC PHYS LEVEL 3 - EST PT ICD-10 Description Diagnosis 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

## 2015-04-26 ENCOUNTER — Ambulatory Visit: Payer: Self-pay | Admitting: Surgery

## 2015-04-30 ENCOUNTER — Encounter: Payer: Medicare Other | Admitting: Surgery

## 2015-04-30 DIAGNOSIS — I87311 Chronic venous hypertension (idiopathic) with ulcer of right lower extremity: Secondary | ICD-10-CM | POA: Diagnosis not present

## 2015-04-30 DIAGNOSIS — I83013 Varicose veins of right lower extremity with ulcer of ankle: Secondary | ICD-10-CM | POA: Diagnosis not present

## 2015-04-30 DIAGNOSIS — R600 Localized edema: Secondary | ICD-10-CM | POA: Diagnosis not present

## 2015-04-30 DIAGNOSIS — L97311 Non-pressure chronic ulcer of right ankle limited to breakdown of skin: Secondary | ICD-10-CM | POA: Diagnosis not present

## 2015-04-30 DIAGNOSIS — E11621 Type 2 diabetes mellitus with foot ulcer: Secondary | ICD-10-CM | POA: Diagnosis not present

## 2015-04-30 DIAGNOSIS — E11620 Type 2 diabetes mellitus with diabetic dermatitis: Secondary | ICD-10-CM | POA: Diagnosis not present

## 2015-04-30 NOTE — Progress Notes (Addendum)
Maurice Sanders ([id number]) Visit Report for 04/30/2015 Arrival Information Details Patient Name: Maurice Sanders, Maurice Sanders Date of Service: 04/30/2015 11:45 AM Medical Record Number: [id number] Patient Account Number: [id number] Date of Birth/Sex: [birth year] (69 y.o. Male) Treating RN: Curtis Sites Primary Care Physician: Beverely Risen Other Clinician: Referring Physician: Beverely Risen Treating Physician/Extender: Rudene Re in Treatment: 63 Visit Information History Since Last Visit Added or deleted any medications: No Patient Arrived: Ambulatory Any new allergies or adverse reactions: No Arrival Time: 11:40 Had a fall or experienced change in No Accompanied By: self activities of daily living that may affect Transfer Assistance: None risk of falls: Patient Identification Verified: Yes Signs or symptoms of abuse/neglect since last No Secondary Verification Process Yes visito Completed: Hospitalized since last visit: No Patient Requires Transmission- No Pain Present Now: No Based Precautions: Patient Has Alerts: Yes Patient Alerts: Patient on Blood Thinner DM II ABI L 1.06; R 1.13 Electronic Signature(s) Signed: 04/30/2015 5:00:42 PM By: Curtis Sites Entered By: Curtis Sites on 04/30/2015 11:41:16 Maurice Sanders ([id number]) -------------------------------------------------------------------------------- Encounter Discharge Information Details Patient Name: Maurice Sanders Date of Service: 04/30/2015 11:45 AM Medical Record Number: [id number] Patient Account Number: [id number] Date of Birth/Sex: 07-Nov-1945 (69 y.o. Male) Treating RN: Curtis Sites Primary Care Physician: Beverely Risen Other Clinician: Referring Physician: Beverely Risen Treating Physician/Extender: Rudene Re in Treatment: 82 Encounter Discharge Information Items Discharge Pain Level: 0 Discharge Condition: Stable Ambulatory Status: Ambulatory Discharge Destination:  Home Private Transportation: Auto Accompanied By: self Schedule Follow-up Appointment: Yes Medication Reconciliation completed and No provided to Patient/Care Provider: Clinical Summary of Care: Electronic Signature(s) Signed: 04/30/2015 5:00:42 PM By: Curtis Sites Entered By: Curtis Sites on 04/30/2015 12:18:46 Maurice Sanders ([id number]) -------------------------------------------------------------------------------- Lower Extremity Assessment Details Patient Name: Maurice Sanders Date of Service: 04/30/2015 11:45 AM Medical Record Number: [id number] Patient Account Number: [id number] Date of Birth/Sex: 19-Mar-1946 (69 y.o. Male) Treating RN: Curtis Sites Primary Care Physician: Beverely Risen Other Clinician: Referring Physician: Beverely Risen Treating Physician/Extender: Rudene Re in Treatment: 42 Edema Assessment Assessed: [Left: No] [Right: No] E[Left: dema] [Right: :] Calf Left: Right: Point of Measurement: 33 cm From Medial Instep cm 37.2 cm Ankle Left: Right: Point of Measurement: 11 cm From Medial Instep cm 24.9 cm Vascular Assessment Pulses: Posterior Tibial Dorsalis Pedis Palpable: [Right:Yes] Extremity colors, hair growth, and conditions: Extremity Color: [Right:Hyperpigmented] Hair Growth on Extremity: [Right:No] Temperature of Extremity: [Right:Warm] Capillary Refill: [Right:< 3 seconds] Electronic Signature(s) Signed: 04/30/2015 5:00:42 PM By: Curtis Sites Entered By: Curtis Sites on 04/30/2015 11:51:29 Maurice Sanders ([id number]) -------------------------------------------------------------------------------- Multi Wound Chart Details Patient Name: Maurice Sanders Date of Service: 04/30/2015 11:45 AM Medical Record Number: [id number] Patient Account Number: [id number] Date of Birth/Sex: 1945/09/04 (69 y.o. Male) Treating RN: Huel Coventry Primary Care Physician: Beverely Risen Other Clinician: Referring Physician: Beverely Risen Treating  Physician/Extender: Rudene Re in Treatment: 42 Vital Signs Height(in): 71 Pulse(bpm): 70 Weight(lbs): 291.7 Blood Pressure 125/49 (mmHg): Body Mass Index(BMI): 41 Temperature(F): 98.3 Respiratory Rate 18 (breaths/min): Photos: [2:No Photos] [N/A:N/A] Wound Location: [2:Right Malleolus - Medial, Proximal] [N/A:N/A] Wounding Event: [2:Gradually Appeared] [N/A:N/A] Primary Etiology: [2:Venous Leg Ulcer] [N/A:N/A] Comorbid History: [2:Sleep Apnea, Congestive Heart Failure, Deep Vein Thrombosis, Hypertension, Type II Diabetes, Gout] [N/A:N/A] Date Acquired: [2:07/31/2014] [N/A:N/A] Weeks of Treatment: [2:39] [N/A:N/A] Wound Status: [2:Open] [N/A:N/A] Measurements L x W x D 0.3x4.2x0.1 [N/A:N/A] (cm) Area (cm) : [2:0.99] [N/A:N/A] Volume (cm) : [2:0.099] [N/A:N/A] % Reduction in Area: [2:-40.00%] [N/A:N/A] % Reduction in Volume: -39.40% [N/A:N/A] Classification: [2:Full Thickness Without Exposed Support Structures] [N/A:N/A] HBO  Classification: [2:Grade 1] [N/A:N/A] Exudate Amount: [2:Large] [N/A:N/A] Exudate Type: [2:Serous] [N/A:N/A] Exudate Color: [2:amber] [N/A:N/A] Wound Margin: [2:Indistinct, nonvisible] [N/A:N/A] Granulation Amount: [2:Medium (34-66%)] [N/A:N/A] Granulation Quality: [2:Red] [N/A:N/A] Necrotic Amount: [2:Medium (34-66%)] [N/A:N/A] Exposed Structures: Fascia: No N/A N/A Fat: No Tendon: No Muscle: No Joint: No Bone: No Limited to Skin Breakdown Epithelialization: Medium (34-66%) N/A N/A Periwound Skin Texture: Edema: Yes N/A N/A Scarring: Yes Excoriation: No Induration: No Callus: No Crepitus: No Fluctuance: No Friable: No Rash: No Periwound Skin Maceration: Yes N/A N/A Moisture: Moist: Yes Dry/Scaly: No Periwound Skin Color: Hemosiderin Staining: Yes N/A N/A Atrophie Blanche: No Cyanosis: No Ecchymosis: No Erythema: No Mottled: No Pallor: No Rubor: No Tenderness on No N/A N/A Palpation: Wound Preparation: Ulcer  Cleansing: Other: N/A N/A soap and water Topical Anesthetic Applied: Other: lidocaine 4% Treatment Notes Electronic Signature(s) Signed: 05/01/2015 5:26:34 PM By: Elliot Gurney, RN, BSN, Kim RN, BSN Entered By: Elliot Gurney, RN, BSN, Kim on 04/30/2015 12:01:31 Maurice Sanders ([id number]) -------------------------------------------------------------------------------- Multi-Disciplinary Care Plan Details Patient Name: Maurice Sanders Date of Service: 04/30/2015 11:45 AM Medical Record Number: [id number] Patient Account Number: [id number] Date of Birth/Sex: 05/03/1946 (69 y.o. Male) Treating RN: Huel Coventry Primary Care Physician: Beverely Risen Other Clinician: Referring Physician: Beverely Risen Treating Physician/Extender: Rudene Re in Treatment: 39 Active Inactive Abuse / Safety / Falls / Self Care Management Nursing Diagnoses: Potential for falls Goals: Patient will remain injury free Date Initiated: 07/10/2014 Goal Status: Active Interventions: Assess fall risk on admission and as needed Notes: Nutrition Nursing Diagnoses: Potential for alteratiion in Nutrition/Potential for imbalanced nutrition Goals: Patient/caregiver agrees to and verbalizes understanding of need to use nutritional supplements and/or vitamins as prescribed Date Initiated: 07/10/2014 Goal Status: Active Interventions: Assess patient nutrition upon admission and as needed per policy Notes: Orientation to the Wound Care Program Nursing Diagnoses: Knowledge deficit related to the wound healing center program Goals: Patient/caregiver will verbalize understanding of the Wound Healing Center Program [place], [place] ([id number]) Date Initiated: 07/10/2014 Goal Status: Active Interventions: Provide education on orientation to the wound center Notes: Wound/Skin Impairment Nursing Diagnoses: Impaired tissue integrity Goals: Ulcer/skin breakdown will have a volume reduction of 30% by week 4 Date Initiated:  07/10/2014 Goal Status: Active Interventions: Assess ulceration(s) every visit Notes: Electronic Signature(s) Signed: 05/01/2015 5:26:34 PM By: Elliot Gurney, RN, BSN, Kim RN, BSN Entered By: Elliot Gurney, RN, BSN, Kim on 04/30/2015 12:01:16 Maurice Sanders ([id number]) -------------------------------------------------------------------------------- Patient/Caregiver Education Details Patient Name: Maurice Sanders Date of Service: 04/30/2015 11:45 AM Medical Record Number: [id number] Patient Account Number: [id number] Date of Birth/Gender: 11-25-1945 (69 y.o. Male) Treating RN: Curtis Sites Primary Care Physician: Beverely Risen Other Clinician: Referring Physician: Beverely Risen Treating Physician/Extender: Rudene Re in Treatment: 21 Education Assessment Education Provided To: Patient Education Topics Provided Basic Hygiene: Handouts: Other: wash toes Methods: Explain/Verbal Responses: State content correctly Electronic Signature(s) Signed: 04/30/2015 5:00:42 PM By: Curtis Sites Entered By: Curtis Sites on 04/30/2015 12:20:08 Maurice Sanders ([id number]) -------------------------------------------------------------------------------- Wound Assessment Details Patient Name: Maurice Sanders Date of Service: 04/30/2015 11:45 AM Medical Record Number: [id number] Patient Account Number: [id number] Date of Birth/Sex: 1945-12-17 (69 y.o. Male) Treating RN: Curtis Sites Primary Care Physician: Beverely Risen Other Clinician: Referring Physician: Beverely Risen Treating Physician/Extender: Rudene Re in Treatment: 42 Wound Status Wound Number: 2 Primary Venous Leg Ulcer Etiology: Wound Location: Right Malleolus - Medial, Proximal Wound Open Status: Wounding Event: Gradually Appeared Comorbid Sleep Apnea, Congestive Heart Failure, Date Acquired: 07/31/2014 History: Deep Vein Thrombosis, Hypertension, Weeks Of Treatment: 39 Type II Diabetes, Gout Clustered  Wound: No Photos Photo  Uploaded By: Curtis Sites on 04/30/2015 12:30:18 Wound Measurements Length: (cm) 0.3 Width: (cm) 4.2 Depth: (cm) 0.1 Area: (cm) 0.99 Volume: (cm) 0.099 % Reduction in Area: -40% % Reduction in Volume: -39.4% Epithelialization: Medium (34-66%) Tunneling: No Undermining: No Wound Description Full Thickness Without Exposed Classification: Support Structures Diabetic Severity Grade 1 (Wagner): Wound Margin: Indistinct, nonvisible Exudate Amount: Large Exudate Type: Serous Exudate Color: amber Wound Bed Granulation Amount: Medium (34-66%) Exposed Structure Nola, Noah ([id number]) Granulation Quality: Red Fascia Exposed: No Necrotic Amount: Medium (34-66%) Fat Layer Exposed: No Necrotic Quality: Adherent Slough Tendon Exposed: No Muscle Exposed: No Joint Exposed: No Bone Exposed: No Limited to Skin Breakdown Periwound Skin Texture Texture Color No Abnormalities Noted: No No Abnormalities Noted: No Callus: No Atrophie Blanche: No Crepitus: No Cyanosis: No Excoriation: No Ecchymosis: No Fluctuance: No Erythema: No Friable: No Hemosiderin Staining: Yes Induration: No Mottled: No Localized Edema: Yes Pallor: No Rash: No Rubor: No Scarring: Yes Moisture No Abnormalities Noted: No Dry / Scaly: No Maceration: Yes Moist: Yes Wound Preparation Ulcer Cleansing: Other: soap and water, Topical Anesthetic Applied: Other: lidocaine 4%, Treatment Notes Wound #2 (Right, Proximal, Medial Malleolus) 1. Cleansed with: Cleanse wound with antibacterial soap and water 2. Anesthetic Topical Lidocaine 4% cream to wound bed prior to debridement 4. Dressing Applied: Other dressing (specify in notes) 7. Secured with 4-Layer Compression System - Right Lower Extremity Notes drawtex, xtrasorb, nystatin powder to top of foot under wrap Electronic Signature(s) Signed: 04/30/2015 5:00:42 PM By: Curtis Sites Entered By: Curtis Sites on 04/30/2015 11:57:35 Maurice Sanders ([id number]) AXYL, SITZMAN ([id number]) -------------------------------------------------------------------------------- Vitals Details Patient Name: Maurice Sanders Date of Service: 04/30/2015 11:45 AM Medical Record Number: [id number] Patient Account Number: [id number] Date of Birth/Sex: [birth year] (69 y.o. Male) Treating RN: Curtis Sites Primary Care Physician: Beverely Risen Other Clinician: Referring Physician: Beverely Risen Treating Physician/Extender: Rudene Re in Treatment: 42 Vital Signs Time Taken: 11:41 Temperature (F): 98.3 Height (in): 71 Pulse (bpm): 70 Weight (lbs): 291.7 Respiratory Rate (breaths/min): 18 Body Mass Index (BMI): 40.7 Blood Pressure (mmHg): 125/49 Reference Range: 80 - 120 mg / dl Electronic Signature(s) Signed: 04/30/2015 5:00:42 PM By: Curtis Sites Entered By: Curtis Sites on 04/30/2015 11:43:26

## 2015-04-30 NOTE — Progress Notes (Addendum)
[name], [name] ([id number]) Visit Report for 04/30/2015 Chief Complaint Document Details Patient Name: Maurice Sanders, Maurice Sanders Date of Service: 04/30/2015 11:45 AM Medical Record Number: [id number] Patient Account Number: [id number] Date of Birth/Sex: 10-06-1945 (69 y.o. Male) Treating RN: Curtis Sites Primary Care Physician: Beverely Risen Other Clinician: Referring Physician: Beverely Risen Treating Physician/Extender: Rudene Re in Treatment: 32 Information Obtained from: Patient Chief Complaint [place] has been coming for dressing changes twice a week, on Monday and Thursday. Over all he is doing fine he has his blood sugar under control and is managing his diuretics well. after changing over from calamine to zinc oxide he said his own Unna's boot still gave him a bit of itching and problems and he had significant swelling which made the Unna's boot fairly tight. Electronic Signature(s) Signed: 04/30/2015 11:38:34 AM By: Evlyn Kanner MD, FACS Entered By: Evlyn Kanner on 04/30/2015 11:38:34 Maurice Sanders ([id number]) -------------------------------------------------------------------------------- HPI Details Patient Name: Maurice Sanders Date of Service: 04/30/2015 11:45 AM Medical Record Number: [id number] Patient Account Number: [id number] Date of Birth/Sex: [birth year] (69 y.o. Male) Treating RN: Curtis Sites Primary Care Physician: Beverely Risen Other Clinician: Referring Physician: Beverely Risen Treating Physician/Extender: Rudene Re in Treatment: 62 History of Present Illness Location: right lower extremity Quality: Patient reports experiencing a dull pain to affected area(s). Severity: patient denies any severe pain. Duration: he has had this wound for a few weeks now. Timing: Pain in wound is Intermittent (comes and goes Context: the patient has a long history of venous insufficiency. He has been very compliant with wearing his compression stockings. Modifying Factors:  the patient normally wears compression stockings. Recently he had placed a fair amount of Vaseline which cause skin irritation. Associated Signs and Symptoms: Patient denies any fevers. He has had a moderate amount of drainage. HPI Description: The patient is 69 year-old male with a history of diabetes who presents with right lower extremity wound. He first presented to our clinic on 07/10/2014. He does have a history of venous insufficiency in the past. He has been very compliant with wearing his compression stockings. He recently Vaseline on his legs which caused skin irritation and breakdown. The patient returns for follow-up today. he denies any fevers at home. However due to the fact that he had stopped his diuretic he has had a lot of edema of the lower extremity. He also has a lot of scaly skin on his right lower extremity. 10/02/14 -- he has resumed his diuretics on a regular basis and has been walking around adequately and has overall improved. No pain or no discharge from the wounds. 10/09/14 -- the patient has been having some pain at night while sleeping and was requesting pain medications. He also says that due to taking his diuretics he's been getting some cramps and I have asked him to please see his PCP regarding both these problems. Also awaiting insurance clearance regarding his Apligraf. 10/16/2014. Vascular note reviewed in detail. I have been able to review his notes from Dr. Gilda Crease on 08/17/2014. 1.The venous duplex study done on 08/17/2014 showed that all veins visualized showed no evidence of DVT. 2. no incompetence of the greater small saphenous veins bilaterally. The plan was that he had no surgery or intervention needed at this point. The long discussion had showed that the patient had chronic skin changes accompanied by venous insufficiency and at this stage he would benefit from wearing graduated compression stockings of the 20-30 mmHg on a daily basis and  review would be done in  his office back again in 6 months. At that time the patient could have a lymph pump. 10/23/14 -- Culture report --- His wound culture has grown Citrobacter koseri and Enterococcus faecalis. Final report is back but so far sensitive to Bactrim and ampicillin and ciprofloxacin. Bactrim DS was called in for 14 days. Maurice Sanders, Maurice Sanders ([id number]) 11/13/2014 last week we changed his Unna boot on Monday and Thursday and this has helped him a lot. The edema has been much better the Unna boot hasn't slipped down and he has not had any skin abrasions. 11/20/2014 -- he saw the vascular surgeon Dr. Gilda Crease this morning and he was pleased with his progress and has said he would be ordering him some lymphedema pumps. Other than that the patient is coming to change his Unna's boots twice a week and he is doing well with this. 11/27/2014 -- over Saturday and Sunday he started having more swelling in his leg and some pain and I believe his own Unna's boot caused him some discomfort and excoriation of skin. He does admit he took some more juices orally but he has continued his diuretic. 12/11/2014 -- he has completed his course of antibiotics and has had no fresh issues. He has had no pain no swelling and is managing well with his blood sugars and diuretics. 12/14/2014 between Monday and today he has had some dietary indiscretions and had some [place] food which may have made him retain a lot of fluid. His edema has increased markedly and a lot of more excoriation. 12/25/2014 -- This week he has really done well and his edema is under control and these had no pain or problems. His leg is feeling good. 01/04/2015 -- He has done great along the way and he has managed to keep his wrap for almost her entire week. No issues or problems. 02/01/2015 -- he is doing fine and easier for his first application of the Apligraf skin substitute. 02/08/2015 -- he had a lot of drainage to the dressing  over the Apligraf and this will be addressed today. 02/15/2015 -- his wound looks very good and there was not much of drainage. He will have a second application of Apligraf. 02/22/2015 -- his Apligraf is in place and he has significant drainage. 02/27/2015 -- he is here for his third application of Apligraf 03/06/2015 -- he is doing well and is here for a wound check. He did have significant drainage this week too. 03/15/2015 -- he has done better with his wound and not had any reaction to the Profore 4-layer wrap. He does feel however that a small lace opened where the Steri-Strip was. He does not have an Apligraf ordered for him today. 03/21/2015 -- he is done fine and is here for his fourth application of Apligraf. 03/27/2015 -- he is here for a wound check and other than that has been tolerating his Profore 4-layer wrap. 04/05/2015 -- he has done fine and is here for his fifth application of Apligraf Electronic Signature(s) Signed: 04/30/2015 11:38:59 AM By: Evlyn Kanner MD, FACS Entered By: Evlyn Kanner on 04/30/2015 11:38:58 Maurice Sanders ([id number]) -------------------------------------------------------------------------------- Physical Exam Details Patient Name: Maurice Sanders Date of Service: 04/30/2015 11:45 AM Medical Record Number: [id number] Patient Account Number: [id number] Date of Birth/Sex: [birth year] (69 y.o. Male) Treating RN: Curtis Sites Primary Care Physician: Beverely Risen Other Clinician: Referring Physician: Beverely Risen Treating Physician/Extender: Rudene Re in Treatment: 42 Constitutional . Pulse regular. Respirations normal and unlabored. Afebrile. . Eyes Nonicteric. Reactive to light.  Ears, Nose, Mouth, and Throat Lips, teeth, and gums WNL.Marland Kitchen Moist mucosa without lesions . Neck supple and nontender. No palpable supraclavicular or cervical adenopathy. Normal sized without goiter. Respiratory WNL. No retractions.. Cardiovascular Pedal Pulses  WNL. No clubbing, cyanosis or edema. Lymphatic No adneopathy. No adenopathy. No adenopathy. Musculoskeletal Adexa without tenderness or enlargement.. Digits and nails w/o clubbing, cyanosis, infection, petechiae, ischemia, or inflammatory conditions.. Integumentary (Hair, Skin) No suspicious lesions. No crepitus or fluctuance. No peri-wound warmth or erythema. No masses.Marland Kitchen Psychiatric Judgement and insight Intact.. No evidence of depression, anxiety, or agitation.. Notes He has 2 tiny areas open and the rest of the leg looks excellent. There is minimal pedal edema and no lower extremity edema. Electronic Signature(s) Signed: 04/30/2015 12:06:01 PM By: Evlyn Kanner MD, FACS Entered By: Evlyn Kanner on 04/30/2015 12:06:00 Maurice Sanders ([id number]) -------------------------------------------------------------------------------- Physician Orders Details Patient Name: Maurice Sanders Date of Service: 04/30/2015 11:45 AM Medical Record Number: [id number] Patient Account Number: [id number] Date of Birth/Sex: Jun 09, 1946 (69 y.o. 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 Skin Barriers/Peri-Wound Care Wound #2 Right,Proximal,Medial Malleolus o Antifungal powder - top of foot Primary Wound Dressing Wound #2 Right,Proximal,Medial Malleolus o Drawtex Dressing Change Frequency Wound #2 Right,Proximal,Medial Malleolus o  Change dressing every week Follow-up Appointments Wound #2 Right,Proximal,Medial Malleolus o Return Appointment in 1 week. Edema Control Wound #2 Right,Proximal,Medial Malleolus o 4-Layer Compression System - Right Lower Extremity Electronic Signature(s) Signed: 04/30/2015 4:29:43 PM By: Evlyn Kanner MD, FACS Signed: 05/01/2015 5:26:34 PM By: Elliot Gurney RN, BSN, Kim RN, BSN [place], [place]oy ([id number]) Entered By: Elliot Gurney, RN, BSN, Kim on 04/30/2015 12:03:59 Maurice Sanders ([id number]) -------------------------------------------------------------------------------- Problem List Details Patient Name: Maurice Sanders Date of Service: 04/30/2015 11:45 AM Medical Record Number: [id number] Patient Account Number: [id number] Date of Birth/Sex: Feb 09, 1946 (69 y.o. 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 Male) Treating RN: Curtis Sites Primary Care Physician: Beverely Risen Other Clinician: Referring Physician: Beverely Risen Treating Physician/Extender: Rudene Re in Treatment: 42 Subjective Chief Complaint Information obtained from Patient [place] has been coming for dressing changes twice a week, on Monday and Thursday. Over all he is doing fine he has his blood sugar under control and is managing his diuretics well. after changing over from calamine to zinc oxide he said his own Unna's boot still gave him a bit of itching and problems and he had significant swelling which made  the Unna's boot fairly tight. History of Present Illness (HPI) The following HPI elements were documented for the patient's wound: Location: right lower extremity Quality: Patient reports experiencing a dull pain to affected area(s). Severity: patient denies any severe pain. Duration: he has had this wound for a few weeks now. Timing: Pain in wound is Intermittent (comes and goes Context: the patient has a long history of venous insufficiency. He has been very compliant with wearing his compression stockings. Modifying Factors: the patient normally wears compression stockings. Recently he had placed a fair amount of Vaseline which cause skin irritation. Associated Signs and Symptoms: Patient denies any fevers. He has had a moderate amount of drainage. The patient is 69 year-old male with a history of diabetes who presents with right lower extremity wound. He first presented to our clinic on 07/10/2014. He does have a history of venous insufficiency in the past. He has been very compliant with wearing his compression stockings. He recently Vaseline on his legs which caused skin irritation and breakdown. The patient returns for follow-up today. he denies any fevers at home. However due to the fact that he had stopped his diuretic he has had a lot of edema of the lower extremity. He also has a lot of scaly skin on his right lower extremity. 10/02/14 -- he has resumed his diuretics on a regular basis and has been walking around adequately and has overall improved.  No pain or no discharge from the wounds. 10/09/14 -- the patient has been having some pain at night while sleeping and was requesting pain medications. He also says that due to taking his diuretics he's been getting some cramps and I have asked him to please see his PCP regarding both these problems. Also awaiting insurance clearance regarding his Apligraf. 10/16/2014. Vascular note reviewed in detail. I have been able to review his notes from Dr. Gilda Crease on 08/17/2014. 1.The venous duplex study done on 08/17/2014 showed that all veins visualized showed no evidence of DVT. Maurice Sanders, Maurice Sanders ([id number]) 2. no incompetence of the greater small saphenous veins bilaterally. The plan was that he had no surgery or intervention needed at this point. The long discussion had showed that the patient had chronic skin changes accompanied by venous insufficiency and at this stage he would benefit from wearing graduated compression stockings of the 20-30 mmHg on a daily basis and review would be done in his office back again in 6 months. At that time the patient could have a lymph pump. 10/23/14 -- Culture report --- His wound culture has grown Citrobacter koseri and Enterococcus faecalis. Final report is back but so far sensitive to Bactrim and ampicillin and ciprofloxacin. Bactrim DS was called in for 14 days. 11/13/2014 last week we changed his Unna boot on Monday and Thursday and this has helped him a lot. The edema has been much better the Unna boot hasn't slipped down and he has not had any skin abrasions. 11/20/2014 -- he saw the vascular surgeon Dr. Gilda Crease this morning and he was pleased with his progress and has said he would be ordering him some lymphedema pumps. Other than that the patient is coming to change his Unna's boots twice a week and he is doing well with this. 11/27/2014 -- over Saturday and Sunday he started having more swelling in his leg and some pain and I believe his own Unna's boot  caused him some discomfort and excoriation of skin. He does admit he took some more juices orally but he has  continued his diuretic. 12/11/2014 -- he has completed his course of antibiotics and has had no fresh issues. He has had no pain no swelling and is managing well with his blood sugars and diuretics. 12/14/2014 between Monday and today he has had some dietary indiscretions and had some [place] food which may have made him retain a lot of fluid. His edema has increased markedly and a lot of more excoriation. 12/25/2014 -- This week he has really done well and his edema is under control and these had no pain or problems. His leg is feeling good. 01/04/2015 -- He has done great along the way and he has managed to keep his wrap for almost her entire week. No issues or problems. 02/01/2015 -- he is doing fine and easier for his first application of the Apligraf skin substitute. 02/08/2015 -- he had a lot of drainage to the dressing over the Apligraf and this will be addressed today. 02/15/2015 -- his wound looks very good and there was not much of drainage. He will have a second application of Apligraf. 02/22/2015 -- his Apligraf is in place and he has significant drainage. 02/27/2015 -- he is here for his third application of Apligraf 03/06/2015 -- he is doing well and is here for a wound check. He did have significant drainage this week too. 03/15/2015 -- he has done better with his wound and not had any reaction to the Profore 4-layer wrap. He does feel however that a small lace opened where the Steri-Strip was. He does not have an Apligraf ordered for him today. 03/21/2015 -- he is done fine and is here for his fourth application of Apligraf. 03/27/2015 -- he is here for a wound check and other than that has been tolerating his Profore 4-layer wrap. 04/05/2015 -- he has done fine and is here for his fifth application of Apligraf Maurice Sanders, Maurice Sanders  ([id number]) Objective Constitutional Pulse regular. Respirations normal and unlabored. Afebrile. Vitals Time Taken: 11:41 AM, Height: 71 in, Weight: 291.7 lbs, BMI: 40.7, Temperature: 98.3 F, Pulse: 70 bpm, Respiratory Rate: 18 breaths/min, Blood Pressure: 125/49 mmHg. Eyes Nonicteric. Reactive to light. Ears, Nose, Mouth, and Throat Lips, teeth, and gums WNL.Marland Kitchen Moist mucosa without lesions . Neck supple and nontender. No palpable supraclavicular or cervical adenopathy. Normal sized without goiter. Respiratory WNL. No retractions.. Cardiovascular Pedal Pulses WNL. No clubbing, cyanosis or edema. Lymphatic No adneopathy. No adenopathy. No adenopathy. Musculoskeletal Adexa without tenderness or enlargement.. Digits and nails w/o clubbing, cyanosis, infection, petechiae, ischemia, or inflammatory conditions.Marland Kitchen Psychiatric Judgement and insight Intact.. No evidence of depression, anxiety, or agitation.. General Notes: He has 2 tiny areas open and the rest of the leg looks excellent. There is minimal pedal edema and no lower extremity edema. Integumentary (Hair, Skin) No suspicious lesions. No crepitus or fluctuance. No peri-wound warmth or erythema. No masses.. Wound #2 status is Open. Original cause of wound was Gradually Appeared. The wound is located on the Right,Proximal,Medial Malleolus. The wound measures 0.3cm length x 4.2cm width x 0.1cm depth; 0.99cm^2 area and 0.099cm^3 volume. The wound is limited to skin breakdown. There is no tunneling or Baskette, Giankarlo ([id number]) undermining noted. There is a large amount of serous drainage noted. The wound margin is indistinct and nonvisible. There is medium (34-66%) red granulation within the wound bed. There is a medium (34-66%) amount of necrotic tissue within the wound bed including Adherent Slough. The periwound skin appearance exhibited: Localized Edema, Scarring, Maceration, Moist, Hemosiderin Staining. The periwound  skin appearance did  not exhibit: Callus, Crepitus, Excoriation, Fluctuance, Friable, Induration, Rash, Dry/Scaly, Atrophie Blanche, Cyanosis, Ecchymosis, Mottled, Pallor, Rubor, Erythema. 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 I have asked him to bring his compression stockings with him next week and if everything looks good we'll send him home with this. Plan Wound Cleansing: Wound #2 Right,Proximal,Medial Malleolus: Clean wound with Normal Saline. Skin Barriers/Peri-Wound Care: Wound #2 Right,Proximal,Medial Malleolus: Antifungal powder - top of foot Primary Wound Dressing: Wound #2 Right,Proximal,Medial Malleolus: Drawtex Dressing Change Frequency: Wound #2 Right,Proximal,Medial Malleolus: Change dressing every week Follow-up Appointments: Wound #2 Right,Proximal,Medial Malleolus: Return Appointment in 1 week. Edema Control: Wound #2 Right,Proximal,Medial Malleolus: 4-Layer Compression System - Right Lower Extremity Maurice Sanders, Maurice Sanders ([id number]) He forgot to bring his compression stockings and hence we will use a piece of DrawTex and a 4-layer Profore wrap this week. I have asked him to bring his compression stockings with him next week and if everything looks good we'll send him home with this. Electronic Signature(s) Signed: 04/30/2015 12:07:09 PM By: Evlyn Kanner MD, FACS Entered By: Evlyn Kanner on 04/30/2015 12:07:09 Maurice Sanders ([id number]) -------------------------------------------------------------------------------- SuperBill Details Patient Name: Maurice Sanders Date of Service: 04/30/2015 Medical Record Number: [id number] Patient  Account Number: [id number] Date of Birth/Sex: 03-26-1946 (69 y.o. 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 ulcer of ankle Facility Procedures CPT4: Description Modifier Quantity Code [id number] (Facility Use Only) [phone] - APPLY MULTLAY COMPRS LWR RT 1 LEG Physician Procedures CPT4 Code Description: [id number] - WC PHYS LEVEL 3 - EST PT ICD-10 Description Diagnosis 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

## 2015-05-02 DIAGNOSIS — G4733 Obstructive sleep apnea (adult) (pediatric): Secondary | ICD-10-CM | POA: Diagnosis not present

## 2015-05-03 ENCOUNTER — Ambulatory Visit: Payer: Self-pay | Admitting: Surgery

## 2015-05-07 ENCOUNTER — Encounter: Payer: Medicare Other | Attending: Surgery | Admitting: Surgery

## 2015-05-07 DIAGNOSIS — I83013 Varicose veins of right lower extremity with ulcer of ankle: Secondary | ICD-10-CM | POA: Diagnosis not present

## 2015-05-07 DIAGNOSIS — Z872 Personal history of diseases of the skin and subcutaneous tissue: Secondary | ICD-10-CM | POA: Diagnosis not present

## 2015-05-07 DIAGNOSIS — R600 Localized edema: Secondary | ICD-10-CM | POA: Diagnosis not present

## 2015-05-07 DIAGNOSIS — E11620 Type 2 diabetes mellitus with diabetic dermatitis: Secondary | ICD-10-CM | POA: Insufficient documentation

## 2015-05-07 DIAGNOSIS — L97311 Non-pressure chronic ulcer of right ankle limited to breakdown of skin: Secondary | ICD-10-CM | POA: Insufficient documentation

## 2015-05-07 DIAGNOSIS — I87311 Chronic venous hypertension (idiopathic) with ulcer of right lower extremity: Secondary | ICD-10-CM | POA: Diagnosis not present

## 2015-05-07 DIAGNOSIS — Z09 Encounter for follow-up examination after completed treatment for conditions other than malignant neoplasm: Secondary | ICD-10-CM | POA: Diagnosis not present

## 2015-05-08 NOTE — Progress Notes (Signed)
Maurice Sanders, Maurice Sanders ([id number]) Visit Report for 05/07/2015 Arrival Information Details Patient Name: Maurice Sanders Date of Service: 05/07/2015 8:00 AM Medical Record Number: [id number] Patient Account Number: [id number] Date of Birth/Sex: 1946-06-06 (69 y.o. Male) Treating RN: Curtis Sites Primary Care Physician: Beverely Risen Other Clinician: Referring Physician: Beverely Risen Treating Physician/Extender: Rudene Re in Treatment: 79 Visit Information History Since Last Visit Added or deleted any medications: No Patient Arrived: Ambulatory Any new allergies or adverse reactions: No Arrival Time: 08:11 Had a fall or experienced change in No Accompanied By: self activities of daily living that may affect Transfer Assistance: None risk of falls: Patient Identification Verified: Yes Signs or symptoms of abuse/neglect since last No Secondary Verification Process Yes visito Completed: Hospitalized since last visit: No Patient Requires Transmission- No Pain Present Now: No Based Precautions: Patient Has Alerts: Yes Patient Alerts: Patient 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 Male) Treating RN: Curtis Sites Primary Care Physician: Beverely Risen Other Clinician: Referring Physician: Beverely Risen Treating Physician/Extender: Rudene Re in Treatment: 63 Clinic Level of Care Assessment Items TOOL 4 Quantity Score  - Use when only an EandM is performed on FOLLOW-UP visit 0 ASSESSMENTS - Nursing Assessment / Reassessment X -  Reassessment of Co-morbidities (includes updates in patient status) 1 10 X - Reassessment of Adherence to Treatment Plan 1 5 ASSESSMENTS - Wound and Skin Assessment / Reassessment  - Simple Wound Assessment / Reassessment - one wound 0  - Complex Wound Assessment / Reassessment - multiple wounds 0  - Dermatologic / Skin Assessment (not related to wound area) 0 ASSESSMENTS - Focused Assessment X - Circumferential Edema Measurements - multi extremities 1 5  - Nutritional Assessment / Counseling / Intervention 0 X - Lower Extremity Assessment (monofilament, tuning fork, pulses) 1 5  - Peripheral Arterial Disease Assessment (using hand held doppler) 0 ASSESSMENTS - Ostomy and/or Continence Assessment and Care  - Incontinence Assessment and Management 0  - Ostomy Care Assessment and Management (repouching, etc.) 0 PROCESS - Coordination of Care X - Simple Patient / Family Education for ongoing care 1 15  - Complex (extensive) Patient / Family Education for ongoing care 0  - Staff obtains Chiropractor, Records, Test Results / Process Orders 0  - Staff telephones HHA, Nursing Homes / Clarify orders / etc 0  - Routine Transfer to another Facility (non-emergent condition) 0 Maurice Sanders, Maurice Sanders ([id number])  - Routine Hospital Admission (non-emergent condition) 0  - New Admissions / Manufacturing engineer / Ordering NPWT, Apligraf, etc. 0  - Emergency Hospital Admission (emergent condition) 0 X - Simple Discharge Coordination 1 10  - Complex (extensive) Discharge Coordination 0 PROCESS - Special Needs  - Pediatric / Minor Patient Management 0  - Isolation Patient Management 0  - Hearing / Language / Visual special needs 0  - Assessment of Community assistance (transportation, D/C planning, etc.) 0  - Additional assistance / Altered mentation 0  - Support Surface(s) Assessment (bed, cushion, seat, etc.) 0 INTERVENTIONS - Wound Cleansing / Measurement  -  Simple Wound Cleansing - one wound 0  - Complex Wound Cleansing - multiple wounds 0  - Wound Imaging (photographs - any number of wounds) 0  -  Wound Tracing (instead of photographs) 0 []  - Simple Wound Measurement - one wound 0 []  - Complex Wound Measurement - multiple wounds 0 INTERVENTIONS - Wound Dressings []  - Small Wound Dressing one or multiple wounds 0 []  - Medium Wound Dressing one or multiple wounds 0 []  - Large Wound Dressing one or multiple wounds 0 []  - Application of Medications - topical 0 []  - Application of Medications - injection 0 INTERVENTIONS - Miscellaneous []  - External ear exam 0 Maurice Sanders, Maurice Sanders ([id number]) []  - Specimen Collection (cultures, biopsies, blood, body fluids, etc.) 0 []  - Specimen(s) / Culture(s) sent or taken to Lab for analysis 0 []  - Patient Transfer (multiple staff / Michiel Sites Lift / Similar devices) 0 []  - Simple Staple / Suture removal (25 or less) 0 []  - Complex Staple / Suture removal (26 or more) 0 []  - Hypo / Hyperglycemic Management (close monitor of Blood Glucose) 0 []  - Ankle / Brachial Index (ABI) - do not check if billed separately 0 X - Vital Signs 1 5 Has the patient been seen at the hospital within the last three years: Yes Total Score: 55 Level Of Care: New/Established - Level 2 Electronic Signature(s) Signed: 05/07/2015 4:58:54 PM By: Curtis Sites Entered By: Curtis Sites on 05/07/2015 08:25:50 Maurice Sanders ([id number]) -------------------------------------------------------------------------------- Encounter Discharge Information Details Patient Name: Maurice Sanders Date of Service: 05/07/2015 8:00 AM Medical Record Number: [id number] Patient Account Number: [id number] Date of Birth/Sex: 12-09-1945 (69 y.o. Male) Treating RN: Curtis Sites Primary Care Physician: Beverely Risen Other Clinician: Referring Physician: Beverely Risen Treating Physician/Extender: Rudene Re in Treatment: 8 Encounter Discharge  Information Items Discharge Pain Level: 0 Discharge Condition: Stable Ambulatory Status: Ambulatory Discharge Destination: Home Transportation: Private Auto Accompanied By: self Schedule Follow-up Appointment: No Medication Reconciliation completed and provided to Patient/Care No Alizandra Loh: Provided on Clinical Summary of Care: 05/07/2015 Form Type Recipient Paper Patient AS Electronic Signature(s) Signed: 05/07/2015 8:44:06 AM By: Gwenlyn Perking Entered By: Gwenlyn Perking on 05/07/2015 08:44:05 Maurice Sanders ([id number]) -------------------------------------------------------------------------------- Lower Extremity Assessment Details Patient Name: Maurice Sanders Date of Service: 05/07/2015 8:00 AM Medical Record Number: [id number] Patient Account Number: [id number] Date of Birth/Sex: 02/10/1946 (69 y.o. Male) Treating RN: Curtis Sites Primary Care Physician: Beverely Risen Other Clinician: Referring Physician: Beverely Risen Treating Physician/Extender: Rudene Re in Treatment: 43 Edema Assessment Assessed: [Left: No] [Right: No] Edema: [Left: Ye] [Right: s] Calf Left: Right: Point of Measurement: 33 cm From Medial Instep cm 35.9 cm Ankle Left: Right: Point of Measurement: 11 cm From Medial Instep cm 24.6 cm Vascular Assessment Pulses: Posterior Tibial Dorsalis Pedis Palpable: [Right:Yes] Extremity colors, hair growth, and conditions: Extremity Color: [Right:Hyperpigmented] Hair Growth on Extremity: [Right:No] Temperature of Extremity: [Right:Warm] Capillary Refill: [Right:< 3 seconds] Toe Nail Assessment Left: Right: Thick: Yes Discolored: Yes Deformed: Yes Improper Length and Hygiene: No Electronic Signature(s) Signed: 05/07/2015 4:58:54 PM By: Curtis Sites Entered By: Curtis Sites on 05/07/2015 08:17:20 Maurice Sanders ([id number]) -------------------------------------------------------------------------------- Multi-Disciplinary Care Plan  Details Patient Name: Maurice Sanders Date of Service: 05/07/2015 8:00 AM Medical Record Number: [id number] Patient Account Number: [id number] Date of Birth/Sex: 04-09-1946 (69 y.o. Male) Treating RN: Curtis Sites Primary Care Physician: Beverely Risen Other Clinician: Referring Physician: Beverely Risen Treating Physician/Extender: Rudene Re in Treatment: 65 Active Inactive Electronic Signature(s) Signed: 05/07/2015 4:58:54 PM By: Curtis Sites Entered By: Curtis Sites on 05/07/2015 08:33:03 Maurice Sanders ([id number]) -------------------------------------------------------------------------------- Patient/Caregiver Education Details Patient Name: Maurice Sanders Date of Service: 05/07/2015 8:00 AM Medical Record Number: [id number] Patient Account Number: [id number]  Date of Birth/Gender: 08-17-1945 (69 y.o. Male) Treating RN: Curtis Sites Primary Care Physician: Beverely Risen Other Clinician: Referring Physician: Beverely Risen Treating Physician/Extender: Rudene Re in Treatment: 63 Education Assessment Education Provided To: Patient Education Topics Provided Venous: Handouts: Other: continue with compression hose daily - on in the morning and off at bedtime Methods: Explain/Verbal Responses: State content correctly Electronic Signature(s) Signed: 05/07/2015 4:58:54 PM By: Curtis Sites Entered By: Curtis Sites on 05/07/2015 91:47:82 Maurice Sanders ([id number]) -------------------------------------------------------------------------------- Wound Assessment Details Patient Name: Maurice Sanders Date of Service: 05/07/2015 8:00 AM Medical Record Number: [id number] Patient Account Number: [id number] Date of Birth/Sex: [birth year] (69 y.o. Male) Treating RN: Curtis Sites Primary Care Physician: Beverely Risen Other Clinician: Referring Physician: Beverely Risen Treating Physician/Extender: Rudene Re in Treatment: 43 Wound Status Wound Number: 2 Primary  Etiology: Venous Leg Ulcer Wound Location: Right, Proximal, Medial Wound Status: Open Malleolus Wounding Event: Gradually Appeared Date Acquired: 07/31/2014 Weeks Of Treatment: 40 Clustered Wound: No Photos Photo Uploaded By: Curtis Sites on 05/07/2015 10:02:16 Wound Measurements Length: (cm) Width: (cm) Depth: (cm) Area: (cm) Volume: (cm) 0 % Reduction in Area: 100% 0 % Reduction in Volume: 100% 0 0 0 Wound Description Full Thickness Without Exposed Classification: Support Structures Periwound Skin Texture Texture Color No Abnormalities Noted: No No Abnormalities Noted: No Moisture No Abnormalities Noted: No Electronic Signature(s) Maurice Sanders, Maurice Sanders ([id number]) Signed: 05/07/2015 4:58:54 PM By: Curtis Sites Entered By: Curtis Sites on 05/07/2015 08:23:52 Maurice Sanders ([id number]) -------------------------------------------------------------------------------- Vitals Details Patient Name: Maurice Sanders Date of Service: 05/07/2015 8:00 AM Medical Record Number: [id number] Patient Account Number: [id number] Date of Birth/Sex: [birth year] (69 y.o. Male) Treating RN: Curtis Sites Primary Care Physician: Beverely Risen Other Clinician: Referring Physician: Beverely Risen Treating Physician/Extender: Rudene Re in Treatment: 43 Vital Signs Time Taken: 08:17 Temperature (F): 98.1 Height (in): 71 Pulse (bpm): 62 Weight (lbs): 291.7 Respiratory Rate (breaths/min): 18 Body Mass Index (BMI): 40.7 Blood Pressure (mmHg): 141/70 Reference Range: 80 - 120 mg / dl Electronic Signature(s) Signed: 05/07/2015 4:58:54 PM By: Curtis Sites Entered By: Curtis Sites on 05/07/2015 08:18:19

## 2015-05-08 NOTE — Progress Notes (Signed)
ZARIAH, JOST ([id number]) Visit Report for 05/07/2015 Chief Complaint Document Details Patient Name: Maurice Sanders, Maurice Sanders Date of Service: 05/07/2015 8:00 AM Medical Record Number: [id number] Patient Account Number: [id number] Date of Birth/Sex: 11/20/1945 (69 y.o. Male) Treating RN: Curtis Sites Primary Care Physician: Beverely Risen Other Clinician: Referring Physician: Beverely Risen Treating Physician/Extender: Rudene Re in Treatment: 39 Information Obtained from: Patient Chief Complaint Saadiq has been coming for dressing changes twice a week, on Monday and Thursday. Over all he is doing fine he has his blood sugar under control and is managing his diuretics well. after changing over from calamine to zinc oxide he said his own Unna's boot still gave him a bit of itching and problems and he had significant swelling which made the Unna's boot fairly tight. Electronic Signature(s) Signed: 05/07/2015 8:38:41 AM By: Evlyn Kanner MD, FACS Entered By: Evlyn Kanner on 05/07/2015 08:38:41 Maurice Sanders ([id number]) -------------------------------------------------------------------------------- HPI Details Patient Name: Maurice Sanders Date of Service: 05/07/2015 8:00 AM Medical Record Number: [id number] Patient Account Number: [id number] Date of Birth/Sex: 03-18-1946 (69 y.o. Male) Treating RN: Curtis Sites Primary Care Physician: Beverely Risen Other Clinician: Referring Physician: Beverely Risen Treating Physician/Extender: Rudene Re in Treatment: 11 History of Present Illness Location: right lower extremity Quality: Patient reports experiencing a dull pain to affected area(s). Severity: patient denies any severe pain. Duration: he has had this wound for a few weeks now. Timing: Pain in wound is Intermittent (comes and goes Context: the patient has a long history of venous insufficiency. He has been very compliant with wearing his compression stockings. Modifying Factors: the  patient normally wears compression stockings. Recently he had placed a fair amount of Vaseline which cause skin irritation. Associated Signs and Symptoms: Patient denies any fevers. He has had a moderate amount of drainage. HPI Description: The patient is 69 year-old male with a history of diabetes who presents with right lower extremity wound. He first presented to our clinic on 07/10/2014. He does have a history of venous insufficiency in the past. He has been very compliant with wearing his compression stockings. He recently Vaseline on his legs which caused skin irritation and breakdown. The patient returns for follow-up today. he denies any fevers at home. However due to the fact that he had stopped his diuretic he has had a lot of edema of the lower extremity. He also has a lot of scaly skin on his right lower extremity. 10/02/14 -- he has resumed his diuretics on a regular basis and has been walking around adequately and has overall improved. No pain or no discharge from the wounds. 10/09/14 -- the patient has been having some pain at night while sleeping and was requesting pain medications. He also says that due to taking his diuretics he's been getting some cramps and I have asked him to please see his PCP regarding both these problems. Also awaiting insurance clearance regarding his Apligraf. 10/16/2014. Vascular note reviewed in detail. I have been able to review his notes from Dr. Gilda Crease on 08/17/2014. 1.The venous duplex study done on 08/17/2014 showed that all veins visualized showed no evidence of DVT. 2. no incompetence of the greater small saphenous veins bilaterally. The plan was that he had no surgery or intervention needed at this point. The long discussion had showed that the patient had chronic skin changes accompanied by venous insufficiency and at this stage he would benefit from wearing graduated compression stockings of the 20-30 mmHg on a daily basis and review would  be done in  his office back again in 6 months. At that time the patient could have a lymph pump. 10/23/14 -- Culture report --- His wound culture has grown Citrobacter koseri and Enterococcus faecalis. Final report is back but so far sensitive to Bactrim and ampicillin and ciprofloxacin. Bactrim DS was called in for 14 days. Maurice Sanders, Maurice Sanders ([id number]) 11/13/2014 last week we changed his Unna boot on Monday and Thursday and this has helped him a lot. The edema has been much better the Unna boot hasn't slipped down and he has not had any skin abrasions. 11/20/2014 -- he saw the vascular surgeon Dr. Gilda Crease this morning and he was pleased with his progress and has said he would be ordering him some lymphedema pumps. Other than that the patient is coming to change his Unna's boots twice a week and he is doing well with this. 11/27/2014 -- over Saturday and Sunday he started having more swelling in his leg and some pain and I believe his own Unna's boot caused him some discomfort and excoriation of skin. He does admit he took some more juices orally but he has continued his diuretic. 12/11/2014 -- he has completed his course of antibiotics and has had no fresh issues. He has had no pain no swelling and is managing well with his blood sugars and diuretics. 12/14/2014 between Monday and today he has had some dietary indiscretions and had some [place] food which may have made him retain a lot of fluid. His edema has increased markedly and a lot of more excoriation. 12/25/2014 -- This week he has really done well and his edema is under control and these had no pain or problems. His leg is feeling good. 01/04/2015 -- He has done great along the way and he has managed to keep his wrap for almost her entire week. No issues or problems. 02/01/2015 -- he is doing fine and easier for his first application of the Apligraf skin substitute. 02/08/2015 -- he had a lot of drainage to the dressing over the Apligraf  and this will be addressed today. 02/15/2015 -- his wound looks very good and there was not much of drainage. He will have a second application of Apligraf. 02/22/2015 -- his Apligraf is in place and he has significant drainage. 02/27/2015 -- he is here for his third application of Apligraf 03/06/2015 -- he is doing well and is here for a wound check. He did have significant drainage this week too. 03/15/2015 -- he has done better with his wound and not had any reaction to the Profore 4-layer wrap. He does feel however that a small lace opened where the Steri-Strip was. He does not have an Apligraf ordered for him today. 03/21/2015 -- he is done fine and is here for his fourth application of Apligraf. 03/27/2015 -- he is here for a wound check and other than that has been tolerating his Profore 4-layer wrap. 04/05/2015 -- he has done fine and is here for his fifth application of Apligraf Electronic Signature(s) Signed: 05/07/2015 8:39:23 AM By: Evlyn Kanner MD, FACS Entered By: Evlyn Kanner on 05/07/2015 08:39:23 Maurice Sanders ([id number]) -------------------------------------------------------------------------------- Physical Exam Details Patient Name: Maurice Sanders Date of Service: 05/07/2015 8:00 AM Medical Record Number: [id number] Patient Account Number: [id number] Date of Birth/Sex: 1946-02-27 (69 y.o. Male) Treating RN: Curtis Sites Primary Care Physician: Beverely Risen Other Clinician: Referring Physician: Beverely Risen Treating Physician/Extender: Rudene Re in Treatment: 43 Constitutional . Pulse regular. Respirations normal and unlabored. Afebrile. . Eyes Nonicteric. Reactive to light.  Ears, Nose, Mouth, and Throat Lips, teeth, and gums WNL.Marland Kitchen Moist mucosa without lesions . Neck supple and nontender. No palpable supraclavicular or cervical adenopathy. Normal sized without goiter. Respiratory WNL. No retractions.. Cardiovascular Pedal Pulses WNL. No clubbing,  cyanosis or edema. Gastrointestinal (GI) Abdomen without masses or tenderness.. No liver or spleen enlargement or tenderness.. Lymphatic No adneopathy. No adenopathy. No adenopathy. Musculoskeletal Adexa without tenderness or enlargement.. Digits and nails w/o clubbing, cyanosis, infection, petechiae, ischemia, or inflammatory conditions.. Integumentary (Hair, Skin) No suspicious lesions. No crepitus or fluctuance. No peri-wound warmth or erythema. No masses.Marland Kitchen Psychiatric Judgement and insight Intact.. No evidence of depression, anxiety, or agitation.. Notes the wound on his right lower stomach is completely healed and but for some loose scaly skin everything else looks excellent and there is minimal edema. Electronic Signature(s) Signed: 05/07/2015 8:39:54 AM By: Evlyn Kanner MD, FACS Entered By: Evlyn Kanner on 05/07/2015 08:39:54 Maurice Sanders ([id number]) -------------------------------------------------------------------------------- Physician Orders Details Patient Name: Maurice Sanders Date of Service: 05/07/2015 8:00 AM Medical Record Number: [id number] Patient Account Number: [id number] Date of Birth/Sex: 09/26/1945 (69 y.o. Male) Treating RN: Curtis Sites Primary Care Physician: Beverely Risen Other Clinician: Referring Physician: Beverely Risen Treating Physician/Extender: Rudene Re in Treatment: 64 Verbal / Phone Orders: Yes Clinician: Curtis Sites Read Back and Verified: Yes Diagnosis Coding Discharge From [facility] Services o Discharge from Wound Care Center Electronic Signature(s) Signed: 05/07/2015 4:36:30 PM By: Evlyn Kanner MD, FACS Signed: 05/07/2015 4:58:54 PM By: Curtis Sites Entered By: Curtis Sites on 05/07/2015 08:33:19 Maurice Sanders ([id number]) -------------------------------------------------------------------------------- Problem List Details Patient Name: Maurice Sanders Date of Service: 05/07/2015 8:00 AM Medical Record Number:  [id number] Patient Account Number: [id number] Date of Birth/Sex: 23-Mar-1946 (69 y.o. 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 Male) Treating RN: Curtis Sites Primary Care Physician: Beverely Risen Other Clinician: Referring Physician: Beverely Risen Treating Physician/Extender: Rudene Re in Treatment: 43 Subjective Chief Complaint Information obtained from Patient Maurice Sanders has been coming for dressing changes twice a week, on Monday and Thursday. Over all he is doing fine he has his blood sugar under control and is managing his diuretics well. after changing over from calamine to zinc oxide he said his own Unna's boot still gave him a bit of itching and problems and he had significant swelling which made the Unna's boot fairly tight. History of Present Illness (HPI) The following HPI elements were documented for the patient's wound: Location: right lower extremity Quality: Patient reports experiencing a dull pain to affected area(s). Severity: patient  denies any severe pain. Duration: he has had this wound for a few weeks now. Timing: Pain in wound is Intermittent (comes and goes Context: the patient has a long history of venous insufficiency. He has been very compliant with wearing his compression stockings. Modifying Factors: the patient normally wears compression stockings. Recently he  had placed a fair amount of Vaseline which cause skin irritation. Associated Signs and Symptoms: Patient denies any fevers. He has had a moderate amount of drainage. The patient is 69 year-old male with a history of diabetes who presents with right lower extremity wound. He first presented to our clinic on 07/10/2014. He does have a history of venous insufficiency in the past. He has been very compliant with wearing his compression stockings. He recently Vaseline on his legs which caused skin irritation and breakdown. The patient returns for follow-up today. he denies any fevers at home. However due to the fact that he had stopped his diuretic he has had a lot of edema of the lower extremity. He also has a lot of scaly skin on his right lower extremity. 10/02/14 -- he has resumed his diuretics on a regular basis and has been walking around adequately and has overall improved. No pain or no discharge from the wounds. 10/09/14 -- the patient has been having some pain at night while sleeping and was requesting pain medications. He also says that due to taking his diuretics he's been getting some cramps and I have asked him to please see his PCP regarding both these problems. Also awaiting insurance clearance regarding his Apligraf. 10/16/2014. Vascular note reviewed in detail. I have been able to review his notes from Dr. Gilda Crease on 08/17/2014. 1.The venous duplex study done on 08/17/2014 showed that all veins visualized showed no evidence of DVT. Maurice Sanders, Maurice Sanders ([id number]) 2. no incompetence of the greater small saphenous veins bilaterally. The plan was  that he had no surgery or intervention needed at this point. The long discussion had showed that the patient had chronic skin changes accompanied by venous insufficiency and at this stage he would benefit from wearing graduated compression stockings of the 20-30 mmHg on a daily basis and review would be done in his office back again in 6 months. At that time the patient could have a lymph pump. 10/23/14 -- Culture report --- His wound culture has grown Citrobacter koseri and Enterococcus faecalis. Final report is back but so far sensitive to Bactrim and ampicillin and ciprofloxacin. Bactrim DS was called in for 14 days. 11/13/2014 last week we changed his Unna boot on Monday and Thursday and this has helped him a lot. The edema has been much better the Unna boot hasn't slipped down and he has not had any skin abrasions. 11/20/2014 -- he saw the vascular surgeon Dr. Gilda Crease this morning and he was pleased with his progress and has said he would be ordering him some lymphedema pumps. Other than that the patient is coming to change his Unna's boots twice a week and he is doing well with this. 11/27/2014 -- over Saturday and Sunday he started having more swelling in his leg and some pain and I believe his own Unna's boot caused him some discomfort and excoriation of skin. He does admit he took some more juices orally but he has continued his diuretic. 12/11/2014 -- he has completed his course of antibiotics and has had no fresh issues. He has had no pain no swelling and is managing well with his blood sugars and diuretics. 12/14/2014 between Monday and today he has had some dietary indiscretions and had some [place] food which may have made him retain a lot of fluid. His edema has increased markedly and a lot of more excoriation. 12/25/2014 -- This week he has really done well and his edema is under control and these had no  pain or problems. His leg is feeling good. 01/04/2015 -- He has done great  along the way and he has managed to keep his wrap for almost her entire week. No issues or problems. 02/01/2015 -- he is doing fine and easier for his first application of the Apligraf skin substitute. 02/08/2015 -- he had a lot of drainage to the dressing over the Apligraf and this will be addressed today. 02/15/2015 -- his wound looks very good and there was not much of drainage. He will have a second application of Apligraf. 02/22/2015 -- his Apligraf is in place and he has significant drainage. 02/27/2015 -- he is here for his third application of Apligraf 03/06/2015 -- he is doing well and is here for a wound check. He did have significant drainage this week too. 03/15/2015 -- he has done better with his wound and not had any reaction to the Profore 4-layer wrap. He does feel however that a small lace opened where the Steri-Strip was. He does not have an Apligraf ordered for him today. 03/21/2015 -- he is done fine and is here for his fourth application of Apligraf. 03/27/2015 -- he is here for a wound check and other than that has been tolerating his Profore 4-layer wrap. 04/05/2015 -- he has done fine and is here for his fifth application of Apligraf Maurice Sanders, Maurice Sanders ([id number]) Objective Constitutional Pulse regular. Respirations normal and unlabored. Afebrile. Vitals Time Taken: 8:17 AM, Height: 71 in, Weight: 291.7 lbs, BMI: 40.7, Temperature: 98.1 F, Pulse: 62 bpm, Respiratory Rate: 18 breaths/min, Blood Pressure: 141/70 mmHg. Eyes Nonicteric. Reactive to light. Ears, Nose, Mouth, and Throat Lips, teeth, and gums WNL.Marland Kitchen Moist mucosa without lesions . Neck supple and nontender. No palpable supraclavicular or cervical adenopathy. Normal sized without goiter. Respiratory WNL. No retractions.. Cardiovascular Pedal Pulses WNL. No clubbing, cyanosis or edema. Gastrointestinal (GI) Abdomen without masses or tenderness.. No liver or spleen enlargement or tenderness.. Lymphatic No  adneopathy. No adenopathy. No adenopathy. Musculoskeletal Adexa without tenderness or enlargement.. Digits and nails w/o clubbing, cyanosis, infection, petechiae, ischemia, or inflammatory conditions.Marland Kitchen Psychiatric Judgement and insight Intact.. No evidence of depression, anxiety, or agitation.. General Notes: the wound on his right lower stomach is completely healed and but for some loose scaly skin everything else looks excellent and there is minimal edema. Integumentary (Hair, Skin) No suspicious lesions. No crepitus or fluctuance. No peri-wound warmth or erythema. No masses.Marland Kitchen Maurice Sanders, Maurice Sanders ([id number]) Wound #2 status is Open. Original cause of wound was Gradually Appeared. The wound is located on the Right,Proximal,Medial Malleolus. The wound measures 0cm length x 0cm width x 0cm depth; 0cm^2 area and 0cm^3 volume. 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 All questions answered and he will be compliant. He is discharged from the wound care services and be seen back as needed. Plan Discharge From [facility] Services: Discharge from Wound Care Center The patient's wounds have completely healed and I have discussed the care of his lower extremity, the use of proper graduated compression stockings and the fact that he will always use this irrespective of whether he has an  ulcer or not. All questions answered and he will be compliant. He is discharged from the wound care services and be seen back as needed. Electronic Signature(s) Signed: 05/07/2015  8:40:44 AM By: Evlyn Kanner MD, FACS Entered By: Evlyn Kanner on 05/07/2015 08:40:44 Maurice Sanders ([id number]) -------------------------------------------------------------------------------- SuperBill Details Patient Name: Maurice Sanders Date of Service: 05/07/2015 Medical Record Number: [id number] Patient Account Number: [id number] Date of Birth/Sex: [birth year] (69 y.o. 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 ulcer of ankle Facility Procedures CPT4 Code: [id number] Description: 95284 - WOUND CARE VISIT-LEV 2 EST PT Modifier: Quantity: 1 Physician Procedures CPT4 Code Description: [id number] 99213 - WC PHYS LEVEL 3 - EST PT ICD-10 Description Diagnosis I87.311 Chronic venous hypertension (idiopathic) with ulcer I83.013 Varicose veins of right lower extremity with ulcer E11.620 Type 2 diabetes mellitus with  diabetic dermatitis Modifier: of right lower of ankle Quantity: 1 extremity Electronic Signature(s) Signed: 05/07/2015 8:41:01 AM By: Evlyn Kanner MD, FACS Entered By: Evlyn Kanner on 05/07/2015 08:41:01

## 2015-05-24 DIAGNOSIS — I6523 Occlusion and stenosis of bilateral carotid arteries: Secondary | ICD-10-CM | POA: Diagnosis not present

## 2015-05-24 DIAGNOSIS — I10 Essential (primary) hypertension: Secondary | ICD-10-CM | POA: Diagnosis not present

## 2015-05-24 DIAGNOSIS — R011 Cardiac murmur, unspecified: Secondary | ICD-10-CM | POA: Diagnosis not present

## 2015-05-24 DIAGNOSIS — M79671 Pain in right foot: Secondary | ICD-10-CM | POA: Diagnosis not present

## 2015-05-24 DIAGNOSIS — E119 Type 2 diabetes mellitus without complications: Secondary | ICD-10-CM | POA: Diagnosis not present

## 2015-05-28 ENCOUNTER — Telehealth: Payer: Self-pay

## 2015-05-28 NOTE — Telephone Encounter (Signed)
Patient checking to see if appt in office.

## 2015-06-01 DIAGNOSIS — G4733 Obstructive sleep apnea (adult) (pediatric): Secondary | ICD-10-CM | POA: Diagnosis not present

## 2015-06-18 DIAGNOSIS — I890 Lymphedema, not elsewhere classified: Secondary | ICD-10-CM | POA: Diagnosis not present

## 2015-06-18 DIAGNOSIS — M7989 Other specified soft tissue disorders: Secondary | ICD-10-CM | POA: Diagnosis not present

## 2015-06-18 DIAGNOSIS — M79609 Pain in unspecified limb: Secondary | ICD-10-CM | POA: Diagnosis not present

## 2015-06-18 DIAGNOSIS — I872 Venous insufficiency (chronic) (peripheral): Secondary | ICD-10-CM | POA: Diagnosis not present

## 2015-07-02 DIAGNOSIS — G4733 Obstructive sleep apnea (adult) (pediatric): Secondary | ICD-10-CM | POA: Diagnosis not present

## 2015-08-03 DIAGNOSIS — G4733 Obstructive sleep apnea (adult) (pediatric): Secondary | ICD-10-CM | POA: Diagnosis not present

## 2015-09-03 DIAGNOSIS — G4733 Obstructive sleep apnea (adult) (pediatric): Secondary | ICD-10-CM | POA: Diagnosis not present

## 2015-09-24 DIAGNOSIS — I10 Essential (primary) hypertension: Secondary | ICD-10-CM | POA: Diagnosis not present

## 2015-09-24 DIAGNOSIS — B359 Dermatophytosis, unspecified: Secondary | ICD-10-CM | POA: Diagnosis not present

## 2015-09-24 DIAGNOSIS — E782 Mixed hyperlipidemia: Secondary | ICD-10-CM | POA: Diagnosis not present

## 2015-09-24 DIAGNOSIS — R011 Cardiac murmur, unspecified: Secondary | ICD-10-CM | POA: Diagnosis not present

## 2015-09-24 DIAGNOSIS — E1165 Type 2 diabetes mellitus with hyperglycemia: Secondary | ICD-10-CM | POA: Diagnosis not present

## 2015-09-29 DIAGNOSIS — G4733 Obstructive sleep apnea (adult) (pediatric): Secondary | ICD-10-CM | POA: Diagnosis not present

## 2015-11-01 DIAGNOSIS — G4733 Obstructive sleep apnea (adult) (pediatric): Secondary | ICD-10-CM | POA: Diagnosis not present

## 2015-11-03 ENCOUNTER — Emergency Department
Admission: EM | Admit: 2015-11-03 | Discharge: 2015-11-03 | Disposition: A | Discharge status: Home / Self Care | Payer: Medicare Other | Attending: Emergency Medicine | Admitting: Emergency Medicine

## 2015-11-03 ENCOUNTER — Encounter: Payer: Self-pay | Admitting: Emergency Medicine

## 2015-11-03 DIAGNOSIS — H6091 Unspecified otitis externa, right ear: Secondary | ICD-10-CM | POA: Insufficient documentation

## 2015-11-03 DIAGNOSIS — H9201 Otalgia, right ear: Secondary | ICD-10-CM | POA: Diagnosis present

## 2015-11-03 MED ORDER — NEOMYCIN-POLYMYXIN-HC 3.5-10000-1 OT SOLN: 3.0000 [drp] | Freq: Three times a day (TID) | OTIC | Status: AC

## 2015-11-03 NOTE — ED Notes (Signed)
Discussed discharge instructions, prescriptions, and follow-up care with patient. No questions or concerns at this time. Pt stable at discharge.  

## 2015-11-03 NOTE — ED Notes (Signed)
Reports drainage from right ear

## 2015-11-03 NOTE — Discharge Instructions (Signed)
Otitis Externa Otitis externa is a germ infection in the outer ear. The outer ear is the area from the eardrum to the outside of the ear. Otitis externa is sometimes called "swimmer's ear." HOME CARE  Put drops in the ear as told by your doctor.  Only take medicine as told by your doctor.  If you have diabetes, your doctor may give you more directions. Follow your doctor's directions.  Keep all doctor visits as told. To avoid another infection:  Keep your ear dry. Use the corner of a towel to dry your ear after swimming or bathing.  Avoid scratching or putting things inside your ear.  Avoid swimming in lakes, dirty water, or pools that use a chemical called chlorine poorly.  You may use ear drops after swimming. Combine equal amounts of white vinegar and alcohol in a bottle. Put 3 or 4 drops in each ear. GET HELP IF:   You have a fever.  Your ear is still red, puffy (swollen), or painful after 3 days.  You still have yellowish-white fluid (pus) coming from the ear after 3 days.  Your redness, puffiness, or pain gets worse.  You have a really bad headache.  You have redness, puffiness, pain, or tenderness behind your ear. MAKE SURE YOU:   Understand these instructions.  Will watch your condition.  Will get help right away if you are not doing well or get worse.   This information is not intended to replace advice given to you by your health care provider. Make sure you discuss any questions you have with your health care provider.   Document Released: 01/07/2008 Document Revised: 08/11/2014 Document Reviewed: 08/07/2011 Elsevier Interactive Patient Education [employer]   Follow-up with your doctor if any continued problems or [facility] if any severe worsening of your ear problem. Begin using eardrops as directed.

## 2015-11-03 NOTE — ED Provider Notes (Signed)
[facility] Emergency Department Provider Note ____________________________________________  Time seen: Approximately 4:13 PM  I have reviewed the triage vital signs and the nursing notes.   HISTORY  Chief Complaint Otalgia   HPI Maurice Sanders is a 70 y.o. male is here with complaint of right ear drainage for a couple days. Patient states that initially drainage did have an odor to it that now is just a clear thin drainage that is mostly when he is noticing at night. He denies any fever or chills. He denies any actual ear pain. He states that he has been using over-the-counter eardrops without any relief. Currently he rates his pain a 1/10.   History reviewed. No pertinent past medical history.  There are no active problems to display for this patient.   History reviewed. No pertinent past surgical history.  Current Outpatient Rx  Name  Route  Sig  Dispense  Refill  . neomycin-polymyxin-hydrocortisone (CORTISPORIN) otic solution   Right Ear   Place 3 drops into the right ear 3 (three) times daily.   10 mL   0     Allergies Review of patient's allergies indicates no known allergies.  No family history on file.  Social History Social History  Substance Use Topics  . Smoking status: Never Smoker   . Smokeless tobacco: None  . Alcohol Use: None    Review of Systems Constitutional: No fever/chills ENT: Right ear pain. Negative sore throat. Cardiovascular: Denies chest pain. Respiratory: Denies shortness of breath. Skin: Negative for rash. Neurological: Negative for headaches  10-point ROS otherwise negative.  ____________________________________________   PHYSICAL EXAM:  VITAL SIGNS: ED Triage Vitals  Enc Vitals Group     BP 11/03/15 1545 147/57 mmHg     Pulse Rate 11/03/15 1545 86     Resp 11/03/15 1545 18     Temp 11/03/15 1545 98 F (36.7 C)     Temp Source 11/03/15 1545 Oral     SpO2 11/03/15 1545 98 %     Weight 11/03/15  1545 267 lb (121.11 kg)     Height 11/03/15 1545  (1.803 m)     Head Cir --      Peak Flow --      Pain Score 11/03/15 1545 1     Pain Loc --      Pain Edu? --      Excl. in GC? --     Constitutional: Alert and oriented. Well appearing and in no acute distress. Eyes: Conjunctivae are normal. PERRL. EOMI. Head: Atraumatic. Nose: No congestion/rhinnorhea.  Left EAC and TM are clear. Right EAC with mild white exudate present. Canal is minimally edematous and with minimal tenderness on examination. TM is within normal limits without erythema. Mouth/Throat: Mucous membranes are moist.  Oropharynx non-erythematous. Neck: No stridor.   Hematological/Lymphatic/Immunilogical: No cervical lymphadenopathy. Cardiovascular: Normal rate, regular rhythm. Grossly normal heart sounds.  Good peripheral circulation. Respiratory: Normal respiratory effort.  No retractions. Lungs CTAB. Neurologic:  Normal speech and language. No gross focal neurologic deficits are appreciated. No gait instability. Skin:  Skin is warm, dry and intact. No rash noted. Psychiatric: Mood and affect are normal. Speech and behavior are normal.  ____________________________________________   LABS (all labs ordered are listed, but only abnormal results are displayed)  Labs Reviewed - No data to display   PROCEDURES  Procedure(s) performed: None  Critical Care performed: No  ____________________________________________   INITIAL IMPRESSION / ASSESSMENT AND PLAN / ED COURSE  Pertinent labs &  imaging results that were available during my care of the patient were reviewed by me and considered in my medical decision making (see chart for details).  She was given a prescription for Cortisporin otic suspension 3 drops 3 times a day to right ear. He is to follow-up with his primary care doctor or Dr. Elenore [facility]otaJuengel at [facility] if any continued problems. ____________________________________________   FINAL CLINICAL  IMPRESSION(S) / ED DIAGNOSES  Final diagnoses:  Otitis externa, right      Tommi RumpsRhonda L Kaedyn Polivka, PA-C 11/03/15 1652  Jennye MoccasinBrian S Quigley, MD 11/03/15 [phone]

## 2015-12-01 DIAGNOSIS — G4733 Obstructive sleep apnea (adult) (pediatric): Secondary | ICD-10-CM | POA: Diagnosis not present

## 2016-01-01 DIAGNOSIS — G4733 Obstructive sleep apnea (adult) (pediatric): Secondary | ICD-10-CM | POA: Diagnosis not present

## 2016-01-31 DIAGNOSIS — G4733 Obstructive sleep apnea (adult) (pediatric): Secondary | ICD-10-CM | POA: Diagnosis not present

## 2016-02-01 DIAGNOSIS — E1165 Type 2 diabetes mellitus with hyperglycemia: Secondary | ICD-10-CM | POA: Diagnosis not present

## 2016-02-01 DIAGNOSIS — Z0001 Encounter for general adult medical examination with abnormal findings: Secondary | ICD-10-CM | POA: Diagnosis not present

## 2016-02-01 DIAGNOSIS — E782 Mixed hyperlipidemia: Secondary | ICD-10-CM | POA: Diagnosis not present

## 2016-02-01 DIAGNOSIS — R011 Cardiac murmur, unspecified: Secondary | ICD-10-CM | POA: Diagnosis not present

## 2016-02-01 DIAGNOSIS — I10 Essential (primary) hypertension: Secondary | ICD-10-CM | POA: Diagnosis not present

## 2016-02-19 DIAGNOSIS — I10 Essential (primary) hypertension: Secondary | ICD-10-CM | POA: Diagnosis not present

## 2016-02-19 DIAGNOSIS — E1165 Type 2 diabetes mellitus with hyperglycemia: Secondary | ICD-10-CM | POA: Diagnosis not present

## 2016-02-19 DIAGNOSIS — E782 Mixed hyperlipidemia: Secondary | ICD-10-CM | POA: Diagnosis not present

## 2016-02-19 DIAGNOSIS — R5383 Other fatigue: Secondary | ICD-10-CM | POA: Diagnosis not present

## 2016-02-19 DIAGNOSIS — R972 Elevated prostate specific antigen [PSA]: Secondary | ICD-10-CM | POA: Diagnosis not present

## 2016-02-19 DIAGNOSIS — D691 Qualitative platelet defects: Secondary | ICD-10-CM | POA: Diagnosis not present

## 2016-02-19 DIAGNOSIS — Z0001 Encounter for general adult medical examination with abnormal findings: Secondary | ICD-10-CM | POA: Diagnosis not present

## 2016-02-19 DIAGNOSIS — E079 Disorder of thyroid, unspecified: Secondary | ICD-10-CM | POA: Diagnosis not present

## 2016-02-19 DIAGNOSIS — Z1159 Encounter for screening for other viral diseases: Secondary | ICD-10-CM | POA: Diagnosis not present

## 2016-03-01 DIAGNOSIS — G4733 Obstructive sleep apnea (adult) (pediatric): Secondary | ICD-10-CM | POA: Diagnosis not present

## 2016-03-31 DIAGNOSIS — G4733 Obstructive sleep apnea (adult) (pediatric): Secondary | ICD-10-CM | POA: Diagnosis not present

## 2016-05-01 DIAGNOSIS — G4733 Obstructive sleep apnea (adult) (pediatric): Secondary | ICD-10-CM | POA: Diagnosis not present

## 2016-05-26 DIAGNOSIS — I10 Essential (primary) hypertension: Secondary | ICD-10-CM | POA: Diagnosis not present

## 2016-05-26 DIAGNOSIS — E782 Mixed hyperlipidemia: Secondary | ICD-10-CM | POA: Diagnosis not present

## 2016-05-26 DIAGNOSIS — R011 Cardiac murmur, unspecified: Secondary | ICD-10-CM | POA: Diagnosis not present

## 2016-05-26 DIAGNOSIS — E1165 Type 2 diabetes mellitus with hyperglycemia: Secondary | ICD-10-CM | POA: Diagnosis not present

## 2016-06-02 DIAGNOSIS — G4733 Obstructive sleep apnea (adult) (pediatric): Secondary | ICD-10-CM | POA: Diagnosis not present

## 2016-07-02 DIAGNOSIS — G4733 Obstructive sleep apnea (adult) (pediatric): Secondary | ICD-10-CM | POA: Diagnosis not present

## 2016-07-03 ENCOUNTER — Ambulatory Visit (INDEPENDENT_AMBULATORY_CARE_PROVIDER_SITE_OTHER): Payer: Medicare Other | Admitting: Vascular Surgery

## 2016-07-03 ENCOUNTER — Encounter (INDEPENDENT_AMBULATORY_CARE_PROVIDER_SITE_OTHER): Payer: Self-pay | Admitting: Vascular Surgery

## 2016-07-03 DIAGNOSIS — E119 Type 2 diabetes mellitus without complications: Secondary | ICD-10-CM

## 2016-07-03 DIAGNOSIS — I890 Lymphedema, not elsewhere classified: Secondary | ICD-10-CM | POA: Diagnosis not present

## 2016-07-03 DIAGNOSIS — I10 Essential (primary) hypertension: Secondary | ICD-10-CM | POA: Diagnosis not present

## 2016-07-03 DIAGNOSIS — Z794 Long term (current) use of insulin: Secondary | ICD-10-CM

## 2016-07-03 DIAGNOSIS — I872 Venous insufficiency (chronic) (peripheral): Secondary | ICD-10-CM

## 2016-07-03 DIAGNOSIS — M7989 Other specified soft tissue disorders: Secondary | ICD-10-CM | POA: Diagnosis not present

## 2016-07-03 DIAGNOSIS — E782 Mixed hyperlipidemia: Secondary | ICD-10-CM | POA: Diagnosis not present

## 2016-07-03 DIAGNOSIS — E785 Hyperlipidemia, unspecified: Secondary | ICD-10-CM | POA: Insufficient documentation

## 2016-07-03 NOTE — Progress Notes (Signed)
MRN : [id number]  Maurice Sanders is a 70 y.o. (03/08/1946) male who presents with chief complaint of  Chief Complaint  Patient presents with  . Re-evaluation    1 year follow up, no studies  .  History of Present Illness: The patient returns to the office for followup evaluation regarding leg swelling.  The swelling has persisted but with the lymph pump is much, much better controlled. The pain associated with swelling is essentially eliminated. There have not been any interval development of a ulcerations or wounds.  The patient denies problems with the pump, noting it is working well and the leggings are in good condition.  Since the previous visit the patient has been wearing graduated compression stockings and using the lymph pump on a routine basis and  has noted significant improvement in the lymphedema.   Patient stated the lymph pump has been a very positive factor in her care.    Current Meds  Medication Sig  . aspirin EC 81 MG tablet Take by mouth.  Marland Kitchen. atorvastatin (LIPITOR) 10 MG tablet Take by mouth.  . furosemide (LASIX) 40 MG tablet Take by mouth.  Marland Kitchen. glimepiride (AMARYL) 2 MG tablet TK 1 T PO D WITH LARGEST MEAL OF THE DAY FOR DIABETES  . lisinopril (PRINIVIL,ZESTRIL) 10 MG tablet TK 1 T PO D FOR BP  . sitaGLIPtin-metformin (JANUMET) 50-1000 MG tablet Take by mouth.    Past Medical History:  Diagnosis Date  . Diabetes mellitus without complication (HCC)   . Hyperlipidemia   . Hypertension     History reviewed. No pertinent surgical history.  Social History Social History  Substance Use Topics  . Smoking status: Never Smoker  . Smokeless tobacco: Never Used  . Alcohol use Not on file    Family History Family History  Problem Relation Age of Onset  . Diabetes Mother   . Varicose Veins Mother   No family history of bleeding/clotting disorders, porphyria or autoimmune disease   No Known Allergies   REVIEW OF SYSTEMS (Negative unless  checked)  Constitutional: [] Weight loss  [] Fever  [] Chills Cardiac: [] Chest pain   [] Chest pressure   [] Palpitations   [] Shortness of breath when laying flat   [] Shortness of breath with exertion. Vascular:  [x] Pain in legs with walking   [] Pain in legs at rest  [] History of DVT   [] Phlebitis   [] Swelling in legs   [] Varicose veins   [] Non-healing ulcers Pulmonary:   [] Uses home oxygen   [] Productive cough   [] Hemoptysis   [] Wheeze  [] COPD   [] Asthma Neurologic:  [] Dizziness   [] Seizures   [] History of stroke   [] History of TIA  [] Aphasia   [] Vissual changes   [] Weakness or numbness in arm   [] Weakness or numbness in leg Musculoskeletal:   [] Joint swelling   [] Joint pain   [] Low back pain Hematologic:  [] Easy bruising  [] Easy bleeding   [] Hypercoagulable state   [] Anemic Gastrointestinal:  [] Diarrhea   [] Vomiting  [] Gastroesophageal reflux/heartburn   [] Difficulty swallowing. Genitourinary:  [] Chronic kidney disease   [] Difficult urination  [] Frequent urination   [] Blood in urine Skin:  [] Rashes   [] Ulcers  Psychological:  [] History of anxiety   []  History of major depression.  Physical Examination  Vitals:   07/03/16 0830  BP: (!) 151/74  Pulse: 70  Resp: 16  Weight: 280 lb (127 kg)  Height: 5\' 11"  (1.803 m)   Body mass index is 39.05 kg/m. Gen: WD/WN, NAD Head: [place]/AT, No temporalis  wasting.  Ear/Nose/Throat: Hearing grossly intact, nares w/o erythema or drainage, poor dentition Eyes: PER, EOMI, sclera nonicteric.  Neck: Supple, no masses.  No bruit or JVD.  Pulmonary:  Good air movement, clear to auscultation bilaterally, no use of accessory muscles.  Cardiac: RRR, normal S1, S2, no Murmurs. Vascular: 1+ edema bilaterally patient is wearing his graduated  Compression stockings. No ulcers or open wounds Vessel Right Left  Radial Palpable Palpable  Ulnar Palpable Palpable  Brachial Palpable Palpable  Carotid Palpable Palpable  Femoral Palpable Palpable  Popliteal Palpable  Palpable  PT Palpable Palpable  DP Palpable Palpable   Gastrointestinal: soft, non-distended. No guarding/no peritoneal signs.  Musculoskeletal: M/S 5/5 throughout.  No deformity or atrophy.  Neurologic: CN 2-12 intact. Pain and light touch intact in extremities.  Symmetrical.  Speech is fluent. Motor exam as listed above. Psychiatric: Judgment intact, Mood & affect appropriate for pt's clinical situation. Dermatologic: No rashes or ulcers noted.  No changes consistent with cellulitis. Lymph : No Cervical lymphadenopathy, no lichenification or skin changes of chronic lymphedema.  CBC Lab Results  Component Value Date   WBC 8.0 08/30/2014   HGB 12.5 (L) 08/30/2014   HCT 40.0 08/30/2014   MCV 76 (L) 08/30/2014   PLT 239 08/30/2014    BMET    Component Value Date/Time   NA 138 08/30/2014 1201   K 3.5 08/30/2014 1201   CL 103 08/30/2014 1201   CO2 27 08/30/2014 1201   GLUCOSE 198 (H) 08/30/2014 1201   BUN 12 08/30/2014 1201   CREATININE 0.96 08/30/2014 1201   CALCIUM 8.7 08/30/2014 1201   GFRNONAA >60 08/30/2014 1201   GFRAA >60 08/30/2014 1201   CrCl cannot be calculated (Patient's most recent lab result is older than the maximum 21 days allowed.).  COAG Lab Results  Component Value Date   INR 1.0 08/30/2014    Radiology No results found.    Assessment/Plan 1. Lymphedema  No surgery or intervention at this point in time.    I have reviewed my discussion with the patient regarding lymphedema and why it  causes symptoms.  Patient will continue wearing graduated compression stockings class 1 (20-30 mmHg) on a daily basis a prescription was given. The patient will  beginning wearing the stockings first thing in the morning and removing them in the evening. The patient is instructed specifically not to sleep in the stockings.   In addition, behavioral modification throughout the day will be continued.  This will include frequent elevation (such as in a recliner), use of  over the counter pain medications as needed and exercise such as walking.  I have reviewed systemic causes for chronic edema such as liver, kidney and cardiac etiologies and there does not appear to be any significant changes in these organ systems over the past year.  The patient is under the impression that these organ systems are all stable and unchanged.    The patient will continue aggressive use of the  lymph pump.  This will continue to improve the edema control and prevent sequela such as ulcers and infections.   The patient will follow-up with me on an annual basis.    2. Swelling of limb See #1  3. Chronic venous insufficiency See #1  4. Type 2 diabetes mellitus treated with insulin (HCC) Continue hypoglycemic medications as already ordered and reviewed, no changes at this time.  5. Essential hypertension Continue antihypertensive medications as already ordered and reviewed, no changes at this time.  6. Mixed hyperlipidemia Continue statin as ordered and reviewed, no changes at this time    Levora DredgeGregory Layni Kreamer, MD  07/03/2016 10:36 AM

## 2016-08-01 DIAGNOSIS — G4733 Obstructive sleep apnea (adult) (pediatric): Secondary | ICD-10-CM | POA: Diagnosis not present

## 2016-09-23 DIAGNOSIS — I10 Essential (primary) hypertension: Secondary | ICD-10-CM | POA: Diagnosis not present

## 2016-09-23 DIAGNOSIS — M150 Primary generalized (osteo)arthritis: Secondary | ICD-10-CM | POA: Diagnosis not present

## 2016-09-23 DIAGNOSIS — R011 Cardiac murmur, unspecified: Secondary | ICD-10-CM | POA: Diagnosis not present

## 2016-09-23 DIAGNOSIS — E1165 Type 2 diabetes mellitus with hyperglycemia: Secondary | ICD-10-CM | POA: Diagnosis not present

## 2016-09-29 DIAGNOSIS — G4733 Obstructive sleep apnea (adult) (pediatric): Secondary | ICD-10-CM | POA: Diagnosis not present

## 2016-10-30 DIAGNOSIS — G4733 Obstructive sleep apnea (adult) (pediatric): Secondary | ICD-10-CM | POA: Diagnosis not present

## 2016-11-29 DIAGNOSIS — G4733 Obstructive sleep apnea (adult) (pediatric): Secondary | ICD-10-CM | POA: Diagnosis not present

## 2016-12-31 DIAGNOSIS — G4733 Obstructive sleep apnea (adult) (pediatric): Secondary | ICD-10-CM | POA: Diagnosis not present

## 2017-01-26 DIAGNOSIS — E1165 Type 2 diabetes mellitus with hyperglycemia: Secondary | ICD-10-CM | POA: Diagnosis not present

## 2017-01-26 DIAGNOSIS — E782 Mixed hyperlipidemia: Secondary | ICD-10-CM | POA: Diagnosis not present

## 2017-01-26 DIAGNOSIS — I10 Essential (primary) hypertension: Secondary | ICD-10-CM | POA: Diagnosis not present

## 2017-01-26 DIAGNOSIS — R011 Cardiac murmur, unspecified: Secondary | ICD-10-CM | POA: Diagnosis not present

## 2017-01-26 DIAGNOSIS — M25552 Pain in left hip: Secondary | ICD-10-CM | POA: Diagnosis not present

## 2017-01-30 DIAGNOSIS — G4733 Obstructive sleep apnea (adult) (pediatric): Secondary | ICD-10-CM | POA: Diagnosis not present

## 2017-03-02 DIAGNOSIS — G4733 Obstructive sleep apnea (adult) (pediatric): Secondary | ICD-10-CM | POA: Diagnosis not present

## 2017-03-25 DIAGNOSIS — E119 Type 2 diabetes mellitus without complications: Secondary | ICD-10-CM | POA: Diagnosis not present

## 2017-04-01 DIAGNOSIS — G4733 Obstructive sleep apnea (adult) (pediatric): Secondary | ICD-10-CM | POA: Diagnosis not present

## 2017-04-29 DIAGNOSIS — I10 Essential (primary) hypertension: Secondary | ICD-10-CM | POA: Diagnosis not present

## 2017-04-29 DIAGNOSIS — E1165 Type 2 diabetes mellitus with hyperglycemia: Secondary | ICD-10-CM | POA: Diagnosis not present

## 2017-04-29 DIAGNOSIS — K210 Gastro-esophageal reflux disease with esophagitis: Secondary | ICD-10-CM | POA: Diagnosis not present

## 2017-05-01 ENCOUNTER — Other Ambulatory Visit: Payer: Self-pay | Admitting: Nurse Practitioner

## 2017-05-01 DIAGNOSIS — K219 Gastro-esophageal reflux disease without esophagitis: Secondary | ICD-10-CM

## 2017-05-01 DIAGNOSIS — G4733 Obstructive sleep apnea (adult) (pediatric): Secondary | ICD-10-CM | POA: Diagnosis not present

## 2017-05-06 ENCOUNTER — Ambulatory Visit
Admission: RE | Admit: 2017-05-06 | Discharge: 2017-05-06 | Disposition: A | Discharge status: Home / Self Care | Payer: Medicare Other | Source: Ambulatory Visit | Attending: Nurse Practitioner | Admitting: Nurse Practitioner

## 2017-05-06 DIAGNOSIS — K219 Gastro-esophageal reflux disease without esophagitis: Secondary | ICD-10-CM

## 2017-06-02 DIAGNOSIS — G4733 Obstructive sleep apnea (adult) (pediatric): Secondary | ICD-10-CM | POA: Diagnosis not present

## 2017-06-08 DIAGNOSIS — I10 Essential (primary) hypertension: Secondary | ICD-10-CM | POA: Diagnosis not present

## 2017-06-08 DIAGNOSIS — E782 Mixed hyperlipidemia: Secondary | ICD-10-CM | POA: Diagnosis not present

## 2017-06-08 DIAGNOSIS — K210 Gastro-esophageal reflux disease with esophagitis: Secondary | ICD-10-CM | POA: Diagnosis not present

## 2017-06-08 DIAGNOSIS — Z0001 Encounter for general adult medical examination with abnormal findings: Secondary | ICD-10-CM | POA: Diagnosis not present

## 2017-06-08 DIAGNOSIS — E1165 Type 2 diabetes mellitus with hyperglycemia: Secondary | ICD-10-CM | POA: Diagnosis not present

## 2017-07-02 DIAGNOSIS — G4733 Obstructive sleep apnea (adult) (pediatric): Secondary | ICD-10-CM | POA: Diagnosis not present

## 2017-07-30 ENCOUNTER — Other Ambulatory Visit: Payer: Self-pay | Admitting: Nurse Practitioner

## 2017-07-30 DIAGNOSIS — M064 Inflammatory polyarthropathy: Secondary | ICD-10-CM

## 2017-07-30 MED ORDER — OXYCODONE-ACETAMINOPHEN 5-325 MG PO TABS: 1.0000 | ORAL_TABLET | Freq: Four times a day (QID) | ORAL | 0 refills | Status: DC | PRN

## 2017-07-30 NOTE — Progress Notes (Signed)
Sent in new rx for oxycodone/APAP 5/325mg  every 6 hours prn severe pain. rx for #30 sent to pharmacy

## 2017-08-01 DIAGNOSIS — G4733 Obstructive sleep apnea (adult) (pediatric): Secondary | ICD-10-CM | POA: Diagnosis not present

## 2017-08-31 DIAGNOSIS — G4733 Obstructive sleep apnea (adult) (pediatric): Secondary | ICD-10-CM | POA: Diagnosis not present

## 2017-09-30 DIAGNOSIS — G4733 Obstructive sleep apnea (adult) (pediatric): Secondary | ICD-10-CM | POA: Diagnosis not present

## 2017-10-08 ENCOUNTER — Ambulatory Visit: Payer: Self-pay | Admitting: Nurse Practitioner

## 2017-10-30 DIAGNOSIS — G4733 Obstructive sleep apnea (adult) (pediatric): Secondary | ICD-10-CM | POA: Diagnosis not present

## 2017-11-24 ENCOUNTER — Encounter: Payer: Self-pay | Admitting: Nurse Practitioner

## 2017-11-24 ENCOUNTER — Ambulatory Visit (INDEPENDENT_AMBULATORY_CARE_PROVIDER_SITE_OTHER): Payer: Medicare Other | Admitting: Nurse Practitioner

## 2017-11-24 VITALS — BP 148/84 | HR 100 | Resp 16 | Ht 71.5 in | Wt 271.0 lb

## 2017-11-24 DIAGNOSIS — K219 Gastro-esophageal reflux disease without esophagitis: Secondary | ICD-10-CM | POA: Diagnosis not present

## 2017-11-24 DIAGNOSIS — I10 Essential (primary) hypertension: Secondary | ICD-10-CM

## 2017-11-24 DIAGNOSIS — G4733 Obstructive sleep apnea (adult) (pediatric): Secondary | ICD-10-CM | POA: Diagnosis not present

## 2017-11-24 DIAGNOSIS — R0602 Shortness of breath: Secondary | ICD-10-CM | POA: Diagnosis not present

## 2017-11-24 DIAGNOSIS — M064 Inflammatory polyarthropathy: Secondary | ICD-10-CM

## 2017-11-24 DIAGNOSIS — E1165 Type 2 diabetes mellitus with hyperglycemia: Secondary | ICD-10-CM

## 2017-11-24 DIAGNOSIS — K449 Diaphragmatic hernia without obstruction or gangrene: Secondary | ICD-10-CM

## 2017-11-24 LAB — POCT GLYCOSYLATED HEMOGLOBIN (HGB A1C): HEMOGLOBIN A1C: 6.5

## 2017-11-24 NOTE — Progress Notes (Signed)
[facility] [address] [place], [place] [address]  Internal MEDICINE  Office Visit Note  Patient Name: Maurice Sanders  [id number]  [id number]  Date of Service: 11/25/2017  Chief Complaint  Patient presents with  . Cough  . Shortness of Breath  . Diabetes    Shortness of Breath  This is a new problem. The problem occurs daily. The problem has been waxing and waning. Associated symptoms include orthopnea and wheezing. Pertinent negatives include no chest pain, headaches, leg swelling or rash. The symptoms are aggravated by exercise, URIs and weather changes. The patient has no known risk factors for DVT/PE. His past medical history is significant for CAD and a heart failure.  Diabetes  He presents for his follow-up diabetic visit. He has type 2 diabetes mellitus. No MedicAlert identification noted. His disease course has been improving. Hypoglycemia symptoms include nervousness/anxiousness. Pertinent negatives for hypoglycemia include no dizziness or headaches. Associated symptoms include fatigue. Pertinent negatives for diabetes include no chest pain and no weakness. There are no hypoglycemic complications. Symptoms are improving. Risk factors for coronary artery disease include hypertension, dyslipidemia, diabetes mellitus and family history. Current diabetic treatment includes oral agent (triple therapy). He is compliant with treatment all of the time. His weight is stable. He is following a generally healthy diet. Meal planning includes avoidance of concentrated sweets. He has not had a previous visit with a dietitian. He participates in exercise intermittently. His home blood glucose trend is decreasing steadily. An ACE inhibitor/angiotensin II receptor blocker is being taken. He does not see a podiatrist.Eye exam is not current.    Pt is here for routine follow up.    Current Medication: Outpatient Encounter Medications as of 11/24/2017  Medication Sig Note  . aspirin EC  81 MG tablet Take by mouth. 07/03/2016: Received from: [facility] Received Sig: Take 81 mg by mouth once daily.  Marland Kitchen atorvastatin (LIPITOR) 10 MG tablet Take by mouth. 07/03/2016: Received from: [facility] Received Sig: Take 10 mg by mouth once daily.  . furosemide (LASIX) 40 MG tablet Take by mouth. 07/03/2016: Received from: [facility] Received Sig: Take 40 mg by mouth once daily.  Marland Kitchen glimepiride (AMARYL) 2 MG tablet TK 1 T PO D WITH LARGEST MEAL OF THE DAY FOR DIABETES 07/03/2016: Received from: External Pharmacy  . lisinopril (PRINIVIL,ZESTRIL) 10 MG tablet TK 1 T PO D FOR BP 07/03/2016: Received from: External Pharmacy  . omeprazole (PRILOSEC) 40 MG capsule Take 40 mg by mouth daily.   Marland Kitchen oxyCODONE-acetaminophen (ROXICET) 5-325 MG tablet Take 1 tablet by mouth every 6 (six) hours as needed for severe pain.   . sitaGLIPtin-metformin (JANUMET) 50-1000 MG tablet Take by mouth. 07/03/2016: Received from: [facility] Received Sig: Take 1 tablet by mouth 2 (two) times daily with meals.  . [DISCONTINUED] oxyCODONE-acetaminophen (ROXICET) 5-325 MG tablet Take 1 tablet by mouth every 6 (six) hours as needed for severe pain.   . clotrimazole-betamethasone (LOTRISONE) cream Apply topically.    No facility-administered encounter medications on file as of 11/24/2017.     Surgical History: History reviewed. No pertinent surgical history.  Medical History: Past Medical History:  Diagnosis Date  . Diabetes mellitus without complication (HCC)   . Hyperlipidemia   . Hypertension     Family History: Family History  Problem Relation Age of Onset  . Diabetes Mother   . Varicose Veins Mother     Social History   Socioeconomic History  .  Marital status: Married    Spouse name: Not on file  . Number of children: Not on file  . Years of education: Not on file  . Highest education level: Not on file  Occupational History  . Not  on file  Social Needs  . Financial resource strain: Not on file  . Food insecurity:    Worry: Not on file    Inability: Not on file  . Transportation needs:    Medical: Not on file    Non-medical: Not on file  Tobacco Use  . Smoking status: Never Smoker  . Smokeless tobacco: Never Used  Substance and Sexual Activity  . Alcohol use: Never    Frequency: Never  . Drug use: Never  . Sexual activity: Not on file  Lifestyle  . Physical activity:    Days per week: Not on file    Minutes per session: Not on file  . Stress: Not on file  Relationships  . Social connections:    Talks on phone: Not on file    Gets together: Not on file    Attends religious service: Not on file    Active member of club or organization: Not on file    Attends meetings of clubs or organizations: Not on file    Relationship status: Not on file  . Intimate partner violence:    Fear of current or ex partner: Not on file    Emotionally abused: Not on file    Physically abused: Not on file    Forced sexual activity: Not on file  Other Topics Concern  . Not on file  Social History Narrative  . Not on file      Review of Systems  Constitutional: Positive for activity change and fatigue.  HENT: Positive for congestion and postnasal drip.   Eyes: Negative.   Respiratory: Positive for apnea, chest tightness, shortness of breath and wheezing.   Cardiovascular: Positive for orthopnea. Negative for chest pain, palpitations and leg swelling.  Gastrointestinal:       History of hiatal hernia and GERD.  Endocrine:       Blood sugars doing well   Genitourinary: Negative.   Musculoskeletal: Positive for back pain and myalgias.  Skin: Negative for rash.  Allergic/Immunologic: Positive for environmental allergies.  Neurological: Negative for dizziness, weakness and headaches.  Psychiatric/Behavioral: The patient is nervous/anxious.    Today's Vitals   11/24/17 1408  BP: (!) 148/84  Pulse: 100  Resp: 16   SpO2: 94%  Weight: 271 lb (122.9 kg)  Height: 5' 11.5" (1.816 m)    Physical Exam  Constitutional: He is oriented to person, place, and time. He appears well-developed and well-nourished. No distress.  HENT:  Head: Normocephalic and atraumatic.  Mouth/Throat: Oropharynx is clear and moist. No oropharyngeal exudate.  Eyes: Pupils are equal, round, and reactive to light. EOM are normal.  Neck: Normal range of motion. Neck supple. No JVD present. Carotid bruit is not present. No tracheal deviation present. No thyromegaly present.  Cardiovascular: Normal rate and regular rhythm. Exam reveals no gallop and no friction rub.  Murmur heard. Grade 3/4 systolic murmur auscultated.   Pulmonary/Chest: Effort normal and breath sounds normal. No respiratory distress. He has no wheezes. He has no rales. He exhibits no tenderness.  Abdominal: Soft. Bowel sounds are normal. There is tenderness.  Mild tenderness acros the epigastric area.   Musculoskeletal: Normal range of motion.  Lymphadenopathy:    He has no cervical adenopathy.  Neurological: He  is alert and oriented to person, place, and time. No cranial nerve deficit.  Skin: Skin is warm and dry. He is not diaphoretic.  Psychiatric: He has a normal mood and affect. His behavior is normal. Judgment and thought content normal.  Nursing note and vitals reviewed.   Assessment/Plan:  1. Uncontrolled type 2 diabetes mellitus with hyperglycemia (HCC) - POCT HgB A1C 6.5 today. Continue diabetic medications as prescribed anddietary lifestyle changes.   2. Hiatal hernia with GERD Worsening. Will get UGI with barium for further evaluation.  - DG UGI W/SMALL BOWEL; Future  3. Gastroesophageal reflux disease without esophagitis Continue omeprazole 40mg  as needed   4. Shortness of breath Concern for worsening hiatal hernia causing increased shortness of breath. Will get UGI with barium for further evaluation. Will   5. Obstructive sleep  apnea Currently using CPAP - Ambulatory referral to Pulmonology  6. Essential hypertension Generally stable. Continue bp medication as prescribed.   General Counseling: dimitrious micciche understanding of the findings of todays visit and agrees with plan of treatment. I have discussed any further diagnostic evaluation that may be needed or ordered today. We also reviewed his medications today. he has been encouraged to call the office with any questions or concerns that should arise related to todays visit.   Diabetes Counseling:  1. Addition of ACE inh/ ARB'S for nephroprotection. 2. Diabetic foot care, prevention of complications.  3.Exercise and lose weight.  4. Diabetic eye examination, 5. Monitor blood sugar closlely. nutrition counseling.  6.Sign and symptoms of hypoglycemia including shaking sweating,confusion and headaches.  This patient was seen by Vincent Gros, FNP- C in Collaboration with Dr Lyndon Code as a part of collaborative care agreement    Orders Placed This Encounter  Procedures  . DG UGI W/SMALL BOWEL  . Ambulatory referral to Pulmonology  . POCT HgB A1C      Time spent: 25 Minutes          Dr Lyndon Code Internal medicine

## 2017-11-25 ENCOUNTER — Other Ambulatory Visit: Payer: Self-pay | Admitting: Nurse Practitioner

## 2017-11-25 DIAGNOSIS — R0602 Shortness of breath: Secondary | ICD-10-CM | POA: Diagnosis not present

## 2017-11-25 DIAGNOSIS — E1165 Type 2 diabetes mellitus with hyperglycemia: Secondary | ICD-10-CM | POA: Diagnosis not present

## 2017-11-25 DIAGNOSIS — K219 Gastro-esophageal reflux disease without esophagitis: Secondary | ICD-10-CM | POA: Insufficient documentation

## 2017-11-25 DIAGNOSIS — Z125 Encounter for screening for malignant neoplasm of prostate: Secondary | ICD-10-CM | POA: Diagnosis not present

## 2017-11-25 DIAGNOSIS — E782 Mixed hyperlipidemia: Secondary | ICD-10-CM | POA: Diagnosis not present

## 2017-11-25 MED ORDER — OXYCODONE-ACETAMINOPHEN 5-325 MG PO TABS: 1.0000 | ORAL_TABLET | Freq: Four times a day (QID) | ORAL | 0 refills | Status: AC | PRN

## 2017-11-26 LAB — CBC
Hematocrit: 36.3 % — ABNORMAL LOW (ref 37.5–51.0)
Hemoglobin: 11.4 g/dL — ABNORMAL LOW (ref 13.0–17.7)
MCH: 24.2 pg — AB (ref 26.6–33.0)
MCHC: 31.4 g/dL — AB (ref 31.5–35.7)
MCV: 77 fL — ABNORMAL LOW (ref 79–97)
Platelets: 328 10*3/uL (ref 150–379)
RBC: 4.71 x10E6/uL (ref 4.14–5.80)
RDW: 18 % — AB (ref 12.3–15.4)
WBC: 6.9 10*3/uL (ref 3.4–10.8)

## 2017-11-26 LAB — COMPREHENSIVE METABOLIC PANEL
A/G RATIO: 1.3 (ref 1.2–2.2)
ALBUMIN: 4.3 g/dL (ref 3.5–4.8)
ALT: 17 IU/L (ref 0–44)
AST: 15 IU/L (ref 0–40)
Alkaline Phosphatase: 95 IU/L (ref 39–117)
BILIRUBIN TOTAL: 0.9 mg/dL (ref 0.0–1.2)
BUN / CREAT RATIO: 14 (ref 10–24)
BUN: 15 mg/dL (ref 8–27)
CHLORIDE: 101 mmol/L (ref 96–106)
CO2: 22 mmol/L (ref 20–29)
Calcium: 9.4 mg/dL (ref 8.6–10.2)
Creatinine, Ser: 1.06 mg/dL (ref 0.76–1.27)
GFR calc Af Amer: 80 mL/min/{1.73_m2} (ref >59)
GFR calc non Af Amer: 69 mL/min/{1.73_m2} (ref >59)
GLOBULIN, TOTAL: 3.3 g/dL (ref 1.5–4.5)
Glucose: 131 mg/dL — ABNORMAL HIGH (ref 65–99)
POTASSIUM: 4 mmol/L (ref 3.5–5.2)
Sodium: 139 mmol/L (ref 134–144)
Total Protein: 7.6 g/dL (ref 6.0–8.5)

## 2017-11-26 LAB — LIPID PANEL W/O CHOL/HDL RATIO
CHOLESTEROL TOTAL: 115 mg/dL (ref 100–199)
HDL: 45 mg/dL (ref >39)
LDL Calculated: 62 mg/dL (ref 0–99)
TRIGLYCERIDES: 41 mg/dL (ref 0–149)
VLDL Cholesterol Cal: 8 mg/dL (ref 5–40)

## 2017-11-26 LAB — T4, FREE: Free T4: 1.31 ng/dL (ref 0.82–1.77)

## 2017-11-26 LAB — MICROALBUMIN, URINE: Microalbumin, Urine: 83.1 ug/mL

## 2017-11-26 LAB — TSH: TSH: 3.31 u[IU]/mL (ref 0.450–4.500)

## 2017-11-26 LAB — PSA: Prostate Specific Ag, Serum: 0.9 ng/mL (ref 0.0–4.0)

## 2017-11-26 LAB — BRAIN NATRIURETIC PEPTIDE: BNP: 156.5 pg/mL — AB (ref 0.0–100.0)

## 2017-11-29 DIAGNOSIS — G4733 Obstructive sleep apnea (adult) (pediatric): Secondary | ICD-10-CM | POA: Diagnosis not present

## 2017-12-01 ENCOUNTER — Ambulatory Visit
Admission: RE | Admit: 2017-12-01 | Discharge: 2017-12-01 | Disposition: A | Discharge status: Home / Self Care | Payer: Medicare Other | Source: Ambulatory Visit | Attending: Nurse Practitioner | Admitting: Nurse Practitioner

## 2017-12-01 DIAGNOSIS — K449 Diaphragmatic hernia without obstruction or gangrene: Secondary | ICD-10-CM | POA: Diagnosis not present

## 2017-12-01 DIAGNOSIS — K219 Gastro-esophageal reflux disease without esophagitis: Secondary | ICD-10-CM

## 2017-12-07 ENCOUNTER — Encounter: Payer: Self-pay | Admitting: Nurse Practitioner

## 2017-12-07 ENCOUNTER — Ambulatory Visit (INDEPENDENT_AMBULATORY_CARE_PROVIDER_SITE_OTHER): Payer: Medicare Other | Admitting: Nurse Practitioner

## 2017-12-07 VITALS — BP 152/87 | HR 107 | Temp 97.3°F | Resp 18 | Ht 71.5 in | Wt 273.8 lb

## 2017-12-07 DIAGNOSIS — I10 Essential (primary) hypertension: Secondary | ICD-10-CM

## 2017-12-07 DIAGNOSIS — E1165 Type 2 diabetes mellitus with hyperglycemia: Secondary | ICD-10-CM | POA: Diagnosis not present

## 2017-12-07 DIAGNOSIS — K449 Diaphragmatic hernia without obstruction or gangrene: Secondary | ICD-10-CM | POA: Diagnosis not present

## 2017-12-07 DIAGNOSIS — K219 Gastro-esophageal reflux disease without esophagitis: Secondary | ICD-10-CM

## 2017-12-07 DIAGNOSIS — R0602 Shortness of breath: Secondary | ICD-10-CM | POA: Diagnosis not present

## 2017-12-07 DIAGNOSIS — E1143 Type 2 diabetes mellitus with diabetic autonomic (poly)neuropathy: Secondary | ICD-10-CM | POA: Diagnosis not present

## 2017-12-07 MED ORDER — METOCLOPRAMIDE HCL 10 MG PO TABS: 10.0000 mg | ORAL_TABLET | Freq: Three times a day (TID) | ORAL | 2 refills | Status: AC | PRN

## 2017-12-07 NOTE — Progress Notes (Signed)
[facility] [address] [place], [place] [address]  Internal MEDICINE  Office Visit Note  Patient Name: Maurice Sanders  [id number]  [id number]  Date of Service: 12/28/2017    Pt is here for a sick visit.  Chief Complaint  Patient presents with  . Abdominal Pain    nausea     The patient is c/o nausea and epigastric pain. Has been getting much worse since his last visit. Has had decreased appetite due to symptoms. Discomfort is so severe, causing him to feel short of breath, especially with the least amount of exertion. He did have UGI and some labs done prior to this visit. UGI did show GER and a very small hiatal hernia. His lab work showed mild elevation of BNP.       Current Medication:  Outpatient Encounter Medications as of 12/07/2017  Medication Sig Note  . aspirin EC 81 MG tablet Take by mouth. 07/03/2016: Received from: [facility] Received Sig: Take 81 mg by mouth once daily.  Marland Kitchen atorvastatin (LIPITOR) 10 MG tablet Take by mouth. 07/03/2016: Received from: [facility] Received Sig: Take 10 mg by mouth once daily.  . clotrimazole-betamethasone (LOTRISONE) cream Apply topically.   . furosemide (LASIX) 40 MG tablet Take by mouth. 07/03/2016: Received from: [facility] Received Sig: Take 40 mg by mouth once daily.  Marland Kitchen glimepiride (AMARYL) 2 MG tablet TK 1 T PO D WITH LARGEST MEAL OF THE DAY FOR DIABETES 07/03/2016: Received from: External Pharmacy  . lisinopril (PRINIVIL,ZESTRIL) 10 MG tablet TK 1 T PO D FOR BP 07/03/2016: Received from: External Pharmacy  . metoCLOPramide (REGLAN) 10 MG tablet Take 1 tablet (10 mg total) by mouth every 8 (eight) hours as needed for nausea.   Marland Kitchen omeprazole (PRILOSEC) 40 MG capsule Take 40 mg by mouth daily.   Marland Kitchen oxyCODONE-acetaminophen (ROXICET) 5-325 MG tablet Take 1 tablet by mouth every 6 (six) hours as needed for severe pain.   . sitaGLIPtin-metformin (JANUMET) 50-1000 MG  tablet Take by mouth. 07/03/2016: Received from: [facility] Received Sig: Take 1 tablet by mouth 2 (two) times daily with meals.   No facility-administered encounter medications on file as of 12/07/2017.       Medical History: Past Medical History:  Diagnosis Date  . Diabetes mellitus without complication (HCC)   . Hyperlipidemia   . Hypertension     Today's Vitals   12/07/17 1131  BP: (!) 152/87  Pulse: (!) 107  Resp: 18  Temp: (!) 97.3 F (36.3 C)  TempSrc: Oral  SpO2: (!) 89%  Weight: 273 lb 12.8 oz (124.2 kg)  Height: 5' 11.5" (1.816 m)    Review of Systems  Constitutional: Positive for activity change, appetite change and fatigue.  HENT: Negative for congestion and postnasal drip.   Eyes: Negative.   Respiratory: Positive for apnea, chest tightness, shortness of breath and wheezing.   Cardiovascular: Negative for chest pain, palpitations and leg swelling.       Elevated blood pressure.   Gastrointestinal: Positive for nausea.       History of hiatal hernia and GERD.  Endocrine:       Blood sugars doing well   Genitourinary: Negative.   Musculoskeletal: Positive for back pain and myalgias.  Skin: Negative for rash.  Allergic/Immunologic: Positive for environmental allergies.  Neurological: Negative for dizziness, weakness and headaches.  Hematological: Negative for adenopathy.  Psychiatric/Behavioral: The patient is nervous/anxious.     Physical  Exam  Constitutional: He is oriented to person, place, and time. He appears well-developed and well-nourished. No distress.  HENT:  Head: Normocephalic and atraumatic.  Mouth/Throat: Oropharynx is clear and moist. No oropharyngeal exudate.  Eyes: Pupils are equal, round, and reactive to light. EOM are normal.  Neck: Normal range of motion. Neck supple. No JVD present. Carotid bruit is not present. No tracheal deviation present. No thyromegaly present.  Cardiovascular: Normal rate and regular rhythm.  Exam reveals no gallop and no friction rub.  Murmur heard. Grade 3/4 systolic murmur auscultated.   Pulmonary/Chest: Effort normal and breath sounds normal. No respiratory distress. He has no wheezes. He has no rales. He exhibits no tenderness.  Abdominal: Soft. Bowel sounds are normal. There is tenderness.  Mild tenderness acros the epigastric area.   Musculoskeletal: Normal range of motion.  Lymphadenopathy:    He has no cervical adenopathy.  Neurological: He is alert and oriented to person, place, and time. No cranial nerve deficit.  Skin: Skin is warm and dry. He is not diaphoretic.  Psychiatric: He has a normal mood and affect. His behavior is normal. Judgment and thought content normal.  Nursing note and vitals reviewed.  Assessment/Plan: 1. Diabetes mellitus with gastroparesis (HCC) Reviewed UGI with the patient. Did show slow small bowel transit and very small hiatl hernia. Add reglan  up to three times daily, prior to meals.  - metoCLOPramide (REGLAN) 10 MG tablet; Take 1 tablet (10 mg total) by mouth every 8 (eight) hours as needed for nausea.  Dispense: 90 tablet; Refill: 2  2. Shortness of breath Chest x-ray and echocardiogram ordered for evaluation of shortness of breath.  - DG Chest 2 View; Future - ECHOCARDIOGRAM COMPLETE; Future  3. Hiatal hernia with GERD Very small hiatal nernia noted on UGI with barium. Not likely cause of new symptoms. Will monitor.   4. Essential hypertension Stable. Continue bp medication as prescribed.     General Counseling: aaronjames kelsay understanding of the findings of todays visit and agrees with plan of treatment. I have discussed any further diagnostic evaluation that may be needed or ordered today. We also reviewed his medications today. he has been encouraged to call the office with any questions or concerns that should arise related to todays visit.  This patient was seen by Vincent Gros, FNP- C in Collaboration with Dr  Lyndon Code as a part of collaborative care agreement    Orders Placed This Encounter  Procedures  . DG Chest 2 View  . ECHOCARDIOGRAM COMPLETE    Meds ordered this encounter  Medications  . metoCLOPramide (REGLAN) 10 MG tablet    Sig: Take 1 tablet (10 mg total) by mouth every 8 (eight) hours as needed for nausea.    Dispense:  90 tablet    Refill:  2    Order Specific Question:   Supervising Provider    Answer:   Lyndon Code [[id number]]    Time spent: 15 Minutes

## 2017-12-08 DIAGNOSIS — E1143 Type 2 diabetes mellitus with diabetic autonomic (poly)neuropathy: Secondary | ICD-10-CM | POA: Insufficient documentation

## 2017-12-09 ENCOUNTER — Emergency Department: Payer: Medicare Other

## 2017-12-09 ENCOUNTER — Other Ambulatory Visit: Payer: Self-pay

## 2017-12-09 ENCOUNTER — Emergency Department
Admission: EM | Admit: 2017-12-09 | Discharge: 2017-12-09 | Disposition: A | Discharge status: Home / Self Care | Payer: Medicare Other | Attending: Emergency Medicine | Admitting: Emergency Medicine

## 2017-12-09 ENCOUNTER — Encounter: Payer: Self-pay | Admitting: Emergency Medicine

## 2017-12-09 DIAGNOSIS — J189 Pneumonia, unspecified organism: Secondary | ICD-10-CM | POA: Insufficient documentation

## 2017-12-09 DIAGNOSIS — Z79899 Other long term (current) drug therapy: Secondary | ICD-10-CM | POA: Diagnosis not present

## 2017-12-09 DIAGNOSIS — Z7982 Long term (current) use of aspirin: Secondary | ICD-10-CM | POA: Diagnosis not present

## 2017-12-09 DIAGNOSIS — E119 Type 2 diabetes mellitus without complications: Secondary | ICD-10-CM | POA: Diagnosis not present

## 2017-12-09 DIAGNOSIS — J90 Pleural effusion, not elsewhere classified: Secondary | ICD-10-CM | POA: Diagnosis not present

## 2017-12-09 DIAGNOSIS — Z7984 Long term (current) use of oral hypoglycemic drugs: Secondary | ICD-10-CM | POA: Diagnosis not present

## 2017-12-09 DIAGNOSIS — R05 Cough: Secondary | ICD-10-CM | POA: Diagnosis not present

## 2017-12-09 DIAGNOSIS — I10 Essential (primary) hypertension: Secondary | ICD-10-CM | POA: Diagnosis not present

## 2017-12-09 DIAGNOSIS — R0602 Shortness of breath: Secondary | ICD-10-CM | POA: Diagnosis not present

## 2017-12-09 DIAGNOSIS — R011 Cardiac murmur, unspecified: Secondary | ICD-10-CM | POA: Diagnosis not present

## 2017-12-09 LAB — CBC
HCT: 36.1 % — ABNORMAL LOW (ref 40.0–52.0)
Hemoglobin: 11.7 g/dL — ABNORMAL LOW (ref 13.0–18.0)
MCH: 25.1 pg — ABNORMAL LOW (ref 26.0–34.0)
MCHC: 32.3 g/dL (ref 32.0–36.0)
MCV: 77.8 fL — AB (ref 80.0–100.0)
PLATELETS: 299 10*3/uL (ref 150–440)
RBC: 4.64 MIL/uL (ref 4.40–5.90)
RDW: 19.7 % — AB (ref 11.5–14.5)
WBC: 6.3 10*3/uL (ref 3.8–10.6)

## 2017-12-09 LAB — COMPREHENSIVE METABOLIC PANEL
ALK PHOS: 78 U/L (ref 38–126)
ALT: 24 U/L (ref 17–63)
AST: 22 U/L (ref 15–41)
Albumin: 3.8 g/dL (ref 3.5–5.0)
Anion gap: 7 (ref 5–15)
BILIRUBIN TOTAL: 1.1 mg/dL (ref 0.3–1.2)
BUN: 19 mg/dL (ref 6–20)
CALCIUM: 8.8 mg/dL — AB (ref 8.9–10.3)
CHLORIDE: 103 mmol/L (ref 101–111)
CO2: 25 mmol/L (ref 22–32)
CREATININE: 0.92 mg/dL (ref 0.61–1.24)
Glucose, Bld: 178 mg/dL — ABNORMAL HIGH (ref 65–99)
Potassium: 3.7 mmol/L (ref 3.5–5.1)
Sodium: 135 mmol/L (ref 135–145)
TOTAL PROTEIN: 7.6 g/dL (ref 6.5–8.1)

## 2017-12-09 LAB — LIPASE, BLOOD: LIPASE: 32 U/L (ref 11–51)

## 2017-12-09 LAB — TROPONIN I

## 2017-12-09 MED ORDER — DOXYCYCLINE HYCLATE 100 MG PO CAPS
100.0000 mg | ORAL_CAPSULE | Freq: Two times a day (BID) | ORAL | 0 refills | Status: AC
Start: 2017-12-09 — End: 2017-12-16

## 2017-12-09 NOTE — ED Provider Notes (Signed)
[facility] Emergency Department Provider Note  ____________________________________________   I have reviewed the triage vital signs and the nursing notes.   HISTORY  Chief Complaint Shortness of breath  History limited by: Not Limited   HPI Maurice Sanders is a 72 y.o. male who presents to the emergency department today chief complaint of shortness of breath.  He states this is been happening for the past couple weeks.  It is worse with exertion.  He states he gets a sensation that something is moving up from his stomach.  This is what causes that shortness of breath.  Will last for a couple of minutes and then go away.  He has seen his primary care doctor about this and so far is undergone a barium swallow.  This showed perhaps some GERD so was put on medication for this which he states is not significantly helped.  Patient denies any chest pain or abdominal pain.  He has had a slight cough with phlegm.  Denies similar symptoms in the past.   Per medical record review patient has a history of DM, HLD, HTN.  Past Medical History:  Diagnosis Date  . Diabetes mellitus without complication (HCC)   . Hyperlipidemia   . Hypertension     Patient Active Problem List   Diagnosis Date Noted  . Diabetes mellitus with gastroparesis (HCC) 12/08/2017  . Gastroesophageal reflux disease without esophagitis 11/25/2017  . Uncontrolled type 2 diabetes mellitus with hyperglycemia (HCC) 11/24/2017  . Obstructive sleep apnea 11/24/2017  . Shortness of breath 11/24/2017  . Hiatal hernia with GERD 11/24/2017  . Lymphedema 07/03/2016  . Swelling of limb 07/03/2016  . Chronic venous insufficiency 07/03/2016  . Type 2 diabetes mellitus treated with insulin (HCC) 07/03/2016  . Essential hypertension 07/03/2016  . Hyperlipidemia 07/03/2016    History reviewed. No pertinent surgical history.  Prior to Admission medications   Medication Sig Start Date End Date Taking?  Authorizing Provider  aspirin EC 81 MG tablet Take by mouth.    [provider]  atorvastatin (LIPITOR) 10 MG tablet Take by mouth.    [provider]  clotrimazole-betamethasone (LOTRISONE) cream Apply topically.    [provider]  furosemide (LASIX) 40 MG tablet Take by mouth.    [provider]  glimepiride (AMARYL) 2 MG tablet TK 1 T PO D WITH LARGEST MEAL OF THE DAY FOR DIABETES 04/12/16   [provider]  lisinopril (PRINIVIL,ZESTRIL) 10 MG tablet TK 1 T PO D FOR BP 04/12/16   [provider]  metoCLOPramide (REGLAN) 10 MG tablet Take 1 tablet (10 mg total) by mouth every 8 (eight) hours as needed for nausea. 12/07/17   Carlean Jews, NP  omeprazole (PRILOSEC) 40 MG capsule Take 40 mg by mouth daily.    [provider]  oxyCODONE-acetaminophen (ROXICET) 5-325 MG tablet Take 1 tablet by mouth every 6 (six) hours as needed for severe pain. 11/25/17   Carlean Jews, NP  sitaGLIPtin-metformin (JANUMET) 50-1000 MG tablet Take by mouth.    [provider]    Allergies Patient has no known allergies.  Family History  Problem Relation Age of Onset  . Diabetes Mother   . Varicose Veins Mother     Social History Social History   Tobacco Use  . Smoking status: Never Smoker  . Smokeless tobacco: Never Used  Substance Use Topics  . Alcohol use: Never    Frequency: Never  . Drug use: Never    Review  of Systems Constitutional: No fever/chills Eyes: No visual changes. ENT: No sore throat. Cardiovascular: Denies chest pain. Respiratory: Positive for shortness of breath Gastrointestinal: No abdominal pain.   Genitourinary: Negative for dysuria. Musculoskeletal: Negative for back pain. Skin: Negative for rash. Neurological: Negative for headaches, focal weakness or numbness.  ____________________________________________   PHYSICAL EXAM:  VITAL SIGNS: ED Triage Vitals  Enc Vitals Group     BP 12/09/17  1023 (!) 146/69     Pulse Rate 12/09/17 1023 99     Resp 12/09/17 1023 18     Temp 12/09/17 1023 97.9 F (36.6 C)     Temp Source 12/09/17 1023 Oral     SpO2 12/09/17 1023 91 %     Weight --      Height --      Head Circumference --      Peak Flow --      Pain Score 12/09/17 1026 3    Constitutional: Alert and oriented. Well appearing and in no distress. Eyes: Conjunctivae are normal.  ENT   Head: Normocephalic and atraumatic.   Nose: No congestion/rhinnorhea.   Mouth/Throat: Mucous membranes are moist.   Neck: No stridor. Hematological/Lymphatic/Immunilogical: No cervical lymphadenopathy. Cardiovascular: Normal rate, regular rhythm.  Positive systolic murmur.  Respiratory: Normal respiratory effort without tachypnea nor retractions. Breath sounds are clear and equal bilaterally. No wheezes/rales/rhonchi. Gastrointestinal: Soft and non tender. No rebound. No guarding.  Genitourinary: Deferred Musculoskeletal: Normal range of motion in all extremities. No lower extremity edema. Neurologic:  Normal speech and language. No gross focal neurologic deficits are appreciated.  Skin:  Skin is warm, dry and intact. No rash noted. Psychiatric: Mood and affect are normal. Speech and behavior are normal. Patient exhibits appropriate insight and judgment.  ____________________________________________    LABS (pertinent positives/negatives)  Lipase 32 CMP wnl except glu 178, ca 8.8 CBC wbc 6.3, hgb 11.7, plt 299 Trop <0.03 ____________________________________________   EKG  I, Phineas Semen, attending physician, personally viewed and interpreted this EKG  EKG Time: 1030 Rate: 88 Rhythm: sinus rhythm with 1st degree av block Axis: normal Intervals: qtc 527 QRS: RBBB ST changes: no st elevation Impression: abnormal ekg   ____________________________________________    RADIOLOGY  CXR Concern for  pneumonia  ____________________________________________   PROCEDURES  Procedures  ____________________________________________   INITIAL IMPRESSION / ASSESSMENT AND PLAN / ED COURSE  Pertinent labs & imaging results that were available during my care of the patient were reviewed by me and considered in my medical decision making (see chart for details).  Patient presented to the emergency department today because of concerns for shortness of breath.  This has been going on for a couple of weeks.  Differential would be broad including anemia, pneumonia, acute coronary syndrome amongst other etiologies.  Work-up is most consistent with pneumonia.  Will plan on giving prescription for antibiotics.  Discussed findings and plan with patient.  ____________________________________________   FINAL CLINICAL IMPRESSION(S) / ED DIAGNOSES  Final diagnoses:  Community acquired pneumonia, unspecified laterality  Shortness of breath     Note: This dictation was prepared with Office manager. Any transcriptional errors that result from this process are unintentional     Phineas Semen, MD 12/09/17 1606

## 2017-12-09 NOTE — ED Notes (Signed)
AAOx3.  Skin warm and dry.  NAD 

## 2017-12-09 NOTE — ED Triage Notes (Signed)
Pt to ED via POV c/o abdominal pain and nausea x 2 weeks. Pt states that he has been taking medication for acid reflux but it is not getting any better. Pt denies vomiting. Pt states that he has had some diarrhea. Pt is in NAD at this time.

## 2017-12-09 NOTE — Discharge Instructions (Addendum)
Please seek medical attention for any high fevers, chest pain, shortness of breath, change in behavior, persistent vomiting, bloody stool or any other new or concerning symptoms.  

## 2017-12-09 NOTE — ED Notes (Signed)
First Nurse Note:  Patient complaining of nausea and "breathlessness".  Declines WC.  Alert and oriented.

## 2017-12-17 ENCOUNTER — Encounter: Payer: Self-pay | Admitting: Internal Medicine

## 2017-12-17 ENCOUNTER — Ambulatory Visit (INDEPENDENT_AMBULATORY_CARE_PROVIDER_SITE_OTHER): Payer: Medicare Other | Admitting: Internal Medicine

## 2017-12-17 VITALS — BP 130/80 | HR 90 | Resp 16 | Ht 71.5 in | Wt 275.0 lb

## 2017-12-17 DIAGNOSIS — R0602 Shortness of breath: Secondary | ICD-10-CM | POA: Diagnosis not present

## 2017-12-17 DIAGNOSIS — G4733 Obstructive sleep apnea (adult) (pediatric): Secondary | ICD-10-CM

## 2017-12-17 DIAGNOSIS — Z9989 Dependence on other enabling machines and devices: Secondary | ICD-10-CM | POA: Diagnosis not present

## 2017-12-17 DIAGNOSIS — J181 Lobar pneumonia, unspecified organism: Secondary | ICD-10-CM | POA: Diagnosis not present

## 2017-12-17 DIAGNOSIS — J90 Pleural effusion, not elsewhere classified: Secondary | ICD-10-CM | POA: Diagnosis not present

## 2017-12-17 NOTE — Patient Instructions (Signed)
Pneumonitis  Pneumonitis is inflammation of the lungs. Infection or exposure to certain substances or allergens can cause this condition. Allergens are substances that you are allergic to.  What are the causes?  This condition may be caused by:  · An infection from bacteria or a virus (pneumonia).  · Exposure to certain substances in the workplace. This includes working on farms and in certain industries. Some substances that can cause this condition include asbestos, silica, inhaled acids, or inhaled chlorine gas.  · Repeated exposure to bird feathers, bird feces, or other allergens.  · Medicines such as chemotherapy drugs, certain antibiotic medicines, and some heart medicines.  · Radiation therapy.  · Exposure to mold. A hot tub, sauna, or home humidifier can have mold growing in it, even if it looks clean. You can breathe in the mold through water vapor.  · Breathing in (aspirating) stomach contents, food, or liquids into the lungs.    What are the signs or symptoms?  Symptoms of this condition include:  · Shortness of breath or trouble breathing. This is the most common symptom.  · Cough.  · Fever.  · Decreased energy.  · Decreased appetite.    How is this diagnosed?  This condition may be diagnosed based on:  · Your medical history.  · Physical exam.  · Blood tests.  · Other tests, including:  ? Pulmonary function test (PFT). This measures how well your lungs work.  ? Chest X-ray.  ? CT scan of the lungs.  ? Bronchoscopy. In this procedure, your health care provider looks at your airways through an instrument called a bronchoscope.  ? Lung biopsy. In this procedure, your health care provider takes a small piece of tissue from your lungs to examine it.    How is this treated?  Treatment depends on the cause of the condition. If the cause is exposure to a substance, avoiding further exposure to that substance will help reduce your symptoms. Possible medical treatments for pneumonitis include:  · Corticosteroid  medicine to help decrease inflammation.  · Antibiotic medicine to help fight an infection caused by bacteria.  · Bronchodilators or inhalers to help relax the muscles and make breathing easier.  · Oxygen therapy, if you are having trouble breathing.    Follow these instructions at home:  · Take or use over-the-counter and prescription medicines only as told by your health care provider. This includes any inhaler use.  · Avoid exposure to any substance that caused your pneumonitis. If you need to work with substances that can cause pneumonitis, wear a mask to protect your lungs.  · If you were prescribed an antibiotic, take it as told by your health care provider. Do not stop taking the antibiotic even if you start to feel better.  · If you were prescribed an inhaler, keep it with you at all times.  · Do not use any products that contain nicotine or tobacco, such as cigarettes and e-cigarettes. If you need help quitting, ask your health care provider.  · Keep all follow-up visits as told by your health care provider. This is important.  Contact a health care provider if:  · You have a fever.  · Your symptoms get worse.  Get help right away if:  · You have new or worse shortness of breath.  · You develop a blue color (cyanosis) under your fingernails.  Summary  · Pneumonitis is inflammation of the lungs. This condition can be caused by infection or exposure   to certain substances or allergens.  · The most common symptom of this condition is shortness of breath or trouble breathing.  · Treatment depends on the cause of your condition.  This information is not intended to replace advice given to you by your health care provider. Make sure you discuss any questions you have with your health care provider.  Document Released: 01/08/2010 Document Revised: 06/12/2016 Document Reviewed: 06/12/2016  [employer] Interactive Patient Education © 2017 [employer]

## 2017-12-17 NOTE — Progress Notes (Signed)
[facility] [place], [place] [address]  Pulmonary Sleep Medicine   Office Visit Note  Patient Name: Maurice Sanders DOB: 06-23-1946 MRN [id number]  Date of Service: 12/17/2017  Complaints/HPI: Patient states that he was diagnosed with pneumonia in the emergency department few weeks ago.  He states that he was having some shortness of breath and coughing has no energy.  He was seen in the office given antibiotics but did not seem to improve.  He went had a chest x-ray done on May 8 which showed a left-sided effusion.  Patient states that he has completed his antibiotics but he is just not back to his normal self.  He states he is having some shortness of breath when he exerts himself.  He currently states that he has no hemoptysis.  Denies any fevers or chills just generalized fatigue.  Patient states that he has not had a follow-up film done.  He is a former smoker quit over 30 years ago.  Say said he has been getting along fairly well otherwise other than this recent episode of pneumonia.  ROS  General: (-) fever, (-) chills, (-) night sweats, (-) weakness Skin: (-) rashes, (-) itching,. Eyes: (-) visual changes, (-) redness, (-) itching. Nose and Sinuses: (-) nasal stuffiness or itchiness, (-) postnasal drip, (-) nosebleeds, (-) sinus trouble. Mouth and Throat: (-) sore throat, (-) hoarseness. Neck: (-) swollen glands, (-) enlarged thyroid, (-) neck pain. Respiratory: + cough, (-) bloody sputum, + shortness of breath, + wheezing. Cardiovascular: - ankle swelling, (-) chest pain. Lymphatic: (-) lymph node enlargement. Neurologic: (-) numbness, (-) tingling. Psychiatric: (-) anxiety, (-) depression   Current Medication: Outpatient Encounter Medications as of 12/17/2017  Medication Sig Note  . aspirin EC 81 MG tablet Take by mouth. 07/03/2016: Received from: [facility]: Take 81 mg by mouth once daily.  Marland Kitchen atorvastatin (LIPITOR) 10  MG tablet Take by mouth. 07/03/2016: Received from: [facility]: Take 10 mg by mouth once daily.  . clotrimazole-betamethasone (LOTRISONE) cream Apply topically.   . furosemide (LASIX) 40 MG tablet Take by mouth. 07/03/2016: Received from: [facility]: Take 40 mg by mouth once daily.  Marland Kitchen glimepiride (AMARYL) 2 MG tablet TK 1 T PO D WITH LARGEST MEAL OF THE DAY FOR DIABETES 07/03/2016: Received from: External Pharmacy  . lisinopril (PRINIVIL,ZESTRIL) 10 MG tablet TK 1 T PO D FOR BP 07/03/2016: Received from: External Pharmacy  . metoCLOPramide (REGLAN) 10 MG tablet Take 1 tablet (10 mg total) by mouth every 8 (eight) hours as needed for nausea.   Marland Kitchen omeprazole (PRILOSEC) 40 MG capsule Take 40 mg by mouth daily.   Marland Kitchen oxyCODONE-acetaminophen (ROXICET) 5-325 MG tablet Take 1 tablet by mouth every 6 (six) hours as needed for severe pain.   . sitaGLIPtin-metformin (JANUMET) 50-1000 MG tablet Take by mouth. 07/03/2016: Received from: [facility]: Take 1 tablet by mouth 2 (two) times daily with meals.   No facility-administered encounter medications on file as of 12/17/2017.     Surgical History: History reviewed. No pertinent surgical history.  Medical History: Past Medical History:  Diagnosis Date  . Diabetes mellitus without complication ([place])   . Hyperlipidemia   . Hypertension     Family History: Family History  Problem Relation Age of Onset  . Diabetes Mother   . Varicose Veins Mother     Social History: Social History   Socioeconomic  History  . Marital status: Married    Spouse name: Not on file  . Number of children: Not on file  . Years of education: Not on file  . Highest education level: Not on file  Occupational History  . Not on file  Social Needs  . Financial resource strain: Not on file  . Food insecurity:    Worry: Not on file    Inability: Not on file  . Transportation  needs:    Medical: Not on file    Non-medical: Not on file  Tobacco Use  . Smoking status: Never Smoker  . Smokeless tobacco: Never Used  Substance and Sexual Activity  . Alcohol use: Never    Frequency: Never  . Drug use: Never  . Sexual activity: Not on file  Lifestyle  . Physical activity:    Days per week: Not on file    Minutes per session: Not on file  . Stress: Not on file  Relationships  . Social connections:    Talks on phone: Not on file    Gets together: Not on file    Attends religious service: Not on file    Active member of club or organization: Not on file    Attends meetings of clubs or organizations: Not on file    Relationship status: Not on file  . Intimate partner violence:    Fear of current or ex partner: Not on file    Emotionally abused: Not on file    Physically abused: Not on file    Forced sexual activity: Not on file  Other Topics Concern  . Not on file  Social History Narrative  . Not on file    Vital Signs: Blood pressure 130/80, pulse 90, resp. rate 16, height 5' 11.5" (1.816 m), weight 275 lb (124.7 kg), SpO2 95 %.  Examination: General Appearance: The patient is well-developed, well-nourished, and in no distress. Skin: Gross inspection of skin unremarkable. Head: normocephalic, no gross deformities. Eyes: no gross deformities noted. ENT: ears appear grossly normal no exudates. Neck: Supple. No thyromegaly. No LAD. Respiratory: few rhonchi noted. Cardiovascular: Normal S1 and S2 without murmur or rub. Extremities: No cyanosis. pulses are equal. Neurologic: Alert and oriented. No involuntary movements.  LABS: Recent Results (from the past 2160 hour(s))  POCT HgB A1C     Status: None   Collection Time: 11/24/17  2:21 PM  Result Value Ref Range   Hemoglobin A1C 6.5   Comprehensive metabolic panel     Status: Abnormal   Collection Time: 11/25/17 10:47 AM  Result Value Ref Range   Glucose 131 (H) 65 - 99 mg/dL   BUN 15 8 - 27  mg/dL   Creatinine, Ser 1.06 0.76 - 1.27 mg/dL   GFR calc non Af Amer 69 >59 mL/min/1.73   GFR calc Af Amer 80 >59 mL/min/1.73   BUN/Creatinine Ratio 14 10 - 24   Sodium 139 134 - 144 mmol/L   Potassium 4.0 3.5 - 5.2 mmol/L   Chloride 101 96 - 106 mmol/L   CO2 22 20 - 29 mmol/L   Calcium 9.4 8.6 - 10.2 mg/dL   Total Protein 7.6 6.0 - 8.5 g/dL   Albumin 4.3 3.5 - 4.8 g/dL   Globulin, Total 3.3 1.5 - 4.5 g/dL   Albumin/Globulin Ratio 1.3 1.2 - 2.2   Bilirubin Total 0.9 0.0 - 1.2 mg/dL   Alkaline Phosphatase 95 39 - 117 IU/L   AST 15 0 - 40 IU/L  ALT 17 0 - 44 IU/L  CBC     Status: Abnormal   Collection Time: 11/25/17 10:47 AM  Result Value Ref Range   WBC 6.9 3.4 - 10.8 x10E3/uL   RBC 4.71 4.14 - 5.80 x10E6/uL   Hemoglobin 11.4 (L) 13.0 - 17.7 g/dL   Hematocrit 36.3 (L) 37.5 - 51.0 %   MCV 77 (L) 79 - 97 fL   MCH 24.2 (L) 26.6 - 33.0 pg   MCHC 31.4 (L) 31.5 - 35.7 g/dL   RDW 18.0 (H) 12.3 - 15.4 %   Platelets 328 150 - 379 x10E3/uL  Lipid Panel w/o Chol/HDL Ratio     Status: None   Collection Time: 11/25/17 10:47 AM  Result Value Ref Range   Cholesterol, Total 115 100 - 199 mg/dL   Triglycerides 41 0 - 149 mg/dL   HDL 45 >39 mg/dL   VLDL Cholesterol Cal 8 5 - 40 mg/dL   LDL Calculated 62 0 - 99 mg/dL  T4, free     Status: None   Collection Time: 11/25/17 10:47 AM  Result Value Ref Range   Free T4 1.31 0.82 - 1.77 ng/dL  TSH     Status: None   Collection Time: 11/25/17 10:47 AM  Result Value Ref Range   TSH 3.310 0.450 - 4.500 uIU/mL  PSA     Status: None   Collection Time: 11/25/17 10:47 AM  Result Value Ref Range   Prostate Specific Ag, Serum 0.9 0.0 - 4.0 ng/mL    Comment: Roche ECLIA methodology. According to the American Urological Association, Serum PSA should decrease and remain at undetectable levels after radical prostatectomy. The AUA defines biochemical recurrence as an initial PSA value 0.2 ng/mL or greater followed by a subsequent confirmatory PSA  value 0.2 ng/mL or greater. Values obtained with different assay methods or kits cannot be used interchangeably. Results cannot be interpreted as absolute evidence of the presence or absence of malignant disease.   Brain natriuretic peptide     Status: Abnormal   Collection Time: 11/25/17 10:47 AM  Result Value Ref Range   BNP 156.5 (H) 0.0 - 100.0 pg/mL  Microalbumin, urine     Status: None   Collection Time: 11/25/17 10:47 AM  Result Value Ref Range   Microalbumin, Urine 83.1 Not Estab. ug/mL  Lipase, blood     Status: None   Collection Time: 12/09/17 10:28 AM  Result Value Ref Range   Lipase 32 11 - 51 U/L    Comment: Performed at [facility], [address] [place], [place] [address]  Comprehensive metabolic panel     Status: Abnormal   Collection Time: 12/09/17 10:28 AM  Result Value Ref Range   Sodium 135 135 - 145 mmol/L   Potassium 3.7 3.5 - 5.1 mmol/L   Chloride 103 101 - 111 mmol/L   CO2 25 22 - 32 mmol/L   Glucose, Bld 178 (H) 65 - 99 mg/dL   BUN 19 6 - 20 mg/dL   Creatinine, Ser 0.92 0.61 - 1.24 mg/dL   Calcium 8.8 (L) 8.9 - 10.3 mg/dL   Total Protein 7.6 6.5 - 8.1 g/dL   Albumin 3.8 3.5 - 5.0 g/dL   AST 22 15 - 41 U/L   ALT 24 17 - 63 U/L   Alkaline Phosphatase 78 38 - 126 U/L   Total Bilirubin 1.1 0.3 - 1.2 mg/dL   GFR calc non Af Amer >60 >60 mL/min   GFR calc Af Amer >  60 >60 mL/min    Comment: (NOTE) The eGFR has been calculated using the CKD EPI equation. This calculation has not been validated in all clinical situations. eGFR's persistently <60 mL/min signify possible Chronic Kidney Disease.    Anion gap 7 5 - 15    Comment: Performed at [facility], [address] [place], [place] [address]  CBC     Status: Abnormal   Collection Time: 12/09/17 10:28 AM  Result Value Ref Range   WBC 6.3 3.8 - 10.6 K/uL   RBC 4.64 4.40 - 5.90 MIL/uL   Hemoglobin 11.7 (L) 13.0 - 18.0 g/dL   HCT 36.1 (L) 40.0 - 52.0 %   MCV 77.8 (L) 80.0 -  100.0 fL   MCH 25.1 (L) 26.0 - 34.0 pg   MCHC 32.3 32.0 - 36.0 g/dL   RDW 19.7 (H) 11.5 - 14.5 %   Platelets 299 150 - 440 K/uL    Comment: Performed at [facility], [address] [place], [place] [address]  Troponin I     Status: None   Collection Time: 12/09/17 10:28 AM  Result Value Ref Range   Troponin I <0.03 <0.03 ng/mL    Comment: Performed at [facility], [address], [place], [place] [address]    Radiology: Dg Chest 2 View  Result Date: 12/09/2017 CLINICAL DATA:  72 y/o  M; 2 weeks of abdominal pain and nausea. EXAM: CHEST - 2 VIEW COMPARISON:  08/30/2014 chest radiograph FINDINGS: Stable normal cardiac silhouette given projection and technique. Small left pleural effusion and associated basilar opacity. Pulmonary vascular congestion. Bones are unremarkable. IMPRESSION: Small left pleural effusion and left basilar opacity which may represent associated atelectasis or pneumonia. Pulmonary vascular congestion. Electronically Signed   By: Kristine Garbe M.D.   On: 12/09/2017 15:33    No results found.  Dg Chest 2 View  Result Date: 12/09/2017 CLINICAL DATA:  72 y/o  M; 2 weeks of abdominal pain and nausea. EXAM: CHEST - 2 VIEW COMPARISON:  08/30/2014 chest radiograph FINDINGS: Stable normal cardiac silhouette given projection and technique. Small left pleural effusion and associated basilar opacity. Pulmonary vascular congestion. Bones are unremarkable. IMPRESSION: Small left pleural effusion and left basilar opacity which may represent associated atelectasis or pneumonia. Pulmonary vascular congestion. Electronically Signed   By: Kristine Garbe M.D.   On: 12/09/2017 15:33   Dg Duanne Limerick W/small Bowel  Result Date: 12/01/2017 CLINICAL DATA:  72 year old male with dysphagia.  Initial encounter. EXAM: UPPER GI SERIES WITH SMALL BOWEL FOLLOW-THROUGH FLUOROSCOPY TIME:  Fluoroscopy Time:  3 minutes and 12 seconds. Radiation Exposure Index: 433.3 mGy.  TECHNIQUE: Combined double contrast and single contrast upper GI series using effervescent crystals, thick barium, and thin barium. Subsequently, serial images of the small bowel were obtained including spot views of the terminal ileum. COMPARISON:  05/06/2017 upper GI series. FINDINGS: Moderate stool on scout view. Normal primary esophageal stripping wave without esophageal obstructing lesion. Very small sliding-type hiatal hernia. Mild gastroesophageal reflux into the distal esophagus with change of patient position. No esophageal mucosa abnormality noted. Mild secretions within the stomach without ulceration or mass identified. No duodenal bulb ulcer. Barium traveled throughout the small bowel entering the colon 2 hours and 30 minutes after beginning exam. No esophageal obstructing or constricting lesion. No focal ulceration or abnormal dilation. Spot views the terminal ileum within normal limits. IMPRESSION: Very small sliding-type hiatal hernia. Mild gastroesophageal reflux occurred with change of patient position. No esophageal mucosa abnormality noted.  Mild retained secretions within the stomach without ulceration or mass identified. It is possible gastric secretions are related to underlying mild gastritis. Slow small bowel transit time.  Small bowel otherwise unremarkable. Electronically Signed   By: Genia Del M.D.   On: 12/01/2017 12:21      Assessment and Plan: Patient Active Problem List   Diagnosis Date Noted  . Diabetes mellitus with gastroparesis ([place]) 12/08/2017  . Gastroesophageal reflux disease without esophagitis 11/25/2017  . Uncontrolled type 2 diabetes mellitus with hyperglycemia ([place]) 11/24/2017  . Obstructive sleep apnea 11/24/2017  . Shortness of breath 11/24/2017  . Hiatal hernia with GERD 11/24/2017  . Lymphedema 07/03/2016  . Swelling of limb 07/03/2016  . Chronic venous insufficiency 07/03/2016  . Type 2 diabetes mellitus treated with insulin ([place]) 07/03/2016  .  Essential hypertension 07/03/2016  . Hyperlipidemia 07/03/2016    1. Lobar Pneumonia my concern is if there is no resolution of symptoms will need further evaluation to assess the pneumonia and also the fluid that was seen aroudn the lung. He states that he took doxy for the acute infection .  His energy is not back to his usual baseline 2. Pleural Effusion will get a CT of the chest to further assess if there is any evidence of empyema.  If this is the case he would need a thoracentesis done for further assessment 3. OSA on CPAP using the CPAP as ordered will be continued. He states that he does not feel very rested at all unfortunately. Patient states that he wants to see about reassessing the sleep also. 4. Shortness of Breath not getting better will also get PFT. He staets that he quit smoking 30 years ago  General Counseling: I have discussed the findings of the evaluation and examination with Elberta Fortis.  I have also discussed any further diagnostic evaluation thatmay be needed or ordered today. Marquelle verbalizes understanding of the findings of todays visit. We also reviewed his medications today and discussed drug interactions and side effects including but not limited excessive drowsiness and altered mental states. We also discussed that there is always a risk not just to him but also people around him. he has been encouraged to call the office with any questions or concerns that should arise related to todays visit.    Time spent: 72mn  I have personally obtained a history, examined the patient, evaluated laboratory and imaging results, formulated the assessment and plan and placed orders.    SAllyne Gee MD FApex [facility] CenterPulmonary and Critical Care Sleep medicine

## 2017-12-18 ENCOUNTER — Other Ambulatory Visit: Payer: Self-pay

## 2017-12-19 ENCOUNTER — Other Ambulatory Visit: Payer: Self-pay

## 2017-12-19 ENCOUNTER — Encounter: Payer: Self-pay | Admitting: Emergency Medicine

## 2017-12-19 ENCOUNTER — Emergency Department: Payer: Medicare Other

## 2017-12-19 ENCOUNTER — Emergency Department
Admission: EM | Admit: 2017-12-19 | Discharge: 2017-12-19 | Disposition: A | Discharge status: Home / Self Care | Payer: Medicare Other | Attending: Emergency Medicine | Admitting: Emergency Medicine

## 2017-12-19 DIAGNOSIS — J90 Pleural effusion, not elsewhere classified: Secondary | ICD-10-CM | POA: Diagnosis not present

## 2017-12-19 DIAGNOSIS — Z7984 Long term (current) use of oral hypoglycemic drugs: Secondary | ICD-10-CM | POA: Diagnosis not present

## 2017-12-19 DIAGNOSIS — R0789 Other chest pain: Secondary | ICD-10-CM | POA: Diagnosis not present

## 2017-12-19 DIAGNOSIS — R079 Chest pain, unspecified: Secondary | ICD-10-CM | POA: Insufficient documentation

## 2017-12-19 DIAGNOSIS — I10 Essential (primary) hypertension: Secondary | ICD-10-CM | POA: Insufficient documentation

## 2017-12-19 DIAGNOSIS — Z7982 Long term (current) use of aspirin: Secondary | ICD-10-CM | POA: Diagnosis not present

## 2017-12-19 DIAGNOSIS — Z79899 Other long term (current) drug therapy: Secondary | ICD-10-CM | POA: Insufficient documentation

## 2017-12-19 DIAGNOSIS — R531 Weakness: Secondary | ICD-10-CM | POA: Diagnosis present

## 2017-12-19 DIAGNOSIS — J181 Lobar pneumonia, unspecified organism: Secondary | ICD-10-CM | POA: Insufficient documentation

## 2017-12-19 DIAGNOSIS — R0602 Shortness of breath: Secondary | ICD-10-CM | POA: Insufficient documentation

## 2017-12-19 DIAGNOSIS — J189 Pneumonia, unspecified organism: Secondary | ICD-10-CM | POA: Diagnosis not present

## 2017-12-19 DIAGNOSIS — R05 Cough: Secondary | ICD-10-CM | POA: Insufficient documentation

## 2017-12-19 DIAGNOSIS — E119 Type 2 diabetes mellitus without complications: Secondary | ICD-10-CM | POA: Diagnosis not present

## 2017-12-19 LAB — CBC
HCT: 36 % — ABNORMAL LOW (ref 40.0–52.0)
Hemoglobin: 12.1 g/dL — ABNORMAL LOW (ref 13.0–18.0)
MCH: 25.9 pg — ABNORMAL LOW (ref 26.0–34.0)
MCHC: 33.5 g/dL (ref 32.0–36.0)
MCV: 77.2 fL — AB (ref 80.0–100.0)
PLATELETS: 298 10*3/uL (ref 150–440)
RBC: 4.66 MIL/uL (ref 4.40–5.90)
RDW: 19.7 % — ABNORMAL HIGH (ref 11.5–14.5)
WBC: 6.8 10*3/uL (ref 3.8–10.6)

## 2017-12-19 LAB — URINALYSIS, COMPLETE (UACMP) WITH MICROSCOPIC
BACTERIA UA: NONE SEEN
BILIRUBIN URINE: NEGATIVE
GLUCOSE, UA: NEGATIVE mg/dL
HGB URINE DIPSTICK: NEGATIVE
Ketones, ur: NEGATIVE mg/dL
LEUKOCYTES UA: NEGATIVE
NITRITE: NEGATIVE
PH: 6 (ref 5.0–8.0)
Protein, ur: NEGATIVE mg/dL
Specific Gravity, Urine: 1.004 — ABNORMAL LOW (ref 1.005–1.030)
Squamous Epithelial / LPF: NONE SEEN (ref 0–5)

## 2017-12-19 LAB — BASIC METABOLIC PANEL
Anion gap: 13 (ref 5–15)
BUN: 15 mg/dL (ref 6–20)
CHLORIDE: 103 mmol/L (ref 101–111)
CO2: 22 mmol/L (ref 22–32)
CREATININE: 0.83 mg/dL (ref 0.61–1.24)
Calcium: 9.1 mg/dL (ref 8.9–10.3)
Glucose, Bld: 157 mg/dL — ABNORMAL HIGH (ref 65–99)
POTASSIUM: 3.4 mmol/L — AB (ref 3.5–5.1)
SODIUM: 138 mmol/L (ref 135–145)

## 2017-12-19 LAB — TROPONIN I: Troponin I: <0.03 ng/mL (ref <0.03)

## 2017-12-19 MED ORDER — LEVOFLOXACIN 750 MG PO TABS
750.0000 mg | ORAL_TABLET | Freq: Once | ORAL | Status: AC
  Administered 2017-12-19: 750 mg via ORAL
  Filled 2017-12-19: qty 1

## 2017-12-19 MED ORDER — LEVOFLOXACIN 750 MG PO TABS: 750.0000 mg | ORAL_TABLET | Freq: Every day | ORAL | 0 refills | Status: AC

## 2017-12-19 NOTE — ED Provider Notes (Signed)
[facility] Emergency Department Provider Note  ____________________________________________   First MD Initiated Contact with Patient 12/19/17 1915     (approximate)  I have reviewed the triage vital signs and the nursing notes.   HISTORY  Chief Complaint Weakness and Shortness of Breath   HPI Maurice Sanders is a 72 y.o. male who self presents to the emergency department with roughly 10 days of improving cough and shortness of breath.  He was seen in our emergency department 10 days ago and diagnosed with community-acquired pneumonia and prescribed doxycycline.  He completed the course and feels like his symptoms have largely improved although not resolved.  Today he went to urgent care who performed a chest x-ray and advised him to come to the emergency department for further evaluation.  He has sharp upper chest pain worse with cough improved when not coughing.  Mild shortness of breath.  No fevers or chills.  Her symptoms were gradual onset slowly progressive are now constant although clearly improved from previous.  Past Medical History:  Diagnosis Date  . Diabetes mellitus without complication ([facility])   . Hyperlipidemia   . Hypertension     Patient Active Problem List   Diagnosis Date Noted  . Diabetes mellitus with gastroparesis ([facility]) 12/08/2017  . Gastroesophageal reflux disease without esophagitis 11/25/2017  . Uncontrolled type 2 diabetes mellitus with hyperglycemia ([facility]) 11/24/2017  . Obstructive sleep apnea 11/24/2017  . Shortness of breath 11/24/2017  . Hiatal hernia with GERD 11/24/2017  . Lymphedema 07/03/2016  . Swelling of limb 07/03/2016  . Chronic venous insufficiency 07/03/2016  . Type 2 diabetes mellitus treated with insulin ([facility]) 07/03/2016  . Essential hypertension 07/03/2016  . Hyperlipidemia 07/03/2016    History reviewed. No pertinent surgical history.  Prior to Admission medications   Medication Sig Start Date End Date  Taking? Authorizing Provider  aspirin EC 81 MG tablet Take by mouth.    [provider]  atorvastatin (LIPITOR) 10 MG tablet Take by mouth.    [provider]  clotrimazole-betamethasone (LOTRISONE) cream Apply topically.    [provider]  furosemide (LASIX) 40 MG tablet Take by mouth.    [provider]  glimepiride (AMARYL) 2 MG tablet TK 1 T PO D WITH LARGEST MEAL OF THE DAY FOR DIABETES 04/12/16   [provider]  levofloxacin (LEVAQUIN) 750 MG tablet Take 1 tablet (750 mg total) by mouth daily for 6 days. 12/19/17 12/25/17  Merrily Brittle, MD  lisinopril (PRINIVIL,ZESTRIL) 10 MG tablet TK 1 T PO D FOR BP 04/12/16   [provider]  metoCLOPramide (REGLAN) 10 MG tablet Take 1 tablet (10 mg total) by mouth every 8 (eight) hours as needed for nausea. 12/07/17   Carlean Jews, NP  omeprazole (PRILOSEC) 40 MG capsule Take 40 mg by mouth daily.    [provider]  oxyCODONE-acetaminophen (ROXICET) 5-325 MG tablet Take 1 tablet by mouth every 6 (six) hours as needed for severe pain. 11/25/17   Carlean Jews, NP  sitaGLIPtin-metformin (JANUMET) 50-1000 MG tablet Take by mouth.    [provider]    Allergies Patient has no known allergies.  Family History  Problem Relation Age of Onset  . Diabetes Mother   . Varicose Veins Mother     Social History Social History   Tobacco Use  . Smoking status: Never Smoker  . Smokeless tobacco: Never Used  Substance Use Topics  . Alcohol use: Never    Frequency: Never  .  Drug use: Never    Review of Systems Constitutional: No fever/chills Eyes: No visual changes. ENT: No sore throat. Cardiovascular: Positive for chest pain. Respiratory: Positive for shortness of breath. Gastrointestinal: No abdominal pain.  No nausea, no vomiting.  No diarrhea.  No constipation. Genitourinary: Negative for dysuria. Musculoskeletal: Negative for back pain. Skin: Negative for  rash. Neurological: Negative for headaches, focal weakness or numbness.   ____________________________________________   PHYSICAL EXAM:  VITAL SIGNS: ED Triage Vitals  Enc Vitals Group     BP 12/19/17 1557 135/68     Pulse Rate 12/19/17 1557 (!) 113     Resp 12/19/17 1557 20     Temp 12/19/17 1557 98.1 F (36.7 C)     Temp Source 12/19/17 1557 Oral     SpO2 12/19/17 1557 91 %     Weight 12/19/17 1558 275 lb (124.7 kg)     Height 12/19/17 1558  (1.803 m)     Head Circumference --      Peak Flow --      Pain Score 12/19/17 1558 0     Pain Loc --      Pain Edu? --      Excl. in GC? --     Constitutional: Alert and oriented x4 pleasant cooperative speaks in full clear sentences joking and laughing Eyes: PERRL EOMI. Head: Atraumatic. Nose: No congestion/rhinnorhea. Mouth/Throat: No trismus Neck: No stridor.   Cardiovascular: Tachycardic rate, regular rhythm. Grossly normal heart sounds.  Good peripheral circulation. Respiratory: Very slightly increased respiratory effort although no accessory muscle use.  Crackles at left base Gastrointestinal: Soft nontender Musculoskeletal: Legs are equal in size Neurologic:  Normal speech and language. No gross focal neurologic deficits are appreciated. Skin:  Skin is warm, dry and intact. No rash noted. Psychiatric: Mood and affect are normal. Speech and behavior are normal.    ____________________________________________   DIFFERENTIAL includes but not limited to  Pneumonia, post viral syndrome, pneumothorax, empyema, sepsis ____________________________________________   LABS (all labs ordered are listed, but only abnormal results are displayed)  Labs Reviewed  BASIC METABOLIC PANEL - Abnormal; Notable for the following components:      Result Value   Potassium 3.4 (*)    Glucose, Bld 157 (*)    All other components within normal limits  CBC - Abnormal; Notable for the following components:   Hemoglobin 12.1 (*)     HCT 36.0 (*)    MCV 77.2 (*)    MCH 25.9 (*)    RDW 19.7 (*)    All other components within normal limits  URINALYSIS, COMPLETE (UACMP) WITH MICROSCOPIC - Abnormal; Notable for the following components:   Color, Urine COLORLESS (*)    APPearance CLEAR (*)    Specific Gravity, Urine 1.004 (*)    All other components within normal limits  TROPONIN I    Lab work reviewed by me with slightly decreased hemoglobin although not significant __________________________________________  EKG   ____________________________________________  RADIOLOGY  X-ray reviewed by me consistent with left lower lobe pneumonia ____________________________________________   PROCEDURES  Procedure(s) performed: no  Procedures  Critical Care performed: no  Observation: no ____________________________________________   INITIAL IMPRESSION / ASSESSMENT AND PLAN / ED COURSE  Pertinent labs & imaging results that were available during my care of the patient were reviewed by me and considered in my medical decision making (see chart for details).  The patient arrives well-appearing although somewhat short of breath.  His oxygen saturation is in the  mid 90s on room air.  I appreciate that his chest x-ray shows persistent infiltrate and it is possible that he has persistent disease despite doxycycline treatment.  Doxycycline is not 100% for community-acquired pneumonia so I think is reasonable to give him another course of antibiotics this time Levaquin.  First dose given here and the patient was observed for over an hour with no deterioration in his symptoms.  He is able to ambulate without difficulty.  At this point I will discharge him with primary care follow-up.  He verbalizes understanding and agreement with the plan.  Strict return precautions have been given.      ____________________________________________   FINAL CLINICAL IMPRESSION(S) / ED DIAGNOSES  Final diagnoses:  Community acquired  pneumonia of left lower lobe of lung (HCC)      NEW MEDICATIONS STARTED DURING THIS VISIT:  New Prescriptions   LEVOFLOXACIN (LEVAQUIN) 750 MG TABLET    Take 1 tablet (750 mg total) by mouth daily for 6 days.     Note:  This document was prepared using Dragon voice recognition software and may include unintentional dictation errors.     Merrily Brittle, MD 12/19/17 2059

## 2017-12-19 NOTE — Discharge Instructions (Signed)
It was a pleasure to take care of you today, and thank you for coming to our emergency department.  If you have any questions or concerns before leaving please ask the nurse to grab me and I'm more than happy to go through your aftercare instructions again.  If you were prescribed any opioid pain medication today such as Norco, Vicodin, Percocet, morphine, hydrocodone, or oxycodone please make sure you do not drive when you are taking this medication as it can alter your ability to drive safely.  If you have any concerns once you are home that you are not improving or are in fact getting worse before you can make it to your follow-up appointment, please do not hesitate to call 911 and come back for further evaluation.  Merrily Brittle, MD  Results for orders placed or performed during the hospital encounter of 12/19/17  Basic metabolic panel  Result Value Ref Range   Sodium 138 135 - 145 mmol/L   Potassium 3.4 (L) 3.5 - 5.1 mmol/L   Chloride 103 101 - 111 mmol/L   CO2 22 22 - 32 mmol/L   Glucose, Bld 157 (H) 65 - 99 mg/dL   BUN 15 6 - 20 mg/dL   Creatinine, Ser 1.61 0.61 - 1.24 mg/dL   Calcium 9.1 8.9 - 09.6 mg/dL   GFR calc non Af Amer >60 >60 mL/min   GFR calc Af Amer >60 >60 mL/min   Anion gap 13 5 - 15  CBC  Result Value Ref Range   WBC 6.8 3.8 - 10.6 K/uL   RBC 4.66 4.40 - 5.90 MIL/uL   Hemoglobin 12.1 (L) 13.0 - 18.0 g/dL   HCT 04.5 (L) 40.9 - 81.1 %   MCV 77.2 (L) 80.0 - 100.0 fL   MCH 25.9 (L) 26.0 - 34.0 pg   MCHC 33.5 32.0 - 36.0 g/dL   RDW 91.4 (H) 78.2 - 95.6 %   Platelets 298 150 - 440 K/uL  Troponin I  Result Value Ref Range   Troponin I <0.03 <0.03 ng/mL  Urinalysis, Complete w Microscopic  Result Value Ref Range   Color, Urine COLORLESS (A) YELLOW   APPearance CLEAR (A) CLEAR   Specific Gravity, Urine 1.004 (L) 1.005 - 1.030   pH 6.0 5.0 - 8.0   Glucose, UA NEGATIVE NEGATIVE mg/dL   Hgb urine dipstick NEGATIVE NEGATIVE   Bilirubin Urine NEGATIVE NEGATIVE   Ketones, ur NEGATIVE NEGATIVE mg/dL   Protein, ur NEGATIVE NEGATIVE mg/dL   Nitrite NEGATIVE NEGATIVE   Leukocytes, UA NEGATIVE NEGATIVE   RBC / HPF 0-5 0 - 5 RBC/hpf   WBC, UA 0-5 0 - 5 WBC/hpf   Bacteria, UA NONE SEEN NONE SEEN   Squamous Epithelial / LPF NONE SEEN 0 - 5   Dg Chest 2 View  Result Date: 12/19/2017 CLINICAL DATA:  Shortness of breath, cough EXAM: CHEST - 2 VIEW COMPARISON:  12/09/2017 FINDINGS: Small left pleural effusion. Left lower lobe atelectasis or infiltrate. Diffuse interstitial prominence throughout the lungs, mild vascular congestion. Heart is normal size. IMPRESSION: Continued small left effusion with left lower lobe atelectasis or pneumonia. Vascular congestion and interstitial prominence throughout the lungs, similar to prior study. Electronically Signed   By: Charlett Nose M.D.   On: 12/19/2017 16:33   Dg Chest 2 View  Result Date: 12/09/2017 CLINICAL DATA:  72 y/o  M; 2 weeks of abdominal pain and nausea. EXAM: CHEST - 2 VIEW COMPARISON:  08/30/2014 chest radiograph FINDINGS: Stable normal cardiac  silhouette given projection and technique. Small left pleural effusion and associated basilar opacity. Pulmonary vascular congestion. Bones are unremarkable. IMPRESSION: Small left pleural effusion and left basilar opacity which may represent associated atelectasis or pneumonia. Pulmonary vascular congestion. Electronically Signed   By: Mitzi Hansen M.D.   On: 12/09/2017 15:33   Dg Kayleen Memos W/small Bowel  Result Date: 12/01/2017 CLINICAL DATA:  72 year old male with dysphagia.  Initial encounter. EXAM: UPPER GI SERIES WITH SMALL BOWEL FOLLOW-THROUGH FLUOROSCOPY TIME:  Fluoroscopy Time:  3 minutes and 12 seconds. Radiation Exposure Index: 433.3 mGy. TECHNIQUE: Combined double contrast and single contrast upper GI series using effervescent crystals, thick barium, and thin barium. Subsequently, serial images of the small bowel were obtained including spot views of the  terminal ileum. COMPARISON:  05/06/2017 upper GI series. FINDINGS: Moderate stool on scout view. Normal primary esophageal stripping wave without esophageal obstructing lesion. Very small sliding-type hiatal hernia. Mild gastroesophageal reflux into the distal esophagus with change of patient position. No esophageal mucosa abnormality noted. Mild secretions within the stomach without ulceration or mass identified. No duodenal bulb ulcer. Barium traveled throughout the small bowel entering the colon 2 hours and 30 minutes after beginning exam. No esophageal obstructing or constricting lesion. No focal ulceration or abnormal dilation. Spot views the terminal ileum within normal limits. IMPRESSION: Very small sliding-type hiatal hernia. Mild gastroesophageal reflux occurred with change of patient position. No esophageal mucosa abnormality noted. Mild retained secretions within the stomach without ulceration or mass identified. It is possible gastric secretions are related to underlying mild gastritis. Slow small bowel transit time.  Small bowel otherwise unremarkable. Electronically Signed   By: Lacy Duverney M.D.   On: 12/01/2017 12:21

## 2017-12-19 NOTE — ED Triage Notes (Signed)
Here for [facility] and generalized weakness.  Treated for PNA 12/09/17.  Reports does not feel any better.  Unlabored in triage without sx increased WOB.  Denies any pain.  No fevers.

## 2017-12-23 ENCOUNTER — Ambulatory Visit
Admission: RE | Admit: 2017-12-23 | Discharge: 2017-12-23 | Disposition: A | Discharge status: Home / Self Care | Payer: Medicare Other | Source: Ambulatory Visit | Attending: Internal Medicine | Admitting: Internal Medicine

## 2017-12-23 DIAGNOSIS — J181 Lobar pneumonia, unspecified organism: Secondary | ICD-10-CM | POA: Diagnosis not present

## 2017-12-23 DIAGNOSIS — J90 Pleural effusion, not elsewhere classified: Secondary | ICD-10-CM | POA: Insufficient documentation

## 2017-12-23 DIAGNOSIS — R590 Localized enlarged lymph nodes: Secondary | ICD-10-CM | POA: Diagnosis not present

## 2017-12-23 DIAGNOSIS — I2510 Atherosclerotic heart disease of native coronary artery without angina pectoris: Secondary | ICD-10-CM | POA: Insufficient documentation

## 2017-12-23 DIAGNOSIS — J9811 Atelectasis: Secondary | ICD-10-CM | POA: Insufficient documentation

## 2017-12-23 DIAGNOSIS — R918 Other nonspecific abnormal finding of lung field: Secondary | ICD-10-CM | POA: Diagnosis not present

## 2017-12-23 DIAGNOSIS — J189 Pneumonia, unspecified organism: Secondary | ICD-10-CM | POA: Diagnosis not present

## 2017-12-23 DIAGNOSIS — I700 Atherosclerosis of aorta: Secondary | ICD-10-CM | POA: Insufficient documentation

## 2017-12-23 MED ORDER — IOPAMIDOL (ISOVUE-370) INJECTION 76%
75.0000 mL | Freq: Once | INTRAVENOUS | Status: AC | PRN
  Administered 2017-12-23: 75 mL via INTRAVENOUS

## 2017-12-30 DIAGNOSIS — G4733 Obstructive sleep apnea (adult) (pediatric): Secondary | ICD-10-CM | POA: Diagnosis not present

## 2018-01-04 ENCOUNTER — Encounter: Payer: Self-pay | Admitting: Internal Medicine

## 2018-01-04 ENCOUNTER — Other Ambulatory Visit: Payer: Self-pay

## 2018-01-04 ENCOUNTER — Ambulatory Visit (INDEPENDENT_AMBULATORY_CARE_PROVIDER_SITE_OTHER): Payer: Medicare Other | Admitting: Internal Medicine

## 2018-01-04 VITALS — BP 160/92 | HR 113 | Resp 18 | Ht 71.5 in | Wt 271.0 lb

## 2018-01-04 DIAGNOSIS — R0602 Shortness of breath: Secondary | ICD-10-CM

## 2018-01-04 DIAGNOSIS — G4733 Obstructive sleep apnea (adult) (pediatric): Secondary | ICD-10-CM | POA: Diagnosis not present

## 2018-01-04 DIAGNOSIS — K219 Gastro-esophageal reflux disease without esophagitis: Secondary | ICD-10-CM

## 2018-01-04 DIAGNOSIS — Z9989 Dependence on other enabling machines and devices: Secondary | ICD-10-CM

## 2018-01-04 DIAGNOSIS — R591 Generalized enlarged lymph nodes: Secondary | ICD-10-CM

## 2018-01-04 MED ORDER — GLIMEPIRIDE 2 MG PO TABS: ORAL_TABLET | ORAL | 1 refills | Status: AC

## 2018-01-04 MED ORDER — ATORVASTATIN CALCIUM 10 MG PO TABS: ORAL_TABLET | ORAL | 1 refills | Status: AC

## 2018-01-04 MED ORDER — SITAGLIPTIN PHOS-METFORMIN HCL 50-1000 MG PO TABS: ORAL_TABLET | ORAL | 1 refills | Status: AC

## 2018-01-04 MED ORDER — LISINOPRIL 10 MG PO TABS: ORAL_TABLET | ORAL | 1 refills | Status: AC

## 2018-01-04 NOTE — Patient Instructions (Signed)
Lymphadenopathy Lymphadenopathy refers to swollen or enlarged lymph glands, also called lymph nodes. Lymph glands are part of your body's defense (immune) system, which protects the body from infections, germs, and diseases. Lymph glands are found in many locations in your body, including the neck, underarm, and groin. Many things can cause lymph glands to become enlarged. When your immune system responds to germs, such as viruses or bacteria, infection-fighting cells and fluid build up. This causes the glands to grow in size. Usually, this is not something to worry about. The swelling and any soreness often go away without treatment. However, swollen lymph glands can also be caused by a number of diseases. Your health care provider may do various tests to help determine the cause. If the cause of your swollen lymph glands cannot be found, it is important to monitor your condition to make sure the swelling goes away. Follow these instructions at home: Watch your condition for any changes. The following actions may help to lessen any discomfort you are feeling:  Get plenty of rest.  Take medicines only as directed by your health care provider. Your health care provider may recommend over-the-counter medicines for pain.  Apply moist heat compresses to the site of swollen lymph nodes as directed by your health care provider. This can help reduce any pain.  Check your lymph nodes daily for any changes.  Keep all follow-up visits as directed by your health care provider. This is important.  Contact a health care provider if:  Your lymph nodes are still swollen after 2 weeks.  Your swelling increases or spreads to other areas.  Your lymph nodes are hard, seem fixed to the skin, or are growing rapidly.  Your skin over the lymph nodes is red and inflamed.  You have a fever.  You have chills.  You have fatigue.  You develop a sore throat.  You have abdominal pain.  You have weight  loss.  You have night sweats. Get help right away if:  You notice fluid leaking from the area of the enlarged lymph node.  You have severe pain in any area of your body.  You have chest pain.  You have shortness of breath. This information is not intended to replace advice given to you by your health care provider. Make sure you discuss any questions you have with your health care provider. Document Released: 04/29/2008 Document Revised: 12/27/2015 Document Reviewed: 02/23/2014 [employer] Interactive Patient Education  2018 [employer]  

## 2018-01-04 NOTE — Progress Notes (Signed)
[facility] [place], [place] [address]  Pulmonary Sleep Medicine   Office Visit Note  Patient Name: Maurice Sanders DOB: [birth year] MRN [id number]  Date of Service: 01/04/2018  Complaints/HPI: Patient was asked to come in orally because of abnormal CT scan.  The CT scan shows presence of lymphadenopathy in the neck and mediastinal area.  My concern is obviously that this could be neoplastic process versus inflammatory process.  I discussed the possible findings with the patient today and I fever that this is likely sarcoidosis.  He does have a history of incarceration however he has no HIV risk factors in he states that he has been tested for HIV as well as for TB and has been negative.  In addition he has been having coughing cold symptoms that have been going on longer and so therefore I would be concerned about possibility of lymphoma.  There were also some pulmonary nodules noted on the CT scan.  He denies having any fevers at this time.  Denies having any chest pain.  He has had no syncope.  He denies having any other swellings or lumps that he has noticed.  ROS  General: (-) fever, (-) chills, (-) night sweats, (-) weakness Skin: (-) rashes, (-) itching,. Eyes: (-) visual changes, (-) redness, (-) itching. Nose and Sinuses: (-) nasal stuffiness or itchiness, (-) postnasal drip, (-) nosebleeds, (-) sinus trouble. Mouth and Throat: (-) sore throat, (-) hoarseness. Neck: (-) swollen glands, (-) enlarged thyroid, (-) neck pain. Respiratory: + cough, (-) bloody sputum, + shortness of breath, + wheezing. Cardiovascular: - ankle swelling, (-) chest pain. Lymphatic: (-) lymph node enlargement. Neurologic: (-) numbness, (-) tingling. Psychiatric: (-) anxiety, (-) depression   Current Medication: Outpatient Encounter Medications as of 01/04/2018  Medication Sig Note  . aspirin EC 81 MG tablet Take by mouth. 07/03/2016: Received from: [place]: Take 81 mg by mouth once daily.  Marland Kitchen atorvastatin (LIPITOR) 10 MG tablet Take 1 tab by po at bedtime for cholesterol   . clotrimazole-betamethasone (LOTRISONE) cream Apply topically.   . furosemide (LASIX) 40 MG tablet Take by mouth. 07/03/2016: Received from: [place]: Take 40 mg by mouth once daily.  Marland Kitchen glimepiride (AMARYL) 2 MG tablet TK 1 T PO D WITH LARGEST MEAL OF THE DAY FOR DIABETES   . lisinopril (PRINIVIL,ZESTRIL) 10 MG tablet Take 1 tab po daily   . metoCLOPramide (REGLAN) 10 MG tablet Take 1 tablet (10 mg total) by mouth every 8 (eight) hours as needed for nausea.   Marland Kitchen omeprazole (PRILOSEC) 40 MG capsule Take 40 mg by mouth daily.   Marland Kitchen oxyCODONE-acetaminophen (ROXICET) 5-325 MG tablet Take 1 tablet by mouth every 6 (six) hours as needed for severe pain.   . sitaGLIPtin-metformin (JANUMET) 50-1000 MG tablet Take 1 tab by twice daily    No facility-administered encounter medications on file as of 01/04/2018.     Surgical History: History reviewed. No pertinent surgical history.  Medical History: Past Medical History:  Diagnosis Date  . Diabetes mellitus without complication ([place])   . Hyperlipidemia   . Hypertension     Family History: Family History  Problem Relation Age of Onset  . Diabetes Mother   . Varicose Veins Mother     Social History: Social History   Socioeconomic History  . Marital status: Married    Spouse name: Not on file  . Number of children: Not on file  .  Years of education: Not on file  . Highest education level: Not on file  Occupational History  . Not on file  Social Needs  . Financial resource strain: Not on file  . Food insecurity:    Worry: Not on file    Inability: Not on file  . Transportation needs:    Medical: Not on file    Non-medical: Not on file  Tobacco Use  . Smoking status: Never Smoker  . Smokeless tobacco: Never Used  Substance and Sexual Activity  . Alcohol use: Never     Frequency: Never  . Drug use: Never  . Sexual activity: Not on file  Lifestyle  . Physical activity:    Days per week: Not on file    Minutes per session: Not on file  . Stress: Not on file  Relationships  . Social connections:    Talks on phone: Not on file    Gets together: Not on file    Attends religious service: Not on file    Active member of club or organization: Not on file    Attends meetings of clubs or organizations: Not on file    Relationship status: Not on file  . Intimate partner violence:    Fear of current or ex partner: Not on file    Emotionally abused: Not on file    Physically abused: Not on file    Forced sexual activity: Not on file  Other Topics Concern  . Not on file  Social History Narrative  . Not on file    Vital Signs: Blood pressure (!) 160/92, pulse (!) 113, resp. rate 18, height 5' 11.5" (1.816 m), weight 271 lb (122.9 kg), SpO2 92 %.  Examination: General Appearance: The patient is well-developed, well-nourished, and in no distress. Skin: Gross inspection of skin unremarkable. Head: normocephalic, no gross deformities. Eyes: no gross deformities noted. ENT: ears appear grossly normal no exudates. Neck: Supple. No thyromegaly. No LAD. Respiratory: few rhonchi noted. Cardiovascular: Normal S1 and S2 without murmur or rub. Extremities: No cyanosis. pulses are equal. Neurologic: Alert and oriented. No involuntary movements.  LABS: Recent Results (from the past 2160 hour(s))  POCT HgB A1C     Status: None   Collection Time: 11/24/17  2:21 PM  Result Value Ref Range   Hemoglobin A1C 6.5   Comprehensive metabolic panel     Status: Abnormal   Collection Time: 11/25/17 10:47 AM  Result Value Ref Range   Glucose 131 (H) 65 - 99 mg/dL   BUN 15 8 - 27 mg/dL   Creatinine, Ser 1.06 0.76 - 1.27 mg/dL   GFR calc non Af Amer 69 >59 mL/min/1.73   GFR calc Af Amer 80 >59 mL/min/1.73   BUN/Creatinine Ratio 14 10 - 24   Sodium 139 134 - 144 mmol/L    Potassium 4.0 3.5 - 5.2 mmol/L   Chloride 101 96 - 106 mmol/L   CO2 22 20 - 29 mmol/L   Calcium 9.4 8.6 - 10.2 mg/dL   Total Protein 7.6 6.0 - 8.5 g/dL   Albumin 4.3 3.5 - 4.8 g/dL   Globulin, Total 3.3 1.5 - 4.5 g/dL   Albumin/Globulin Ratio 1.3 1.2 - 2.2   Bilirubin Total 0.9 0.0 - 1.2 mg/dL   Alkaline Phosphatase 95 39 - 117 IU/L   AST 15 0 - 40 IU/L   ALT 17 0 - 44 IU/L  CBC     Status: Abnormal   Collection Time: 11/25/17 10:47 AM  Result Value Ref Range   WBC 6.9 3.4 - 10.8 x10E3/uL   RBC 4.71 4.14 - 5.80 x10E6/uL   Hemoglobin 11.4 (L) 13.0 - 17.7 g/dL   Hematocrit 36.3 (L) 37.5 - 51.0 %   MCV 77 (L) 79 - 97 fL   MCH 24.2 (L) 26.6 - 33.0 pg   MCHC 31.4 (L) 31.5 - 35.7 g/dL   RDW 18.0 (H) 12.3 - 15.4 %   Platelets 328 150 - 379 x10E3/uL  Lipid Panel w/o Chol/HDL Ratio     Status: None   Collection Time: 11/25/17 10:47 AM  Result Value Ref Range   Cholesterol, Total 115 100 - 199 mg/dL   Triglycerides 41 0 - 149 mg/dL   HDL 45 >39 mg/dL   VLDL Cholesterol Cal 8 5 - 40 mg/dL   LDL Calculated 62 0 - 99 mg/dL  T4, free     Status: None   Collection Time: 11/25/17 10:47 AM  Result Value Ref Range   Free T4 1.31 0.82 - 1.77 ng/dL  TSH     Status: None   Collection Time: 11/25/17 10:47 AM  Result Value Ref Range   TSH 3.310 0.450 - 4.500 uIU/mL  PSA     Status: None   Collection Time: 11/25/17 10:47 AM  Result Value Ref Range   Prostate Specific Ag, Serum 0.9 0.0 - 4.0 ng/mL    Comment: Roche ECLIA methodology. According to the American Urological Association, Serum PSA should decrease and remain at undetectable levels after radical prostatectomy. The AUA defines biochemical recurrence as an initial PSA value 0.2 ng/mL or greater followed by a subsequent confirmatory PSA value 0.2 ng/mL or greater. Values obtained with different assay methods or kits cannot be used interchangeably. Results cannot be interpreted as absolute evidence of the presence or absence of  malignant disease.   Brain natriuretic peptide     Status: Abnormal   Collection Time: 11/25/17 10:47 AM  Result Value Ref Range   BNP 156.5 (H) 0.0 - 100.0 pg/mL  Microalbumin, urine     Status: None   Collection Time: 11/25/17 10:47 AM  Result Value Ref Range   Microalbumin, Urine 83.1 Not Estab. ug/mL  Lipase, blood     Status: None   Collection Time: 12/09/17 10:28 AM  Result Value Ref Range   Lipase 32 11 - 51 U/L    Comment: Performed at [facility], [address] [place], [place] [address]  Comprehensive metabolic panel     Status: Abnormal   Collection Time: 12/09/17 10:28 AM  Result Value Ref Range   Sodium 135 135 - 145 mmol/L   Potassium 3.7 3.5 - 5.1 mmol/L   Chloride 103 101 - 111 mmol/L   CO2 25 22 - 32 mmol/L   Glucose, Bld 178 (H) 65 - 99 mg/dL   BUN 19 6 - 20 mg/dL   Creatinine, Ser 0.92 0.61 - 1.24 mg/dL   Calcium 8.8 (L) 8.9 - 10.3 mg/dL   Total Protein 7.6 6.5 - 8.1 g/dL   Albumin 3.8 3.5 - 5.0 g/dL   AST 22 15 - 41 U/L   ALT 24 17 - 63 U/L   Alkaline Phosphatase 78 38 - 126 U/L   Total Bilirubin 1.1 0.3 - 1.2 mg/dL   GFR calc non Af Amer >60 >60 mL/min   GFR calc Af Amer >60 >60 mL/min    Comment: (NOTE) The eGFR has been calculated using the CKD EPI equation. This calculation has not  been validated in all clinical situations. eGFR's persistently <60 mL/min signify possible Chronic Kidney Disease.    Anion gap 7 5 - 15    Comment: Performed at [facility], [address] [place], [place] [address]  CBC     Status: Abnormal   Collection Time: 12/09/17 10:28 AM  Result Value Ref Range   WBC 6.3 3.8 - 10.6 K/uL   RBC 4.64 4.40 - 5.90 MIL/uL   Hemoglobin 11.7 (L) 13.0 - 18.0 g/dL   HCT 36.1 (L) 40.0 - 52.0 %   MCV 77.8 (L) 80.0 - 100.0 fL   MCH 25.1 (L) 26.0 - 34.0 pg   MCHC 32.3 32.0 - 36.0 g/dL   RDW 19.7 (H) 11.5 - 14.5 %   Platelets 299 150 - 440 K/uL    Comment: Performed at [facility], [address] [place], [place] [address]  Troponin I     Status: None   Collection Time: 12/09/17 10:28 AM  Result Value Ref Range   Troponin I <0.03 <0.03 ng/mL    Comment: Performed at [facility], [address] [place], [place] [address]  Basic metabolic panel     Status: Abnormal   Collection Time: 12/19/17  4:02 PM  Result Value Ref Range   Sodium 138 135 - 145 mmol/L   Potassium 3.4 (L) 3.5 - 5.1 mmol/L   Chloride 103 101 - 111 mmol/L   CO2 22 22 - 32 mmol/L   Glucose, Bld 157 (H) 65 - 99 mg/dL   BUN 15 6 - 20 mg/dL   Creatinine, Ser 0.83 0.61 - 1.24 mg/dL   Calcium 9.1 8.9 - 10.3 mg/dL   GFR calc non Af Amer >60 >60 mL/min   GFR calc Af Amer >60 >60 mL/min    Comment: (NOTE) The eGFR has been calculated using the CKD EPI equation. This calculation has not been validated in all clinical situations. eGFR's persistently <60 mL/min signify possible Chronic Kidney Disease.    Anion gap 13 5 - 15    Comment: Performed at [facility], [address] [place], [place] [address]  CBC     Status: Abnormal   Collection Time: 12/19/17  4:02 PM  Result Value Ref Range   WBC 6.8 3.8 - 10.6 K/uL   RBC 4.66 4.40 - 5.90 MIL/uL   Hemoglobin 12.1 (L) 13.0 - 18.0 g/dL   HCT 36.0 (L) 40.0 - 52.0 %   MCV 77.2 (L) 80.0 - 100.0 fL   MCH 25.9 (L) 26.0 - 34.0 pg   MCHC 33.5 32.0 - 36.0 g/dL   RDW 19.7 (H) 11.5 - 14.5 %   Platelets 298 150 - 440 K/uL    Comment: Performed at [facility], [address] [place], [place] [address]  Troponin I     Status: None   Collection Time: 12/19/17  4:02 PM  Result Value Ref Range   Troponin I <0.03 <0.03 ng/mL    Comment: Performed at [facility], [address] [place], [place] [address]  Urinalysis, Complete w Microscopic     Status: Abnormal   Collection Time: 12/19/17  4:02 PM  Result Value Ref Range   Color, Urine COLORLESS (A) YELLOW   APPearance CLEAR (A) CLEAR   Specific Gravity, Urine 1.004 (L) 1.005 - 1.030    pH 6.0 5.0 - 8.0   Glucose, UA NEGATIVE NEGATIVE mg/dL   Hgb urine dipstick NEGATIVE NEGATIVE   Bilirubin Urine NEGATIVE  NEGATIVE   Ketones, ur NEGATIVE NEGATIVE mg/dL   Protein, ur NEGATIVE NEGATIVE mg/dL   Nitrite NEGATIVE NEGATIVE   Leukocytes, UA NEGATIVE NEGATIVE   RBC / HPF 0-5 0 - 5 RBC/hpf   WBC, UA 0-5 0 - 5 WBC/hpf   Bacteria, UA NONE SEEN NONE SEEN   Squamous Epithelial / LPF NONE SEEN 0 - 5    Comment: Performed at [facility], [address] [place], [place] [address]    Radiology: Ct Chest W Contrast  Result Date: 12/23/2017 CLINICAL DATA:  Persistent pneumonia.  Weakness and fatigue. EXAM: CT CHEST WITH CONTRAST TECHNIQUE: Multidetector CT imaging of the chest was performed during intravenous contrast administration. CONTRAST:  76m ISOVUE-370 IOPAMIDOL (ISOVUE-370) INJECTION 76% COMPARISON:  Chest radiograph 12/19/2017. FINDINGS: Cardiovascular: Atherosclerotic calcification of the arterial vasculature, including coronary arteries and aortic valve. Heart is mildly enlarged. No pericardial effusion. Mediastinum/Nodes: Low internal jugular lymph nodes measure up to 10 mm on the left. Numerous enlarged mediastinal lymph nodes measure up to 1.7 cm in the low right paratracheal station. Subcarinal lymph node measures 2.0 cm. Bihilar adenopathy measures up to 1.6 cm on the right. No axillary adenopathy. Esophagus is unremarkable. Lungs/Pleura: Image quality is degraded by respiratory motion. Moderate left pleural effusion with compressive atelectasis in the lingula and left lower lobe. Trace right pleural effusion. Minimal dependent atelectasis in the right lower lobe. Scattered millimetric pulmonary nodules measure 4 mm or less in size. Airway is unremarkable. Upper Abdomen: Visualized portions of the liver, gallbladder and right adrenal gland are unremarkable. Nodular thickening of the left adrenal gland. Visualized portions of the kidneys, spleen, pancreas, stomach and  bowel are grossly unremarkable. No upper abdominal adenopathy. Musculoskeletal: Degenerative changes in the spine. IMPRESSION: 1. Low internal jugular, mediastinal and hilar adenopathy, worrisome for lymphoma or metastatic disease. 2. Bilateral pleural effusions, left greater than right. Associated compressive atelectasis in the lingula and left lower lobe. 3. Millimetric pulmonary nodules. Continued attention on follow-up exams is warranted. 4. Aortic atherosclerosis (ICD10-170.0). Coronary artery calcification. Electronically Signed   By: MLorin PicketM.D.   On: 12/23/2017 15:17    No results found.  Dg Chest 2 View  Result Date: 12/19/2017 CLINICAL DATA:  Shortness of breath, cough EXAM: CHEST - 2 VIEW COMPARISON:  12/09/2017 FINDINGS: Small left pleural effusion. Left lower lobe atelectasis or infiltrate. Diffuse interstitial prominence throughout the lungs, mild vascular congestion. Heart is normal size. IMPRESSION: Continued small left effusion with left lower lobe atelectasis or pneumonia. Vascular congestion and interstitial prominence throughout the lungs, similar to prior study. Electronically Signed   By: KRolm BaptiseM.D.   On: 12/19/2017 16:33   Dg Chest 2 View  Result Date: 12/09/2017 CLINICAL DATA:  72y/o  M; 2 weeks of abdominal pain and nausea. EXAM: CHEST - 2 VIEW COMPARISON:  08/30/2014 chest radiograph FINDINGS: Stable normal cardiac silhouette given projection and technique. Small left pleural effusion and associated basilar opacity. Pulmonary vascular congestion. Bones are unremarkable. IMPRESSION: Small left pleural effusion and left basilar opacity which may represent associated atelectasis or pneumonia. Pulmonary vascular congestion. Electronically Signed   By: LKristine GarbeM.D.   On: 12/09/2017 15:33   Ct Chest W Contrast  Result Date: 12/23/2017 CLINICAL DATA:  Persistent pneumonia.  Weakness and fatigue. EXAM: CT CHEST WITH CONTRAST TECHNIQUE: Multidetector  CT imaging of the chest was performed during intravenous contrast administration. CONTRAST:  749mISOVUE-370 IOPAMIDOL (ISOVUE-370) INJECTION 76% COMPARISON:  Chest radiograph 12/19/2017. FINDINGS: Cardiovascular: Atherosclerotic calcification  of the arterial vasculature, including coronary arteries and aortic valve. Heart is mildly enlarged. No pericardial effusion. Mediastinum/Nodes: Low internal jugular lymph nodes measure up to 10 mm on the left. Numerous enlarged mediastinal lymph nodes measure up to 1.7 cm in the low right paratracheal station. Subcarinal lymph node measures 2.0 cm. Bihilar adenopathy measures up to 1.6 cm on the right. No axillary adenopathy. Esophagus is unremarkable. Lungs/Pleura: Image quality is degraded by respiratory motion. Moderate left pleural effusion with compressive atelectasis in the lingula and left lower lobe. Trace right pleural effusion. Minimal dependent atelectasis in the right lower lobe. Scattered millimetric pulmonary nodules measure 4 mm or less in size. Airway is unremarkable. Upper Abdomen: Visualized portions of the liver, gallbladder and right adrenal gland are unremarkable. Nodular thickening of the left adrenal gland. Visualized portions of the kidneys, spleen, pancreas, stomach and bowel are grossly unremarkable. No upper abdominal adenopathy. Musculoskeletal: Degenerative changes in the spine. IMPRESSION: 1. Low internal jugular, mediastinal and hilar adenopathy, worrisome for lymphoma or metastatic disease. 2. Bilateral pleural effusions, left greater than right. Associated compressive atelectasis in the lingula and left lower lobe. 3. Millimetric pulmonary nodules. Continued attention on follow-up exams is warranted. 4. Aortic atherosclerosis (ICD10-170.0). Coronary artery calcification. Electronically Signed   By: Lorin Picket M.D.   On: 12/23/2017 15:17      Assessment and Plan: Patient Active Problem List   Diagnosis Date Noted  . 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 Gastroesophageal reflux disease without esophagitis 11/25/2017  . Uncontrolled type 2 diabetes mellitus with hyperglycemia ([place]) 11/24/2017  . Obstructive sleep apnea 11/24/2017  . Shortness of breath 11/24/2017  . Hiatal hernia with GERD 11/24/2017  . Lymphedema 07/03/2016  . Swelling of limb 07/03/2016  . Chronic venous insufficiency 07/03/2016  . Type 2 diabetes mellitus treated with insulin ([place]) 07/03/2016  . Essential hypertension 07/03/2016  . Hyperlipidemia 07/03/2016    1. Lymphadenopathy  Concerning for possibility of neoplastic versus inflammatory process.  I a.m. Going to refer him to see surgical Oncology for possible lymph node biopsy versus bronchoscopy verses mediastinal lymph node biopsy.  I will not be able to do the bronchoscopy as I myself for be on medical be so therefore I will refer to surgery and then if he does need a bronchoscopy he can be referred to Dr. Mortimer Fries for an EBUS for sampling of mediastinal lymph nodes if need necessary.  I think however that since he does have some paratracheal air neck lymphadenopathy may be easier to sample those for diagnosis. 2. SOB  He states his shortness of breath is at baseline right now doing fairly well.  Continue with present management 3. GERD stable no exacerbation noted at this time. 4. OSA  Patient has been using his CPAP device and has been gaining benefit from it has been very effective he states  It does not seem to bother Deneise Lever is at no other issues.  General Counseling: I have discussed the findings of the evaluation and examination with Elberta Fortis.  I have also discussed any further diagnostic evaluation thatmay be needed or ordered today. Visente verbalizes understanding of the findings of todays visit. We also reviewed his medications today and discussed drug interactions and side effects including but not limited excessive drowsiness and altered mental states. We also discussed  that there is always a risk not just to him but also people around him. he has been encouraged to call the office with any questions or concerns that should  arise related to todays visit.    Time spent: 26mn  I have personally obtained a history, examined the patient, evaluated laboratory and imaging results, formulated the assessment and plan and placed orders.    SAllyne Gee MD FHazard Arh Regional Medical CenterPulmonary and Critical Care Sleep medicine

## 2018-01-07 ENCOUNTER — Ambulatory Visit (INDEPENDENT_AMBULATORY_CARE_PROVIDER_SITE_OTHER): Payer: Medicare Other | Admitting: Cardiothoracic Surgery

## 2018-01-07 ENCOUNTER — Encounter: Payer: Self-pay | Admitting: Cardiothoracic Surgery

## 2018-01-07 VITALS — BP 145/84 | HR 102 | Temp 97.6°F | Resp 20 | Ht 71.5 in | Wt 266.8 lb

## 2018-01-07 DIAGNOSIS — R590 Localized enlarged lymph nodes: Secondary | ICD-10-CM

## 2018-01-07 NOTE — Progress Notes (Signed)
Patient ID: Maurice Sanders, male   DOB: Jan 29, 1946, 72 y.o.   MRN: [id number]  Chief Complaint  Patient presents with  . New Patient (Initial Visit)    Bilateral Pleural Effusions    Referred By Dr. Lennette Bihari Reason for Referral left pleural effusion  HPI Location, Quality, Duration, Severity, Timing, Context, Modifying Factors, Associated Signs and Symptoms.  Maurice Sanders is a 72 y.o. male.  He states that he was in his usual state of health until about 1 month ago when he was diagnosed with pneumonia.  He states that since that time he is always been short of breath and has never been able to completely recover.  He has an occasional nonproductive cough.  He was initially seen by his primary care physician and was placed on antibiotic therapy.  He states he did not really feel much better and came to the emergency room and in the emergency room had a CT scan done which revealed extensive mediastinal adenopathy as well as a left pleural effusion.  The patient was subsequently sent here for further evaluation.  He states that he has had no fevers or chills.  He has not had any weight loss although he states that he has been somewhat nervous and anxious and his appetite is been less than usual.  He has never had a similar instance of this.  He quit smoking many years ago.   Past Medical History:  Diagnosis Date  . Diabetes mellitus without complication (HCC)   . Hyperlipidemia   . Hypertension     History reviewed. No pertinent surgical history.  Family History  Problem Relation Age of Onset  . Diabetes Mother   . Varicose Veins Mother     Social History Social History   Tobacco Use  . Smoking status: Never Smoker  . Smokeless tobacco: Never Used  Substance Use Topics  . Alcohol use: Never    Frequency: Never  . Drug use: Never    No Known Allergies  Current Outpatient Medications  Medication Sig Dispense Refill  . aspirin EC 81 MG tablet Take by mouth.    Marland Kitchen atorvastatin  (LIPITOR) 10 MG tablet Take 1 tab by po at bedtime for cholesterol 90 tablet 1  . clotrimazole-betamethasone (LOTRISONE) cream Apply topically.    . furosemide (LASIX) 40 MG tablet Take by mouth.    Marland Kitchen glimepiride (AMARYL) 2 MG tablet TK 1 T PO D WITH LARGEST MEAL OF THE DAY FOR DIABETES 90 tablet 1  . lisinopril (PRINIVIL,ZESTRIL) 10 MG tablet Take 1 tab po daily 90 tablet 1  . metoCLOPramide (REGLAN) 10 MG tablet Take 1 tablet (10 mg total) by mouth every 8 (eight) hours as needed for nausea. 90 tablet 2  . omeprazole (PRILOSEC) 40 MG capsule Take 40 mg by mouth daily.    Marland Kitchen oxyCODONE-acetaminophen (ROXICET) 5-325 MG tablet Take 1 tablet by mouth every 6 (six) hours as needed for severe pain. 30 tablet 0  . sitaGLIPtin-metformin (JANUMET) 50-1000 MG tablet Take 1 tab by twice daily 180 tablet 1   No current facility-administered medications for this visit.       Review of Systems A complete review of systems was asked and was negative except for the following positive findings shortness of breath, cough, anxiety and depression  Blood pressure (!) 145/84, pulse (!) 102, temperature 97.6 F (36.4 C), temperature source Oral, resp. rate 20, height 5' 11.5" (1.816 m), weight 266 lb 12.8 oz (121 kg), SpO2 93 %.  Physical Exam  CONSTITUTIONAL:  Pleasant, well-developed, well-nourished, and in no acute distress. EYES: Pupils equal and reactive to light, Sclera non-icteric EARS, NOSE, MOUTH AND THROAT:  The oropharynx was clear.  Dentition is good repair.  Oral mucosa pink and moist. LYMPH NODES:  Lymph nodes in the neck and axillae were normal RESPIRATORY:  Lungs were clear.  Normal respiratory effort without pathologic use of accessory muscles of respiration CARDIOVASCULAR: Heart was regular with a loud systolic murmurs.  There were no carotid bruits but there were transmitted aortic murmur to both carotids GI: The abdomen was soft, nontender, and nondistended. There were no palpable masses.  There was no hepatosplenomegaly. There were normal bowel sounds in all quadrants. GU:  Rectal deferred.   MUSCULOSKELETAL:  Normal muscle strength and tone.  No clubbing or cyanosis.   SKIN:  There were no pathologic skin lesions.  There were no nodules on palpation. NEUROLOGIC:  Sensation is normal.  Cranial nerves are grossly intact. PSYCH:  Oriented to person, place and time.  Mood and affect are normal.  Data Reviewed Chest x-ray and CT scan  I have personally reviewed the patient's imaging, laboratory findings and medical records.    Assessment    I have independently reviewed the patient's CT scan.  There is extensive mediastinal adenopathy including subcarinal and paratracheal nodes.  The pleural effusion on the left is somewhat small and does not appear to have any nodularity associated with it.    Plan    I would like to ask Dr. Gabriel Earingavid Costa to see the patient to consider performing EBUS with biopsy.  We will make an appointment for him to follow-up with Dr. Donella StadeKoss and then he will come back to see me in 2 weeks with the biopsy report.      Hulda Marinimothy Ziah Leandro, MD 01/07/2018, 2:09 PM

## 2018-01-07 NOTE — Patient Instructions (Signed)
We will send the referral to [facility]Dr.Kasa's office. Someone from their office will call to schedule an appointment.   I will call you to schedule a follow up appointment with Dr.Oaks.

## 2018-01-08 ENCOUNTER — Ambulatory Visit: Payer: Self-pay | Admitting: Nurse Practitioner

## 2018-01-11 ENCOUNTER — Telehealth: Payer: Self-pay | Admitting: Cardiothoracic Surgery

## 2018-01-11 NOTE — Telephone Encounter (Signed)
Patient is calling about his appointment that he was referred to. Patient states he is short of breath. Please contact patient and advise.

## 2018-01-11 NOTE — Telephone Encounter (Signed)
I have called patient back. I discussed the statement of shortness of breath. He states that his breathing is the same as when he was last seen in the office on 6/6 with Dr Thelma Bargeaks. I did advise him that if he was having increased difficulty to please go to the ER.   Patient has an appointment with Dr Nicholos Johnsamachandran on 6/12 @ 9:45am. Patient was ok with this appointment date and time.

## 2018-01-12 NOTE — Progress Notes (Signed)
[facility] [place] Consultation      Assessment and Plan:  Mediastinal lymphadenopathy.  --Hilar and paratracheal lymphadenopathy of uncertain etiology, differential includes cancer, lymphoma, sarcoidosis, granulomatous disease.  --Patient has trouble laying flat due to dyspnea possible from pleural effusion, will schedule thoracentesis first, then plan for EBUS bronchoscopy 5 to 7 days later.   --Send for ACE level (pt is on ACE inhibitor).   Left Pleural effusion.  --Will plan for left thoracentesis, send for flow cytometry, culture, cytology.   Obstructive sleep apnea.  --Not currently compliant with CPAP due to sinus drainage.  --Does not want to start with CPAP currently, will follow up with Dr. Welton Flakes  Orders Placed This Encounter  Procedures  . US THORACENTESIS ASP PLEURAL SPACE W/IMG GUIDE  . Angiotensin converting enzyme   Meds ordered this encounter  Medications  . fluticasone furoate-vilanterol (BREO ELLIPTA) 200-25 MCG/INH AEPB    Sig: Inhale 1 puff into the lungs daily.    Dispense:  28 each    Refill:  0    Order Specific Question:   Lot Number?    Answer:   [id number]    Order Specific Question:   Expiration Date?    Answer:   08/16/2019    Order Specific Question:   Manufacturer?    Answer:   [employer] [[id number]]    Order Specific Question:   Quantity    Answer:   1   Return in about 1 week (around 01/20/2018) for after EBUS completed.     Date: 01/13/2018  MRN# [id number] Maurice Sanders [birth year]  Referring Physician: Dr. Welton Flakes.   Maurice Sanders is a 72 y.o. old male seen in consultation for chief complaint of:    Chief Complaint  Patient presents with  . Consult    Referred by Dr. Thelma Barge for eval of possible bronchoscopy  . mediastinal adenopathy    sob still after pneumonia x 1 month ago. Patient had CT scan  . Pleural Effusion    left  . Cough    thick white mucus  . Sleep Apnea    managed by Dr. Park Breed    HPI:  The patient is a  72 year old male, he presented to the ED on 12/09/2017 with dyspnea.  Chest x-ray was apparently concerning for pneumonia, therefore was treated with antibiotics.  Patient did not appear to be improved, he was seen by his pulmonologist Dr. Park Breed on 5/16, patient was noted to have pneumonia, effusion, obstructive sleep apnea.  At that time it appeared that he has been treated with doxycycline and had some improvement.  Patient presented again to the ED on 5/18 after being seen in urgent care who had done a chest x-ray which showed non-resolving pneumonia.  Patient felt that the symptoms had significantly improved but continued to have some symptoms.  He was given another course of antibiotics (Levaquin).  Patient went back to see Dr. Lennette Bihari on 6/3, CT scan at that time noted that the patient had mediastinal and neck lymphadenopathy.  He was referred to the surgical service for biopsy potentially of a neck lymph node if not possible patient would likely need a EBUS bronchoscopy. He was seen by Dr. Thelma Barge on 6/6, where it was noted that the patient had significant mediastinal lymphadenopathy, therefore he was referred here for an EBUS bronchoscopy.   He tells me that he can not lay on his side,because he starts coughing.  He snores when he sleeps on his back because he begins to snore and  his wife wakes him (even with CPAP), he has developed sinusitis with the cpap therefore he has stopped using it. He has tried a flonase as well as saline solution but it did not help.  He feels that his breathing has not improved since it got worse about 5 weeks ago. He has been on no inhaler.   Imaging personally reviewed CT chest 12/24/2014, as well as previous chest x-rays.  There is a small to moderate left pleural effusion, he has significant right and left paratracheal lymphadenopathy, right hilar and subcarinal lymphadenopathy    PMHX:   Past Medical History:  Diagnosis Date  . Diabetes mellitus without complication (HCC)    . Hyperlipidemia   . Hypertension    Surgical Hx:  History reviewed. No pertinent surgical history. Family Hx:  Family History  Problem Relation Age of Onset  . Diabetes Mother   . Varicose Veins Mother    Social Hx:   Social History   Tobacco Use  . Smoking status: Former Smoker    Packs/day: 1.00    Years: 30.00    Pack years: 30.00    Types: Cigarettes  . Smokeless tobacco: Never Used  Substance Use Topics  . Alcohol use: Never    Frequency: Never  . Drug use: Never   Medication:    Current Outpatient Medications:  .  aspirin EC 81 MG tablet, Take by mouth., Disp: , Rfl:  .  atorvastatin (LIPITOR) 10 MG tablet, Take 1 tab by po at bedtime for cholesterol, Disp: 90 tablet, Rfl: 1 .  clotrimazole-betamethasone (LOTRISONE) cream, Apply topically., Disp: , Rfl:  .  furosemide (LASIX) 40 MG tablet, Take by mouth., Disp: , Rfl:  .  glimepiride (AMARYL) 2 MG tablet, TK 1 T PO D WITH LARGEST MEAL OF THE DAY FOR DIABETES, Disp: 90 tablet, Rfl: 1 .  lisinopril (PRINIVIL,ZESTRIL) 10 MG tablet, Take 1 tab po daily, Disp: 90 tablet, Rfl: 1 .  metoCLOPramide (REGLAN) 10 MG tablet, Take 1 tablet (10 mg total) by mouth every 8 (eight) hours as needed for nausea., Disp: 90 tablet, Rfl: 2 .  omeprazole (PRILOSEC) 40 MG capsule, Take 40 mg by mouth daily., Disp: , Rfl:  .  oxyCODONE-acetaminophen (ROXICET) 5-325 MG tablet, Take 1 tablet by mouth every 6 (six) hours as needed for severe pain., Disp: 30 tablet, Rfl: 0 .  sitaGLIPtin-metformin (JANUMET) 50-1000 MG tablet, Take 1 tab by twice daily, Disp: 180 tablet, Rfl: 1   Allergies:  Patient has no known allergies.  Review of Systems: Gen:  Denies  fever, sweats, chills HEENT: Denies blurred vision, double vision. bleeds, sore throat Cvc:  No dizziness, chest pain. Resp:   Denies cough or sputum production, shortness of breath Gi: Denies swallowing difficulty, stomach pain. Gu:  Denies bladder incontinence, burning urine Ext:    No Joint pain, stiffness. Skin: No skin rash,  hives  Endoc:  No polyuria, polydipsia. Psych: No depression, insomnia. Other:  All other systems were reviewed with the patient and were negative other that what is mentioned in the HPI.   Physical Examination:   VS: BP 132/86 (BP Location: Left Arm, Cuff Size: Large)   Pulse (!) 101   Resp 16   Ht 5' 11.5" (1.816 m)   Wt 269 lb (122 kg)   SpO2 92%   BMI 36.99 kg/m   General Appearance: No distress  Neuro:without focal findings,  speech normal,  HEENT: PERRLA, EOM intact.   Pulmonary: normal breath sounds, No wheezing.  CardiovascularNormal S1,S2.  No m/r/g.   Abdomen: Benign, Soft, non-tender. Renal:  No costovertebral tenderness  GU:  No performed at this time. Endoc: No evident thyromegaly, no signs of acromegaly. Skin:   warm, no rashes, no ecchymosis  Extremities: normal, no cyanosis, clubbing.  Other findings:    LABORATORY PANEL:   CBC No results for input(s): WBC, HGB, HCT, PLT in the last 168 hours. ------------------------------------------------------------------------------------------------------------------  Chemistries  No results for input(s): NA, K, CL, CO2, GLUCOSE, BUN, CREATININE, CALCIUM, MG, AST, ALT, ALKPHOS, BILITOT in the last 168 hours.  Invalid input(s): GFRCGP ------------------------------------------------------------------------------------------------------------------  Cardiac Enzymes No results for input(s): TROPONINI in the last 168 hours. ------------------------------------------------------------  RADIOLOGY:  No results found.     Thank  you for the consultation and for allowing [facility] to assist in the care of your patient. Our recommendations are noted above.  Please contact us if we can be of further service.   Wells Guiles, MD.  Board Certified in Internal Medicine, Pulmonary Medicine, Critical Care Medicine, and Sleep Medicine.    [place] [facility] [facility] Office Number: [phone]  Santiago Glad, M.D.  Billy Fischer, M.D  01/13/2018

## 2018-01-13 ENCOUNTER — Ambulatory Visit
Admission: RE | Admit: 2018-01-13 | Discharge: 2018-01-13 | Disposition: A | Discharge status: Home / Self Care | Payer: Medicare Other | Source: Ambulatory Visit | Attending: Internal Medicine | Admitting: Internal Medicine

## 2018-01-13 ENCOUNTER — Ambulatory Visit: Payer: Self-pay | Admitting: Internal Medicine

## 2018-01-13 ENCOUNTER — Ambulatory Visit (INDEPENDENT_AMBULATORY_CARE_PROVIDER_SITE_OTHER): Payer: Medicare Other | Admitting: Internal Medicine

## 2018-01-13 ENCOUNTER — Other Ambulatory Visit
Admission: RE | Admit: 2018-01-13 | Discharge: 2018-01-13 | Disposition: A | Discharge status: Home / Self Care | Payer: Medicare Other | Source: Ambulatory Visit | Attending: Internal Medicine | Admitting: Internal Medicine

## 2018-01-13 ENCOUNTER — Encounter: Payer: Self-pay | Admitting: Internal Medicine

## 2018-01-13 ENCOUNTER — Ambulatory Visit
Admission: RE | Admit: 2018-01-13 | Discharge: 2018-01-13 | Disposition: A | Discharge status: Home / Self Care | Payer: Medicare Other | Source: Ambulatory Visit | Attending: Interventional Radiology | Admitting: Interventional Radiology

## 2018-01-13 VITALS — BP 132/86 | HR 101 | Resp 16 | Ht 71.5 in | Wt 269.0 lb

## 2018-01-13 DIAGNOSIS — D869 Sarcoidosis, unspecified: Secondary | ICD-10-CM

## 2018-01-13 DIAGNOSIS — J90 Pleural effusion, not elsewhere classified: Secondary | ICD-10-CM | POA: Diagnosis not present

## 2018-01-13 DIAGNOSIS — R0602 Shortness of breath: Secondary | ICD-10-CM | POA: Diagnosis not present

## 2018-01-13 DIAGNOSIS — R591 Generalized enlarged lymph nodes: Secondary | ICD-10-CM

## 2018-01-13 DIAGNOSIS — G4733 Obstructive sleep apnea (adult) (pediatric): Secondary | ICD-10-CM | POA: Diagnosis not present

## 2018-01-13 LAB — BODY FLUID CELL COUNT WITH DIFFERENTIAL
Eos, Fluid: 0 %
LYMPHS FL: 68 %
MONOCYTE-MACROPHAGE-SEROUS FLUID: 11 %
Neutrophil Count, Fluid: 21 %
Other Cells, Fluid: 0 %
WBC FLUID: 136 uL

## 2018-01-13 LAB — PROTEIN, PLEURAL OR PERITONEAL FLUID: Total protein, fluid: <3 g/dL

## 2018-01-13 LAB — AMYLASE, PLEURAL OR PERITONEAL FLUID: AMYLASE FL: 17 U/L

## 2018-01-13 LAB — LACTATE DEHYDROGENASE, PLEURAL OR PERITONEAL FLUID: LD, Fluid: 68 U/L — ABNORMAL HIGH (ref 3–23)

## 2018-01-13 LAB — ALBUMIN, PLEURAL OR PERITONEAL FLUID: ALBUMIN FL: 1.3 g/dL

## 2018-01-13 MED ORDER — FLUTICASONE FUROATE-VILANTEROL 200-25 MCG/INH IN AEPB: 1.0000 | INHALATION_SPRAY | Freq: Every day | RESPIRATORY_TRACT | 0 refills | Status: AC

## 2018-01-13 NOTE — Patient Instructions (Addendum)
Will plan for blood work.  Will plan for fluid drainage from your lung, and for bronchoscopy biopsy of your lung a week later.  Will give you a sample of an inhaler, it it helps call us for a prescription.

## 2018-01-13 NOTE — Procedures (Signed)
Left eff  S/p US LT THORA  No comp Stable 1.4 L bloody pl fld removed Full report in pacs Labs sent

## 2018-01-14 LAB — PROTEIN, BODY FLUID (OTHER): Total Protein, Body Fluid Other: 2 g/dL

## 2018-01-14 LAB — ACID FAST SMEAR (AFB, MYCOBACTERIA)

## 2018-01-14 LAB — ACID FAST SMEAR (AFB): ACID FAST SMEAR - AFSCU2: NEGATIVE

## 2018-01-14 LAB — ANGIOTENSIN CONVERTING ENZYME: Angiotensin-Converting Enzyme: 28 U/L (ref 14–82)

## 2018-01-15 ENCOUNTER — Other Ambulatory Visit: Payer: Self-pay

## 2018-01-15 LAB — COMP PANEL: LEUKEMIA/LYMPHOMA

## 2018-01-17 LAB — CHOLESTEROL, BODY FLUID: Cholesterol, Fluid: 21 mg/dL

## 2018-01-17 LAB — BODY FLUID CULTURE: CULTURE: NO GROWTH

## 2018-01-18 ENCOUNTER — Telehealth: Payer: Self-pay

## 2018-01-18 NOTE — Telephone Encounter (Signed)
Left message for patient to return call regarding appointment below.  Dr.Oaks 02/08/18 @ 10:00 am. Patient seeing Dr.Rhamacondran 01/26/18.

## 2018-01-18 NOTE — Telephone Encounter (Signed)
Patient notied of follow up appointment with Dr.Oaks 02/08/18.

## 2018-01-19 ENCOUNTER — Telehealth: Payer: Self-pay

## 2018-01-19 ENCOUNTER — Other Ambulatory Visit: Payer: Self-pay

## 2018-01-19 ENCOUNTER — Ambulatory Visit: Payer: Self-pay | Admitting: Nurse Practitioner

## 2018-01-21 ENCOUNTER — Ambulatory Visit: Payer: Self-pay | Admitting: Internal Medicine

## 2018-01-22 NOTE — Telephone Encounter (Signed)
Patient deceased. Maurice Sanders

## 2018-01-26 ENCOUNTER — Ambulatory Visit: Payer: Self-pay | Admitting: Internal Medicine

## 2018-01-27 ENCOUNTER — Telehealth: Payer: Self-pay

## 2018-01-27 NOTE — Telephone Encounter (Signed)
Hoy FinlayJim Foster from funeral home was called to be informed that pt death certificate was signed and ready to be picked up.

## 2018-02-01 DEATH — deceased

## 2018-02-08 ENCOUNTER — Ambulatory Visit: Payer: Self-pay | Admitting: Cardiothoracic Surgery

## 2018-02-25 LAB — ACID FAST CULTURE WITH REFLEXED SENSITIVITIES: ACID FAST CULTURE - AFSCU3: NEGATIVE

## 2018-02-25 LAB — ACID FAST CULTURE WITH REFLEXED SENSITIVITIES (MYCOBACTERIA)

## 2018-06-15 ENCOUNTER — Encounter: Payer: Self-pay | Admitting: Nurse Practitioner

## 2018-07-05 ENCOUNTER — Ambulatory Visit (INDEPENDENT_AMBULATORY_CARE_PROVIDER_SITE_OTHER): Payer: Self-pay | Admitting: Vascular Surgery

## 2019-10-19 IMAGING — CT CT CHEST W/ CM
2 of 3 series · 15 of 36 positions shown, 18 images · IV contrast (iopamidol)
Comparison: Chest radiograph 12/19/2017.

CLINICAL DATA: Persistent pneumonia.  Weakness and fatigue.

EXAM:
CT CHEST WITH CONTRAST
TECHNIQUE: Multidetector CT imaging of the chest was performed during
intravenous contrast administration.
CONTRAST:  75mL CFKKR7-MZY IOPAMIDOL (CFKKR7-MZY) INJECTION 76%

[Series 2: axial st · axial · 0.71mm/px · z∈[-328,-60]mm · 12 of 158 slices shown, 15 images]
[im 12/158  mediastinal]
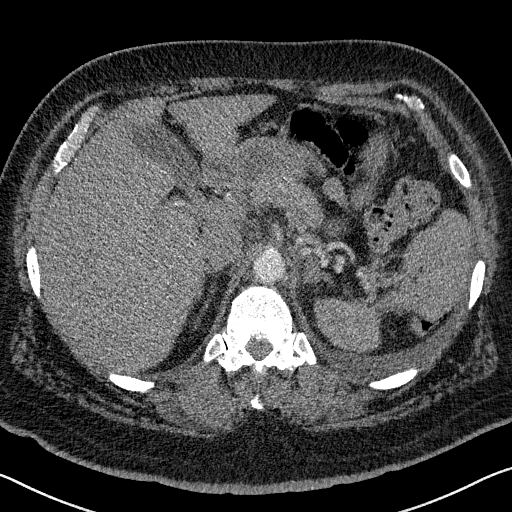
[im 12/158  lung]
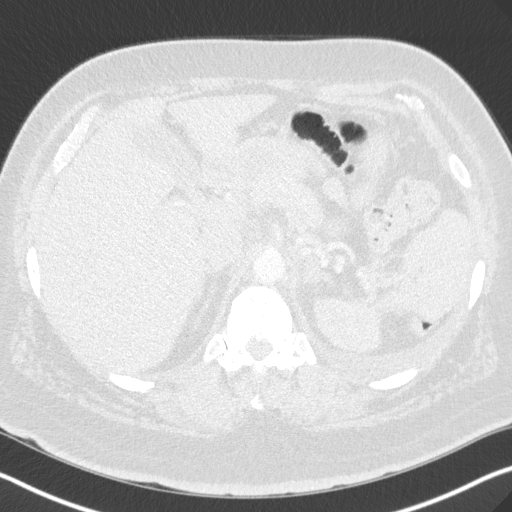
[im 24/158  lung]
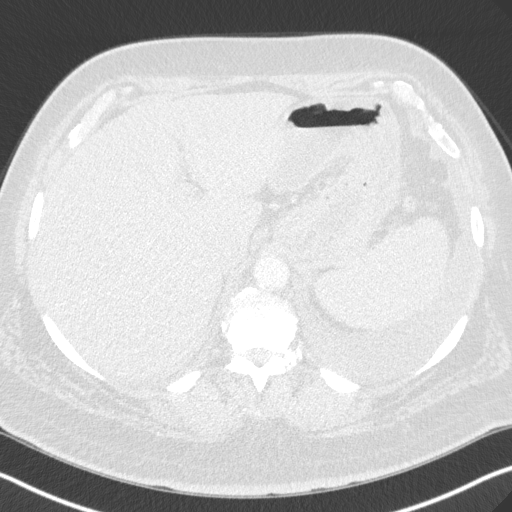
[im 35/158  lung]
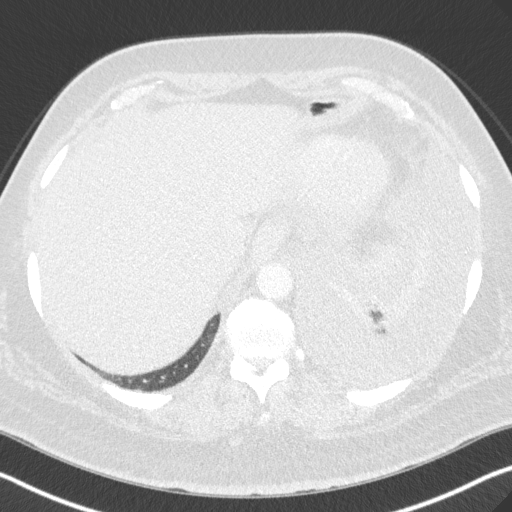
[im 47/158  lung]
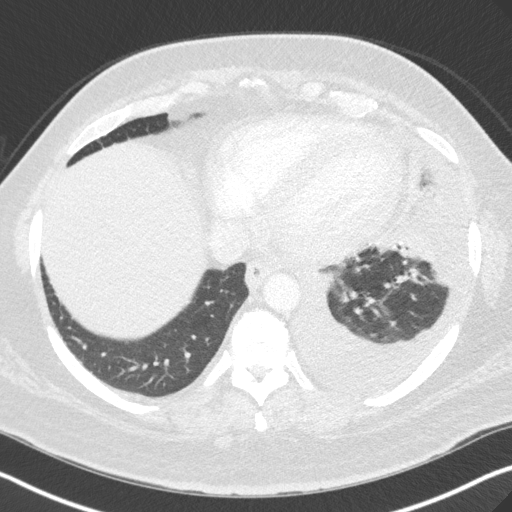
[im 59/158  mediastinal]
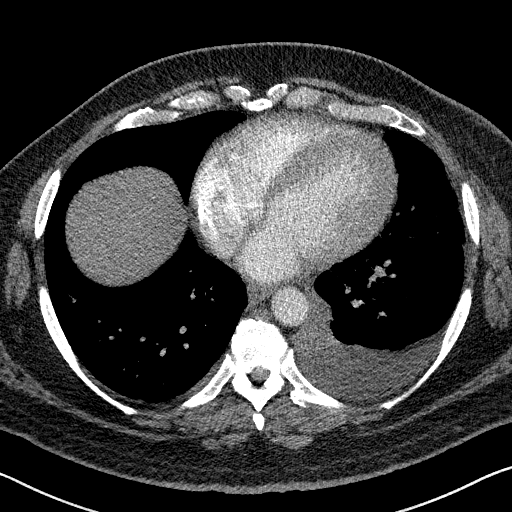
[im 59/158  lung]
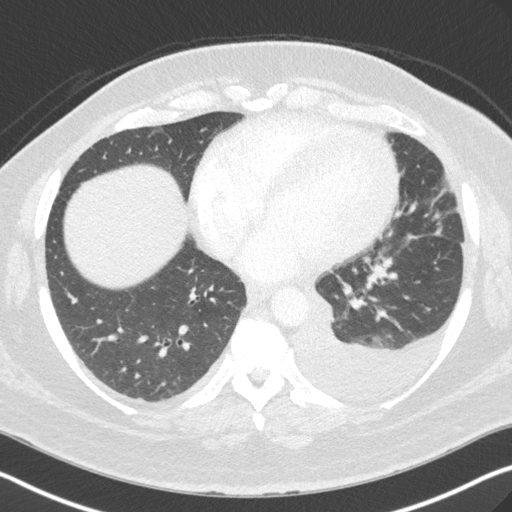
[im 70/158  lung]
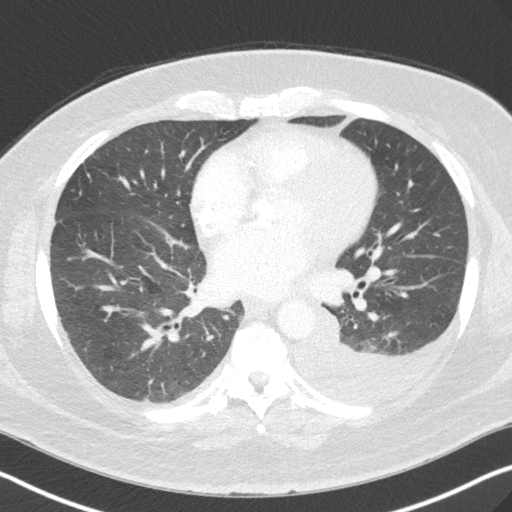
[im 88/158  lung]
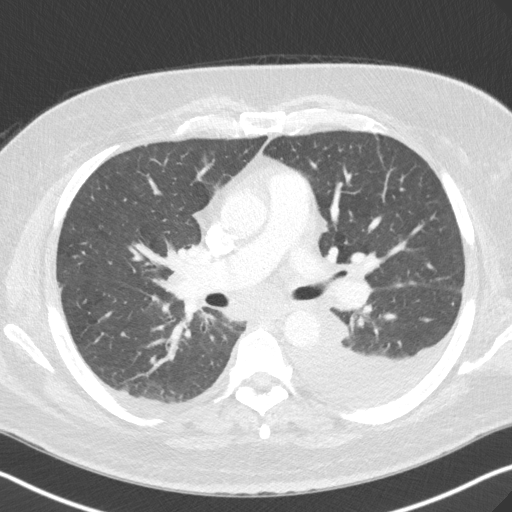
[im 99/158  lung]
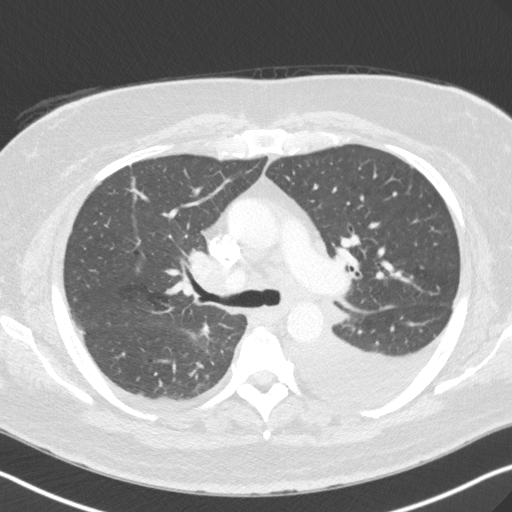
[im 111/158  mediastinal]
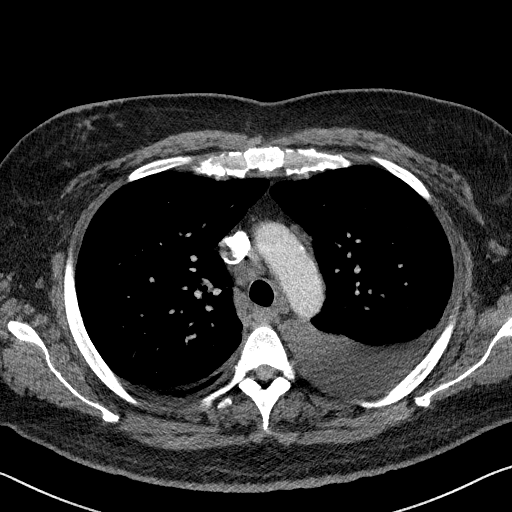
[im 111/158  lung]
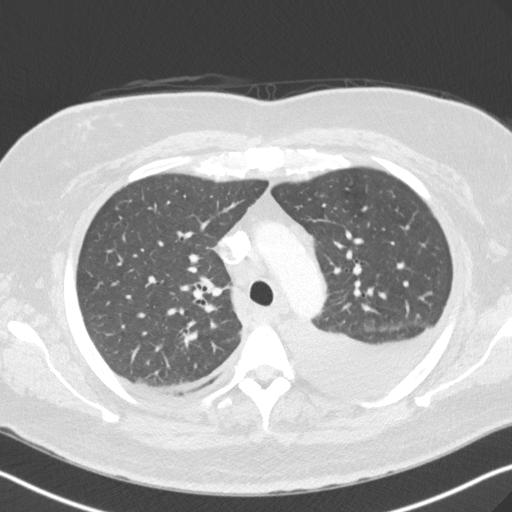
[im 123/158  lung]
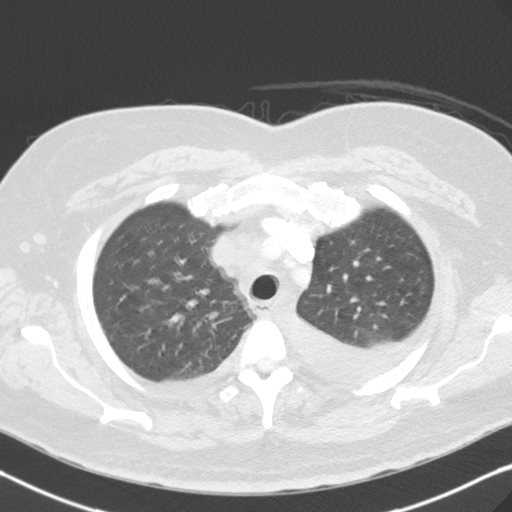
[im 134/158  lung]
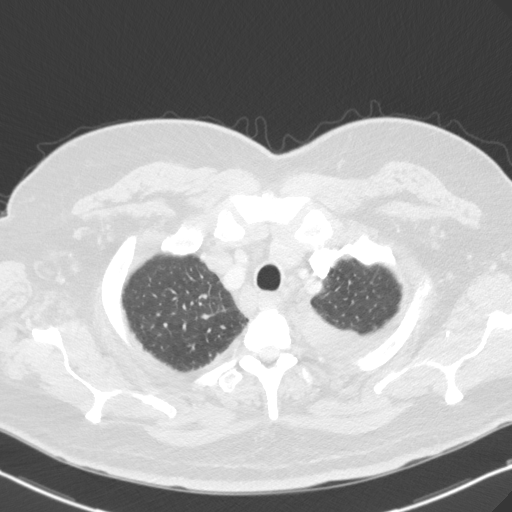
[im 146/158  lung]
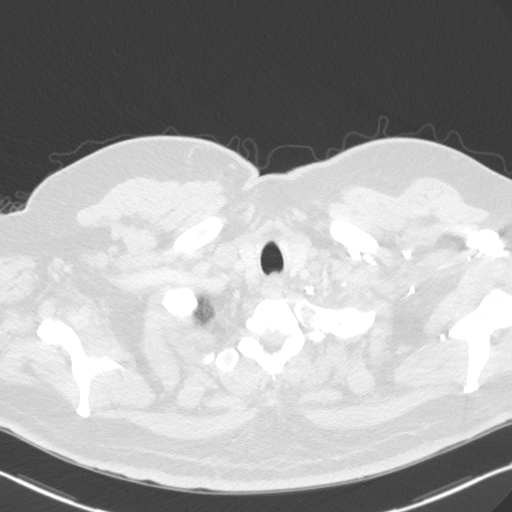

[Series 5: coronal · coronal · 0.67mm/px · 3 of 151 slices shown]
[im 31/151  lung]
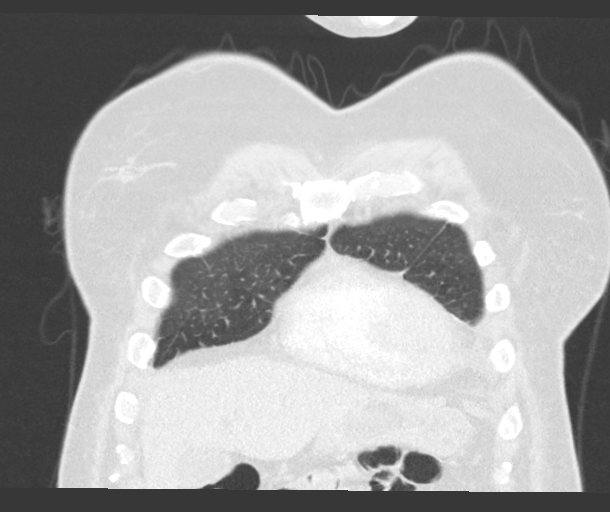
[im 61/151  lung]
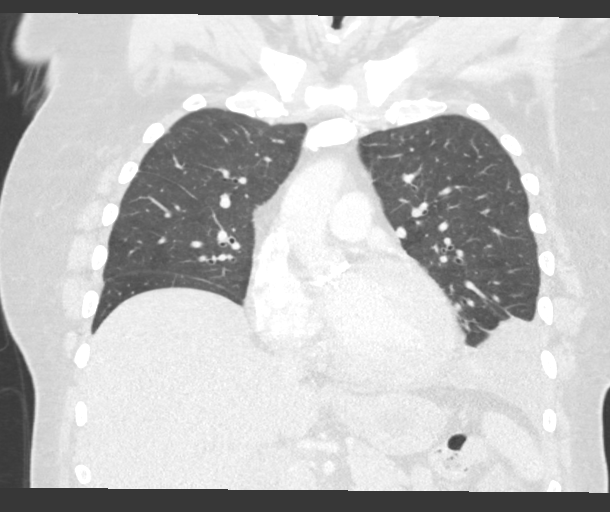
[im 91/151  lung]
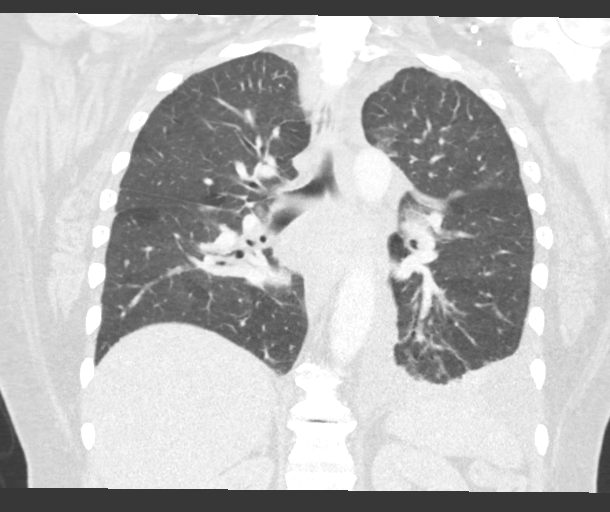

[15 of 36 positions shown; findings below may reference images not displayed]

FINDINGS: Cardiovascular: Atherosclerotic calcification of the arterial
vasculature, including coronary arteries and aortic valve. Heart is
mildly enlarged. No pericardial effusion.

Mediastinum/Nodes: Low internal jugular lymph nodes measure up to 10
mm on the left. Numerous enlarged mediastinal lymph nodes measure up
to 1.7 cm in the low right paratracheal station. Subcarinal lymph
node measures 2.0 cm. Bihilar adenopathy measures up to 1.6 cm on
the right. No axillary adenopathy. Esophagus is unremarkable.

Lungs/Pleura: Image quality is degraded by respiratory motion.
Moderate left pleural effusion with compressive atelectasis in the
lingula and left lower lobe. Trace right pleural effusion. Minimal
dependent atelectasis in the right lower lobe. Scattered millimetric
pulmonary nodules measure 4 mm or less in size. Airway is
unremarkable.

Upper Abdomen: Visualized portions of the liver, gallbladder and
right adrenal gland are unremarkable. Nodular thickening of the left
adrenal gland. Visualized portions of the kidneys, spleen, pancreas,
stomach and bowel are grossly unremarkable. No upper abdominal
adenopathy.

Musculoskeletal: Degenerative changes in the spine.
IMPRESSION: 1. Low internal jugular, mediastinal and hilar adenopathy, worrisome
for lymphoma or metastatic disease.
2. Bilateral pleural effusions, left greater than right. Associated
compressive atelectasis in the lingula and left lower lobe.
3. Millimetric pulmonary nodules. Continued attention on follow-up
exams is warranted.
4. Aortic atherosclerosis (28IAU-170.0). Coronary artery
calcification.
